# Patient Record
Sex: Female | Born: 1940 | Race: White | Hispanic: No | Marital: Married | Smoking: Never smoker
Health system: Southern US, Community
[De-identification: ages and names within clinical notes are randomized; demographics above are authoritative.]

## PROBLEM LIST (undated history)

## (undated) DIAGNOSIS — T4145XA Adverse effect of unspecified anesthetic, initial encounter: Secondary | ICD-10-CM

## (undated) DIAGNOSIS — I10 Essential (primary) hypertension: Secondary | ICD-10-CM

## (undated) DIAGNOSIS — R569 Unspecified convulsions: Secondary | ICD-10-CM

## (undated) DIAGNOSIS — R51 Headache: Secondary | ICD-10-CM

## (undated) DIAGNOSIS — K219 Gastro-esophageal reflux disease without esophagitis: Secondary | ICD-10-CM

## (undated) DIAGNOSIS — G4730 Sleep apnea, unspecified: Secondary | ICD-10-CM

## (undated) DIAGNOSIS — N189 Chronic kidney disease, unspecified: Secondary | ICD-10-CM

## (undated) DIAGNOSIS — C801 Malignant (primary) neoplasm, unspecified: Secondary | ICD-10-CM

## (undated) DIAGNOSIS — R519 Headache, unspecified: Secondary | ICD-10-CM

## (undated) DIAGNOSIS — M1990 Unspecified osteoarthritis, unspecified site: Secondary | ICD-10-CM

## (undated) DIAGNOSIS — E785 Hyperlipidemia, unspecified: Secondary | ICD-10-CM

## (undated) DIAGNOSIS — D649 Anemia, unspecified: Secondary | ICD-10-CM

## (undated) DIAGNOSIS — R52 Pain, unspecified: Secondary | ICD-10-CM

## (undated) DIAGNOSIS — E119 Type 2 diabetes mellitus without complications: Secondary | ICD-10-CM

## (undated) DIAGNOSIS — T8859XA Other complications of anesthesia, initial encounter: Secondary | ICD-10-CM

## (undated) DIAGNOSIS — M5090 Cervical disc disorder, unspecified, unspecified cervical region: Secondary | ICD-10-CM

## (undated) DIAGNOSIS — L719 Rosacea, unspecified: Secondary | ICD-10-CM

## (undated) HISTORY — PX: BACK SURGERY: SHX140

## (undated) HISTORY — PX: OTHER SURGICAL HISTORY: SHX169

## (undated) HISTORY — PX: BREAST SURGERY: SHX581

## (undated) HISTORY — PX: ABDOMINAL HYSTERECTOMY: SHX81

## (undated) HISTORY — PX: CHOLECYSTECTOMY: SHX55

## (undated) HISTORY — PX: SKIN CANCER EXCISION: SHX779

## (undated) HISTORY — DX: Type 2 diabetes mellitus without complications: E11.9

---

## 1983-08-12 HISTORY — PX: REDUCTION MAMMAPLASTY: SUR839

## 2005-04-24 ENCOUNTER — Ambulatory Visit: Payer: Self-pay | Admitting: Obstetrics and Gynecology

## 2005-07-11 ENCOUNTER — Ambulatory Visit: Payer: Self-pay | Admitting: Psychiatry

## 2005-07-16 ENCOUNTER — Ambulatory Visit: Payer: Self-pay | Admitting: Psychiatry

## 2005-07-24 ENCOUNTER — Ambulatory Visit: Payer: Self-pay | Admitting: Psychiatry

## 2006-02-02 ENCOUNTER — Emergency Department: Payer: Self-pay | Admitting: Emergency Medicine

## 2006-02-06 ENCOUNTER — Other Ambulatory Visit: Payer: Self-pay

## 2006-02-06 ENCOUNTER — Ambulatory Visit: Payer: Self-pay | Admitting: Otolaryngology

## 2006-02-12 ENCOUNTER — Ambulatory Visit: Payer: Self-pay | Admitting: Otolaryngology

## 2006-04-26 ENCOUNTER — Ambulatory Visit: Payer: Self-pay | Admitting: Psychiatry

## 2006-04-27 ENCOUNTER — Ambulatory Visit: Payer: Self-pay | Admitting: Psychiatry

## 2006-05-27 ENCOUNTER — Ambulatory Visit: Payer: Self-pay | Admitting: Obstetrics and Gynecology

## 2006-12-12 ENCOUNTER — Encounter: Admission: RE | Admit: 2006-12-12 | Discharge: 2006-12-12 | Payer: Self-pay | Admitting: Orthopedic Surgery

## 2006-12-24 ENCOUNTER — Ambulatory Visit (HOSPITAL_BASED_OUTPATIENT_CLINIC_OR_DEPARTMENT_OTHER): Admission: RE | Admit: 2006-12-24 | Discharge: 2006-12-24 | Payer: Self-pay | Admitting: Orthopedic Surgery

## 2007-07-09 ENCOUNTER — Ambulatory Visit: Payer: Self-pay | Admitting: Obstetrics and Gynecology

## 2008-03-21 ENCOUNTER — Ambulatory Visit: Payer: Self-pay | Admitting: Obstetrics and Gynecology

## 2009-07-23 ENCOUNTER — Ambulatory Visit: Payer: Self-pay | Admitting: Internal Medicine

## 2009-07-25 ENCOUNTER — Ambulatory Visit: Payer: Self-pay | Admitting: Unknown Physician Specialty

## 2009-09-11 ENCOUNTER — Ambulatory Visit: Payer: Self-pay | Admitting: Unknown Physician Specialty

## 2009-09-17 ENCOUNTER — Ambulatory Visit: Payer: Self-pay | Admitting: Unknown Physician Specialty

## 2009-10-18 ENCOUNTER — Encounter: Payer: Self-pay | Admitting: Unknown Physician Specialty

## 2009-11-09 ENCOUNTER — Encounter: Payer: Self-pay | Admitting: Unknown Physician Specialty

## 2009-12-09 ENCOUNTER — Encounter: Payer: Self-pay | Admitting: Unknown Physician Specialty

## 2010-03-04 ENCOUNTER — Ambulatory Visit: Payer: Self-pay | Admitting: Unknown Physician Specialty

## 2010-06-21 ENCOUNTER — Ambulatory Visit: Payer: Self-pay | Admitting: Cardiology

## 2010-07-29 ENCOUNTER — Ambulatory Visit: Payer: Self-pay | Admitting: Internal Medicine

## 2010-09-23 ENCOUNTER — Ambulatory Visit: Payer: Self-pay | Admitting: Unknown Physician Specialty

## 2011-07-07 ENCOUNTER — Ambulatory Visit: Payer: Self-pay | Admitting: Unknown Physician Specialty

## 2011-07-31 ENCOUNTER — Ambulatory Visit: Payer: Self-pay | Admitting: Internal Medicine

## 2011-12-31 DIAGNOSIS — M47812 Spondylosis without myelopathy or radiculopathy, cervical region: Secondary | ICD-10-CM | POA: Insufficient documentation

## 2011-12-31 DIAGNOSIS — M542 Cervicalgia: Secondary | ICD-10-CM | POA: Insufficient documentation

## 2012-03-25 DIAGNOSIS — M25519 Pain in unspecified shoulder: Secondary | ICD-10-CM | POA: Insufficient documentation

## 2012-04-27 DIAGNOSIS — M47812 Spondylosis without myelopathy or radiculopathy, cervical region: Secondary | ICD-10-CM | POA: Insufficient documentation

## 2012-09-13 ENCOUNTER — Ambulatory Visit: Payer: Self-pay | Admitting: Internal Medicine

## 2012-12-07 ENCOUNTER — Ambulatory Visit: Payer: Self-pay | Admitting: Internal Medicine

## 2012-12-09 ENCOUNTER — Ambulatory Visit: Payer: Self-pay | Admitting: Internal Medicine

## 2013-01-09 ENCOUNTER — Ambulatory Visit: Payer: Self-pay | Admitting: Internal Medicine

## 2013-11-18 DIAGNOSIS — E785 Hyperlipidemia, unspecified: Secondary | ICD-10-CM | POA: Insufficient documentation

## 2013-11-18 DIAGNOSIS — E119 Type 2 diabetes mellitus without complications: Secondary | ICD-10-CM | POA: Insufficient documentation

## 2013-11-18 DIAGNOSIS — G40909 Epilepsy, unspecified, not intractable, without status epilepticus: Secondary | ICD-10-CM | POA: Insufficient documentation

## 2013-11-18 DIAGNOSIS — G4733 Obstructive sleep apnea (adult) (pediatric): Secondary | ICD-10-CM | POA: Insufficient documentation

## 2013-11-18 DIAGNOSIS — I10 Essential (primary) hypertension: Secondary | ICD-10-CM | POA: Insufficient documentation

## 2013-12-26 DIAGNOSIS — M47817 Spondylosis without myelopathy or radiculopathy, lumbosacral region: Secondary | ICD-10-CM | POA: Insufficient documentation

## 2013-12-26 DIAGNOSIS — M169 Osteoarthritis of hip, unspecified: Secondary | ICD-10-CM | POA: Insufficient documentation

## 2013-12-26 DIAGNOSIS — M5136 Other intervertebral disc degeneration, lumbar region: Secondary | ICD-10-CM | POA: Insufficient documentation

## 2013-12-26 DIAGNOSIS — M47816 Spondylosis without myelopathy or radiculopathy, lumbar region: Secondary | ICD-10-CM | POA: Insufficient documentation

## 2014-01-13 ENCOUNTER — Ambulatory Visit: Payer: Self-pay | Admitting: Internal Medicine

## 2014-03-31 ENCOUNTER — Ambulatory Visit (INDEPENDENT_AMBULATORY_CARE_PROVIDER_SITE_OTHER): Payer: Medicare Other

## 2014-03-31 ENCOUNTER — Encounter: Payer: Self-pay | Admitting: Podiatry

## 2014-03-31 ENCOUNTER — Ambulatory Visit (INDEPENDENT_AMBULATORY_CARE_PROVIDER_SITE_OTHER): Payer: Medicare Other | Admitting: Podiatry

## 2014-03-31 ENCOUNTER — Ambulatory Visit: Payer: Self-pay | Admitting: Podiatry

## 2014-03-31 ENCOUNTER — Other Ambulatory Visit: Payer: Self-pay | Admitting: *Deleted

## 2014-03-31 VITALS — BP 128/68 | HR 48 | Resp 16 | Ht 59.0 in | Wt 146.0 lb

## 2014-03-31 DIAGNOSIS — M779 Enthesopathy, unspecified: Secondary | ICD-10-CM

## 2014-03-31 DIAGNOSIS — M19079 Primary osteoarthritis, unspecified ankle and foot: Secondary | ICD-10-CM

## 2014-03-31 DIAGNOSIS — M19071 Primary osteoarthritis, right ankle and foot: Secondary | ICD-10-CM

## 2014-03-31 MED ORDER — TRIAMCINOLONE ACETONIDE 10 MG/ML IJ SUSP
10.0000 mg | Freq: Once | INTRAMUSCULAR | Status: AC
  Administered 2014-03-31: 10 mg

## 2014-04-02 NOTE — Progress Notes (Signed)
Subjective:     Patient ID: [name] [name], female   DOB: July 09, 1941, 73 y.o.   MRN: [id number]  HPI patient points to the medial side of the right foot stating that this is been bothering me for a while and I've also noted sharp pains that at times extends into my big toe   Review of Systems     Objective:   Physical Exam  Neurovascular status intact with muscle strength adequate and discomfort at the medial malleolus around the posterior tibial tendon with inflammation and fluid buildup noted upon testing muscle strength found to be adequate and noted minimal discomfort in the right big toe    Assessment:      probable tendinitis with no current indication of muscle tear    Plan:     Reviewed condition and at this time did a careful sheath injection right posterior tibial tendon and advised on reduced activity. Reappoint her recheck if symptoms persist

## 2014-04-07 ENCOUNTER — Encounter: Payer: Self-pay | Admitting: Podiatry

## 2014-04-07 ENCOUNTER — Ambulatory Visit (INDEPENDENT_AMBULATORY_CARE_PROVIDER_SITE_OTHER): Payer: Medicare Other | Admitting: Podiatry

## 2014-04-07 DIAGNOSIS — M779 Enthesopathy, unspecified: Secondary | ICD-10-CM

## 2014-04-07 MED ORDER — TRIAMCINOLONE ACETONIDE 10 MG/ML IJ SUSP
10.0000 mg | Freq: Once | INTRAMUSCULAR | Status: AC
  Administered 2014-04-07: 10 mg

## 2014-04-07 NOTE — Progress Notes (Signed)
Subjective:     Patient ID: [name] [name], female   DOB: 04-Aug-1941, 73 y.o.   MRN: [id number]  HPI patient states the top of the foot feels a lot better but the inside of the foot bothers her some   Review of Systems     Objective:   Physical Exam Neurovascular status unchanged with inflammation just distal to the navicular insertion on the right foot with fluid buildup noted    Assessment:     Tendinitis of the right posterior tib and distal to this noted but improved    Plan:     Injected distal to the previous injection 3 mg Kenalog 5 mg Xylocaine Marcaine mixture and advised him reduced activity. Reappoint as needed

## 2014-05-23 ENCOUNTER — Ambulatory Visit: Payer: Self-pay | Admitting: Unknown Physician Specialty

## 2014-07-05 ENCOUNTER — Ambulatory Visit: Payer: Self-pay | Admitting: Unknown Physician Specialty

## 2014-09-15 ENCOUNTER — Ambulatory Visit: Payer: Self-pay | Admitting: Unknown Physician Specialty

## 2014-09-15 DIAGNOSIS — R197 Diarrhea, unspecified: Secondary | ICD-10-CM | POA: Insufficient documentation

## 2014-09-15 LAB — CLOSTRIDIUM DIFFICILE(ARMC)

## 2014-09-18 ENCOUNTER — Ambulatory Visit: Payer: Self-pay | Admitting: Unknown Physician Specialty

## 2014-09-19 ENCOUNTER — Ambulatory Visit: Payer: Self-pay | Admitting: Unknown Physician Specialty

## 2014-09-22 DIAGNOSIS — R262 Difficulty in walking, not elsewhere classified: Secondary | ICD-10-CM | POA: Insufficient documentation

## 2014-09-28 ENCOUNTER — Ambulatory Visit: Payer: Self-pay | Admitting: Pain Medicine

## 2014-10-24 ENCOUNTER — Ambulatory Visit: Payer: Self-pay

## 2014-10-24 HISTORY — PX: BLEPHAROPLASTY: SUR158

## 2015-02-07 DIAGNOSIS — M25511 Pain in right shoulder: Secondary | ICD-10-CM

## 2015-02-07 DIAGNOSIS — G8929 Other chronic pain: Secondary | ICD-10-CM | POA: Insufficient documentation

## 2015-02-28 ENCOUNTER — Other Ambulatory Visit: Payer: Self-pay | Admitting: Unknown Physician Specialty

## 2015-02-28 DIAGNOSIS — G8929 Other chronic pain: Secondary | ICD-10-CM

## 2015-02-28 DIAGNOSIS — M25511 Pain in right shoulder: Principal | ICD-10-CM

## 2015-03-06 ENCOUNTER — Ambulatory Visit
Admission: RE | Admit: 2015-03-06 | Discharge: 2015-03-06 | Disposition: A | Discharge status: Home / Self Care | Payer: Medicare Other | Source: Ambulatory Visit | Attending: Unknown Physician Specialty | Admitting: Unknown Physician Specialty

## 2015-03-06 DIAGNOSIS — M7551 Bursitis of right shoulder: Secondary | ICD-10-CM | POA: Insufficient documentation

## 2015-03-06 DIAGNOSIS — G8929 Other chronic pain: Secondary | ICD-10-CM

## 2015-03-06 DIAGNOSIS — M25511 Pain in right shoulder: Secondary | ICD-10-CM | POA: Insufficient documentation

## 2015-05-23 ENCOUNTER — Other Ambulatory Visit: Payer: Self-pay | Admitting: Physician Assistant

## 2015-05-23 DIAGNOSIS — N644 Mastodynia: Secondary | ICD-10-CM

## 2015-05-23 DIAGNOSIS — R0789 Other chest pain: Secondary | ICD-10-CM

## 2015-05-24 ENCOUNTER — Other Ambulatory Visit: Payer: Self-pay | Admitting: Physician Assistant

## 2015-05-24 DIAGNOSIS — R0789 Other chest pain: Secondary | ICD-10-CM

## 2015-05-24 DIAGNOSIS — N644 Mastodynia: Secondary | ICD-10-CM

## 2015-06-05 ENCOUNTER — Encounter
Admission: RE | Admit: 2015-06-05 | Discharge: 2015-06-05 | Disposition: A | Discharge status: Home / Self Care | Payer: Medicare Other | Source: Ambulatory Visit | Attending: Unknown Physician Specialty | Admitting: Unknown Physician Specialty

## 2015-06-05 DIAGNOSIS — N644 Mastodynia: Secondary | ICD-10-CM | POA: Diagnosis not present

## 2015-06-05 DIAGNOSIS — R0789 Other chest pain: Secondary | ICD-10-CM | POA: Diagnosis not present

## 2015-06-05 HISTORY — DX: Unspecified osteoarthritis, unspecified site: M19.90

## 2015-06-05 HISTORY — DX: Malignant (primary) neoplasm, unspecified: C80.1

## 2015-06-05 HISTORY — DX: Headache: R51

## 2015-06-05 HISTORY — DX: Other complications of anesthesia, initial encounter: T88.59XA

## 2015-06-05 HISTORY — DX: Unspecified convulsions: R56.9

## 2015-06-05 HISTORY — DX: Sleep apnea, unspecified: G47.30

## 2015-06-05 HISTORY — DX: Essential (primary) hypertension: I10

## 2015-06-05 HISTORY — DX: Headache, unspecified: R51.9

## 2015-06-05 HISTORY — DX: Anemia, unspecified: D64.9

## 2015-06-05 HISTORY — DX: Cervical disc disorder, unspecified, unspecified cervical region: M50.90

## 2015-06-05 HISTORY — DX: Adverse effect of unspecified anesthetic, initial encounter: T41.45XA

## 2015-06-05 HISTORY — DX: Gastro-esophageal reflux disease without esophagitis: K21.9

## 2015-06-05 HISTORY — DX: Pain, unspecified: R52

## 2015-06-05 HISTORY — DX: Chronic kidney disease, unspecified: N18.9

## 2015-06-05 HISTORY — DX: Rosacea, unspecified: L71.9

## 2015-06-05 NOTE — Patient Instructions (Signed)
  Your procedure is scheduled on: 06/20/15 Report to Day Surgery. [facility] To find out your arrival time please call [phone] between 1PM - 3PM on 06/19/15.  Remember: Instructions that are not followed completely may result in serious medical risk, up to and including death, or upon the discretion of your surgeon and anesthesiologist your surgery may need to be rescheduled.    _X___ 1. Do not eat food or drink liquids after midnight. No gum chewing or hard candies.     __X__ 2. No Alcohol for 24 hours before or after surgery.   ____ 3. Bring all medications with you on the day of surgery if instructed.    _X___ 4. Notify your doctor if there is any change in your medical condition     (cold, fever, infections).     Do not wear jewelry, make-up, hairpins, clips or nail polish.  Do not wear lotions, powders, or perfumes. You may wear deodorant.  Do not shave 48 hours prior to surgery. Men may shave face and neck.  Do not bring valuables to the hospital.    [facility] is not responsible for any belongings or valuables.               Contacts, dentures or bridgework may not be worn into surgery.  Leave your suitcase in the car. After surgery it may be brought to your room.  For patients admitted to the hospital, discharge time is determined by your                treatment team.   Patients discharged the day of surgery will not be allowed to drive home.   Please read over the following fact sheets that you were given:   Surgical Site Infection Prevention   ____ Take these medicines the morning of surgery with A SIP OF WATER:    1. OMEPRAZOLE  2. GABAPENTIN  3.ATENOLOL  4.  5.  6.  ____ Fleet Enema (as directed)   __X__ Use CHG Soap as directed  ____ Use inhalers on the day of surgery  ____ Stop metformin 2 days prior to surgery    ____ Take 1/2 of usual insulin dose the night before surgery and none on the morning of surgery.   ____ Stop Coumadin/Plavix/aspirin  on ____ Stop Anti-inflammatories on    ____ Stop supplements until after surgery.    _X___ Bring C-Pap to the hospital.

## 2015-06-06 ENCOUNTER — Ambulatory Visit
Admission: RE | Admit: 2015-06-06 | Discharge: 2015-06-06 | Disposition: A | Discharge status: Home / Self Care | Payer: Medicare Other | Source: Ambulatory Visit | Attending: Physician Assistant | Admitting: Physician Assistant

## 2015-06-06 DIAGNOSIS — N644 Mastodynia: Secondary | ICD-10-CM

## 2015-06-06 DIAGNOSIS — R0789 Other chest pain: Secondary | ICD-10-CM | POA: Insufficient documentation

## 2015-06-20 ENCOUNTER — Encounter: Payer: Self-pay | Admitting: *Deleted

## 2015-06-20 ENCOUNTER — Ambulatory Visit
Admission: RE | Admit: 2015-06-20 | Discharge: 2015-06-20 | Disposition: A | Discharge status: Home / Self Care | Payer: Medicare Other | Source: Ambulatory Visit | Attending: Unknown Physician Specialty | Admitting: Unknown Physician Specialty

## 2015-06-20 ENCOUNTER — Encounter
Admission: RE | Disposition: A | Discharge status: Home / Self Care | Payer: Self-pay | Source: Ambulatory Visit | Attending: Unknown Physician Specialty

## 2015-06-20 DIAGNOSIS — Z5309 Procedure and treatment not carried out because of other contraindication: Secondary | ICD-10-CM | POA: Insufficient documentation

## 2015-06-20 LAB — GLUCOSE, CAPILLARY: Glucose-Capillary: 111 mg/dL — ABNORMAL HIGH (ref 65–99)

## 2015-06-20 SURGERY — SHOULDER ARTHROSCOPY WITH ROTATOR CUFF REPAIR AND SUBACROMIAL DECOMPRESSION
Anesthesia: Choice | Laterality: Right

## 2015-06-20 MED ORDER — SODIUM CHLORIDE 0.9 % IV SOLN: INTRAVENOUS | Status: DC

## 2015-06-20 SURGICAL SUPPLY — 71 items
ADAPTER IRRIG TUBE 2 SPIKE SOL (ADAPTER) ×1 IMPLANT
ADPR TBG 2 SPK PMP STRL ASCP (ADAPTER)
ARTHROWAND PARAGON T2 (SURGICAL WAND)
BLADE ABRADER 4.5 (BLADE) ×1 IMPLANT
BLADE SHAVER 4.5X7 STR FR (MISCELLANEOUS) ×1 IMPLANT
BLADE SURG 15 STRL LF DISP TIS (BLADE) IMPLANT
BLADE SURG 15 STRL SS (BLADE)
BUR ABRADER 5.5 BLK (MISCELLANEOUS) ×1 IMPLANT
BUR BR 5.5 WIDE MOUTH (BURR) ×1 IMPLANT
BURR ABRADER 5.5 BLK (MISCELLANEOUS)
CANNULA 8.5X75 THRED (CANNULA) IMPLANT
CANNULA SHAVER 8MMX76MM (CANNULA) IMPLANT
CAP LOCK ULTRA CANNULA (MISCELLANEOUS) IMPLANT
CHLORAPREP W/TINT 26ML (MISCELLANEOUS) ×2 IMPLANT
DRAPE STERI 35X30 U-POUCH (DRAPES) ×1 IMPLANT
GAUZE SPONGE 4X4 12PLY STRL (GAUZE/BANDAGES/DRESSINGS) ×1 IMPLANT
GLOVE BIO SURGEON STRL SZ7.5 (GLOVE) ×1 IMPLANT
GLOVE BIO SURGEON STRL SZ8 (GLOVE) ×1 IMPLANT
GLOVE INDICATOR 8.0 STRL GRN (GLOVE) ×1 IMPLANT
GOWN STRL REUS W/ TWL LRG LVL3 (GOWN DISPOSABLE) ×1 IMPLANT
GOWN STRL REUS W/TWL LRG LVL3 (GOWN DISPOSABLE)
GOWN STRL REUS W/TWL LRG LVL4 (GOWN DISPOSABLE) ×1 IMPLANT
IV LACTATED RINGER IRRG 3000ML (IV SOLUTION)
IV LR IRRIG 3000ML ARTHROMATIC (IV SOLUTION) ×1 IMPLANT
KIT SHOULDER TRACTION (DRAPES) ×1 IMPLANT
MANIFOLD 4PT FOR NEPTUNE1 (MISCELLANEOUS) ×1 IMPLANT
NDL 18GX1X1/2 (RX/OR ONLY) (NEEDLE) IMPLANT
NDL MAYO CATGUT SZ 2 (NEEDLE) IMPLANT
NDL SPNL 18GX3.5 QUINCKE PK (NEEDLE) IMPLANT
NEEDLE 18GX1X1/2 (RX/OR ONLY) (NEEDLE) IMPLANT
NEEDLE MAYO CATGUT SZ 1.5 (NEEDLE)
NEEDLE MAYO CATGUT SZ 2 (NEEDLE) IMPLANT
NEEDLE SPNL 18GX3.5 QUINCKE PK (NEEDLE) IMPLANT
PACK ARTHROSCOPY SHOULDER (MISCELLANEOUS) ×1 IMPLANT
PAD GROUND ADULT SPLIT (MISCELLANEOUS) ×1 IMPLANT
PASSER SUT CAPTURE FIRST (SUTURE) IMPLANT
SET TUBE SUCT SHAVER OUTFL 24K (TUBING) ×1 IMPLANT
SOL PREP PVP 2OZ (MISCELLANEOUS)
SOLUTION PREP PVP 2OZ (MISCELLANEOUS) ×1 IMPLANT
STAPLER SKIN PROX 35W (STAPLE) IMPLANT
SUT ETHIBOND NAB CT1 #1 30IN (SUTURE) IMPLANT
SUT ETHILON 3-0 FS-10 30 BLK (SUTURE)
SUT PDS AB 1 CT1 27 (SUTURE) IMPLANT
SUT PERFECTPASSER WHITE CART (SUTURE) IMPLANT
SUT PROLENE 2 0 CT2 30 (SUTURE) IMPLANT
SUT SMART STITCH CARTRIDGE (SUTURE) IMPLANT
SUT TICRON 2-0 30IN 311381 (SUTURE) IMPLANT
SUT VIC AB 0 CT1 36 (SUTURE) IMPLANT
SUT VIC AB 0 CT2 27 (SUTURE) IMPLANT
SUT VIC AB 2-0 CT1 27 (SUTURE)
SUT VIC AB 2-0 CT1 TAPERPNT 27 (SUTURE) IMPLANT
SUT VIC AB 2-0 CT2 27 (SUTURE) IMPLANT
SUT VIC AB 2-0 SH 27 (SUTURE)
SUT VIC AB 2-0 SH 27XBRD (SUTURE) IMPLANT
SUT VIC AB 3-0 SH 27 (SUTURE)
SUT VIC AB 3-0 SH 27X BRD (SUTURE) IMPLANT
SUTURE EHLN 3-0 FS-10 30 BLK (SUTURE) IMPLANT
SUTURE MAGNUM WIRE 2X48 BLK (SUTURE) IMPLANT
SYRINGE 10CC LL (SYRINGE) IMPLANT
TAPE MICROFOAM 4IN (TAPE) ×1 IMPLANT
TUBING ARTHRO INFLOW-ONLY STRL (TUBING) ×1 IMPLANT
WAND 30 DEG SABER W/CORD (SURGICAL WAND) IMPLANT
WAND ARTHRO PARAGON T2 (SURGICAL WAND) IMPLANT
WAND COVAC 50 IFS (MISCELLANEOUS) IMPLANT
WAND COVATOR 20 (MISCELLANEOUS) IMPLANT
WAND HAND CNTRL MULTIVAC 50 (MISCELLANEOUS) IMPLANT
WAND HAND CNTRL MULTIVAC 90 (MISCELLANEOUS) IMPLANT
WAND TENDON TOPAZ 0 ANGL (MISCELLANEOUS) IMPLANT
WAND TOPAZ EPF  WAS Q (MISCELLANEOUS)
WAND TOPAZ EPF WAS Q (MISCELLANEOUS) IMPLANT
WIRE MAGNUM (SUTURE) IMPLANT

## 2015-06-20 NOTE — OR Nursing (Signed)
Patient states she had "a couple teaspoonfuls of yogurt" to take her medication this morning.  Dr. Theodore Demark was notified and in concurrence with Dr. Rosey Bath who states "needs 8 hours between" yogurt consumption before anesthesia.  Patient in room awaiting further instruction.

## 2015-06-22 ENCOUNTER — Ambulatory Visit: Payer: Medicare Other | Admitting: Anesthesiology

## 2015-06-22 ENCOUNTER — Encounter
Admission: RE | Disposition: A | Discharge status: Home / Self Care | Payer: Self-pay | Source: Ambulatory Visit | Attending: Unknown Physician Specialty

## 2015-06-22 ENCOUNTER — Encounter: Payer: Self-pay | Admitting: Anesthesiology

## 2015-06-22 ENCOUNTER — Ambulatory Visit
Admission: RE | Admit: 2015-06-22 | Discharge: 2015-06-22 | Disposition: A | Discharge status: Home / Self Care | Payer: Medicare Other | Source: Ambulatory Visit | Attending: Unknown Physician Specialty | Admitting: Unknown Physician Specialty

## 2015-06-22 DIAGNOSIS — Z09 Encounter for follow-up examination after completed treatment for conditions other than malignant neoplasm: Secondary | ICD-10-CM | POA: Diagnosis not present

## 2015-06-22 DIAGNOSIS — Z9049 Acquired absence of other specified parts of digestive tract: Secondary | ICD-10-CM | POA: Insufficient documentation

## 2015-06-22 DIAGNOSIS — G43909 Migraine, unspecified, not intractable, without status migrainosus: Secondary | ICD-10-CM | POA: Diagnosis not present

## 2015-06-22 DIAGNOSIS — Z880 Allergy status to penicillin: Secondary | ICD-10-CM | POA: Insufficient documentation

## 2015-06-22 DIAGNOSIS — Z820 Family history of epilepsy and other diseases of the nervous system: Secondary | ICD-10-CM | POA: Diagnosis not present

## 2015-06-22 DIAGNOSIS — M7521 Bicipital tendinitis, right shoulder: Secondary | ICD-10-CM | POA: Insufficient documentation

## 2015-06-22 DIAGNOSIS — Z888 Allergy status to other drugs, medicaments and biological substances status: Secondary | ICD-10-CM | POA: Diagnosis not present

## 2015-06-22 DIAGNOSIS — D649 Anemia, unspecified: Secondary | ICD-10-CM | POA: Insufficient documentation

## 2015-06-22 DIAGNOSIS — M1990 Unspecified osteoarthritis, unspecified site: Secondary | ICD-10-CM | POA: Diagnosis not present

## 2015-06-22 DIAGNOSIS — I10 Essential (primary) hypertension: Secondary | ICD-10-CM | POA: Diagnosis not present

## 2015-06-22 DIAGNOSIS — E785 Hyperlipidemia, unspecified: Secondary | ICD-10-CM | POA: Diagnosis not present

## 2015-06-22 DIAGNOSIS — M542 Cervicalgia: Secondary | ICD-10-CM | POA: Diagnosis not present

## 2015-06-22 DIAGNOSIS — Z8249 Family history of ischemic heart disease and other diseases of the circulatory system: Secondary | ICD-10-CM | POA: Diagnosis not present

## 2015-06-22 DIAGNOSIS — M7541 Impingement syndrome of right shoulder: Secondary | ICD-10-CM | POA: Diagnosis not present

## 2015-06-22 DIAGNOSIS — L719 Rosacea, unspecified: Secondary | ICD-10-CM | POA: Insufficient documentation

## 2015-06-22 DIAGNOSIS — Z79899 Other long term (current) drug therapy: Secondary | ICD-10-CM | POA: Insufficient documentation

## 2015-06-22 DIAGNOSIS — Z90710 Acquired absence of both cervix and uterus: Secondary | ICD-10-CM | POA: Insufficient documentation

## 2015-06-22 DIAGNOSIS — K219 Gastro-esophageal reflux disease without esophagitis: Secondary | ICD-10-CM | POA: Insufficient documentation

## 2015-06-22 DIAGNOSIS — E119 Type 2 diabetes mellitus without complications: Secondary | ICD-10-CM | POA: Diagnosis not present

## 2015-06-22 DIAGNOSIS — G4733 Obstructive sleep apnea (adult) (pediatric): Secondary | ICD-10-CM | POA: Insufficient documentation

## 2015-06-22 DIAGNOSIS — Z85820 Personal history of malignant melanoma of skin: Secondary | ICD-10-CM | POA: Diagnosis not present

## 2015-06-22 HISTORY — DX: Hyperlipidemia, unspecified: E78.5

## 2015-06-22 HISTORY — PX: SHOULDER ARTHROSCOPY WITH OPEN ROTATOR CUFF REPAIR: SHX6092

## 2015-06-22 SURGERY — ARTHROSCOPY, SHOULDER WITH REPAIR, ROTATOR CUFF, OPEN
Anesthesia: Regional | Site: Shoulder | Laterality: Right | Wound class: Clean

## 2015-06-22 MED ORDER — GLYCOPYRROLATE 0.2 MG/ML IJ SOLN
INTRAMUSCULAR | Status: DC | PRN
  Administered 2015-06-22: 0.1 mg via INTRAVENOUS

## 2015-06-22 MED ORDER — DEXAMETHASONE SODIUM PHOSPHATE 4 MG/ML IJ SOLN
INTRAMUSCULAR | Status: DC | PRN
  Administered 2015-06-22: 4 mg via PERINEURAL

## 2015-06-22 MED ORDER — METOCLOPRAMIDE HCL 5 MG/ML IJ SOLN
INTRAMUSCULAR | Status: DC | PRN
  Administered 2015-06-22: 10 mg via INTRAVENOUS

## 2015-06-22 MED ORDER — MIDAZOLAM HCL 5 MG/5ML IJ SOLN
INTRAMUSCULAR | Status: DC | PRN
  Administered 2015-06-22: 1 mg via INTRAVENOUS

## 2015-06-22 MED ORDER — ONDANSETRON HCL 4 MG/2ML IJ SOLN
INTRAMUSCULAR | Status: DC | PRN
  Administered 2015-06-22: 4 mg via INTRAVENOUS

## 2015-06-22 MED ORDER — LACTATED RINGERS IV SOLN
INTRAVENOUS | Status: DC | PRN
  Administered 2015-06-22: 21900 mL

## 2015-06-22 MED ORDER — PROMETHAZINE HCL 25 MG/ML IJ SOLN: 6.2500 mg | INTRAMUSCULAR | Status: DC | PRN

## 2015-06-22 MED ORDER — HYDROCODONE-ACETAMINOPHEN 5-325 MG PO TABS: 1.0000 | ORAL_TABLET | Freq: Four times a day (QID) | ORAL | Status: DC | PRN

## 2015-06-22 MED ORDER — DEXAMETHASONE SODIUM PHOSPHATE 4 MG/ML IJ SOLN: INTRAMUSCULAR | Status: DC | PRN

## 2015-06-22 MED ORDER — PROPOFOL 10 MG/ML IV BOLUS
INTRAVENOUS | Status: DC | PRN
  Administered 2015-06-22: 100 mg via INTRAVENOUS

## 2015-06-22 MED ORDER — LACTATED RINGERS IV SOLN
INTRAVENOUS | Status: DC
  Administered 2015-06-22: 07:00:00 via INTRAVENOUS

## 2015-06-22 MED ORDER — LACTATED RINGERS IV SOLN: INTRAVENOUS | Status: DC

## 2015-06-22 MED ORDER — CLINDAMYCIN PHOSPHATE 600 MG/50ML IV SOLN
INTRAVENOUS | Status: DC | PRN
  Administered 2015-06-22: 600 mg via INTRAVENOUS

## 2015-06-22 MED ORDER — FENTANYL CITRATE (PF) 100 MCG/2ML IJ SOLN
INTRAMUSCULAR | Status: DC | PRN
Start: 2015-06-22 — End: 2015-06-22
  Administered 2015-06-22: 50 ug via INTRAVENOUS

## 2015-06-22 MED ORDER — ROPIVACAINE HCL 5 MG/ML IJ SOLN
INTRAMUSCULAR | Status: DC | PRN
  Administered 2015-06-22: 200 mg via PERINEURAL

## 2015-06-22 MED ORDER — LIDOCAINE HCL (CARDIAC) 20 MG/ML IV SOLN
INTRAVENOUS | Status: DC | PRN
Start: 2015-06-22 — End: 2015-06-22
  Administered 2015-06-22: 30 mg via INTRATRACHEAL

## 2015-06-22 MED ORDER — HYDROMORPHONE HCL 1 MG/ML IJ SOLN: 0.2500 mg | INTRAMUSCULAR | Status: DC | PRN

## 2015-06-22 SURGICAL SUPPLY — 84 items
ADAPTER IRRIG TUBE 2 SPIKE SOL (ADAPTER) ×5 IMPLANT
ADPR TBG 2 SPK PMP STRL ASCP (ADAPTER) ×2
ARTHROWAND PARAGON T2 (SURGICAL WAND)
BLADE SHAVER 4.5X7 STR FR (MISCELLANEOUS) ×2 IMPLANT
BLADE SURG 15 STRL LF DISP TIS (BLADE) IMPLANT
BLADE SURG 15 STRL SS (BLADE)
BUR ABRADER 4.0 W/FLUTE AQUA (MISCELLANEOUS) IMPLANT
BUR ABRADER 5.5 BLK (MISCELLANEOUS) IMPLANT
BUR BR 5.5 12 FLUTE (BURR) IMPLANT
BUR BR 5.5 WIDE MOUTH (BURR) ×2 IMPLANT
BUR HOODED 3.0 ABRASION (BURR) IMPLANT
BUR RADIUS 4.0X18.5 (BURR) IMPLANT
BUR RADIUS 5.5 (BURR) IMPLANT
BURR ABRADER 4.0 W/FLUTE AQUA (MISCELLANEOUS) ×3
BURR ABRADER 5.5 BLK (MISCELLANEOUS) ×3
CANNULA 8.5X75 THRED (CANNULA) ×2 IMPLANT
CANNULA SHAVER 8MMX76MM (CANNULA) IMPLANT
CAP LOCK ULTRA CANNULA (MISCELLANEOUS) IMPLANT
COVER LIGHT HANDLE FLEXIBLE (MISCELLANEOUS) ×5 IMPLANT
CUTTER CANN W/HOLE 4.5 (CUTTER) IMPLANT
CUTTER SLOTTED WHISKER 4.0 (BURR) IMPLANT
DRAPE STERI 35X30 U-POUCH (DRAPES) ×3 IMPLANT
DURAPREP 26ML APPLICATOR (WOUND CARE) ×5 IMPLANT
GAUZE SPONGE 4X4 12PLY STRL (GAUZE/BANDAGES/DRESSINGS) ×3 IMPLANT
GLOVE BIO SURGEON STRL SZ7.5 (GLOVE) ×5 IMPLANT
GLOVE BIO SURGEON STRL SZ8 (GLOVE) ×3 IMPLANT
GLOVE INDICATOR 8.0 STRL GRN (GLOVE) ×5 IMPLANT
GOWN STRL REIN 2XL LVL4 (GOWN DISPOSABLE) ×3 IMPLANT
GOWN STRL REUS W/ TWL LRG LVL3 (GOWN DISPOSABLE) ×1 IMPLANT
GOWN STRL REUS W/TWL LRG LVL3 (GOWN DISPOSABLE) ×3
IV LACTATED RINGER IRRG 3000ML (IV SOLUTION) ×24
IV LR IRRIG 3000ML ARTHROMATIC (IV SOLUTION) ×1 IMPLANT
KIT ROOM TURNOVER OR (KITS) ×3 IMPLANT
KIT SHOULDER TRACTION (DRAPES) ×3 IMPLANT
MANIFOLD 4PT FOR NEPTUNE1 (MISCELLANEOUS) ×3 IMPLANT
NDL 18GX1X1/2 (RX/OR ONLY) (NEEDLE) IMPLANT
NDL MAYO CATGUT SZ 2 (NEEDLE) IMPLANT
NDL SPNL 18GX3.5 QUINCKE PK (NEEDLE) IMPLANT
NEEDLE 18GX1X1/2 (RX/OR ONLY) (NEEDLE) IMPLANT
NEEDLE MAYO CATGUT SZ 1.5 (NEEDLE)
NEEDLE MAYO CATGUT SZ 2 (NEEDLE) IMPLANT
NEEDLE SPNL 18GX3.5 QUINCKE PK (NEEDLE) IMPLANT
PACK ARTHROSCOPY SHOULDER (MISCELLANEOUS) ×3 IMPLANT
PAD GROUND ADULT SPLIT (MISCELLANEOUS) ×3 IMPLANT
PASSER SUT CAPTURE FIRST (SUTURE) IMPLANT
SET TUBE SUCT SHAVER OUTFL 24K (TUBING) ×3 IMPLANT
SLING ARM M TX990204 (SOFTGOODS) ×2 IMPLANT
SOL PREP PVP 2OZ (MISCELLANEOUS) ×3
SOLUTION PREP PVP 2OZ (MISCELLANEOUS) ×1 IMPLANT
STAPLER SKIN PROX 35W (STAPLE) IMPLANT
SUT ETHIBOND NAB CT1 #1 30IN (SUTURE) IMPLANT
SUT ETHILON 3-0 FS-10 30 BLK (SUTURE) ×3
SUT MAGNUM WIRE 2 (SUTURE) IMPLANT
SUT PDS AB 1 CT1 27 (SUTURE) IMPLANT
SUT PDSII 0 (SUTURE) IMPLANT
SUT PERFECTPASSER WHITE CART (SUTURE) IMPLANT
SUT PROLENE 2 0 CT2 30 (SUTURE) IMPLANT
SUT SMART STITCH CARTRIDGE (SUTURE) IMPLANT
SUT TICRON 2-0 30IN 311381 (SUTURE) IMPLANT
SUT VIC AB 0 CT1 36 (SUTURE) IMPLANT
SUT VIC AB 0 CT2 27 (SUTURE) IMPLANT
SUT VIC AB 2-0 CT1 27 (SUTURE)
SUT VIC AB 2-0 CT1 TAPERPNT 27 (SUTURE) IMPLANT
SUT VIC AB 2-0 CT2 27 (SUTURE) IMPLANT
SUT VIC AB 2-0 SH 27 (SUTURE)
SUT VIC AB 2-0 SH 27XBRD (SUTURE) IMPLANT
SUT VIC AB 3-0 SH 27 (SUTURE)
SUT VIC AB 3-0 SH 27X BRD (SUTURE) IMPLANT
SUTURE EHLN 3-0 FS-10 30 BLK (SUTURE) IMPLANT
SUTURE MAGNUM WIRE 2X48 BLK (SUTURE) IMPLANT
SYRINGE 10CC LL (SYRINGE) IMPLANT
TAPE MICROFOAM 4IN (TAPE) ×3 IMPLANT
TUBING ARTHRO INFLOW-ONLY STRL (TUBING) ×3 IMPLANT
WAND 30 DEG SABER W/CORD (SURGICAL WAND) IMPLANT
WAND ARTHRO PARAGON T2 (SURGICAL WAND) IMPLANT
WAND COVAC 50 IFS (MISCELLANEOUS) IMPLANT
WAND COVATOR 20 (MISCELLANEOUS) IMPLANT
WAND HAND CNTRL MULTIVAC 50 (MISCELLANEOUS) IMPLANT
WAND HAND CNTRL MULTIVAC 90 (MISCELLANEOUS) ×2 IMPLANT
WAND MEGAVAC 90 (MISCELLANEOUS) IMPLANT
WAND TENDON TOPAZ 0 ANGL (MISCELLANEOUS) IMPLANT
WAND TOPAZ EPF  WAS Q (MISCELLANEOUS)
WAND TOPAZ EPF WAS Q (MISCELLANEOUS) IMPLANT
WIRE MAGNUM (SUTURE) IMPLANT

## 2015-06-22 NOTE — Op Note (Signed)
Expand All Collapse All   03/26/2015  1:32 PM  Patient:[name], [name] Media  Pre-Op Diagnosis: Recurrent LEFT SHOULDER PAIN  Postoperative diagnosis: Recurrent impingement symptomatology R shoulder along with long head of the biceps tendinitis and AC joint arthritis  Procedure: Repeat arthroscopic subacromial decompression plus release of the long head of the biceps tendon followed by arthroscopic [facility] joint resection  Anesthesia: General endotracheal with interscalene block placed preoperatively by the anesthesiologist.  Findings: As above.   Complications: None  Estimated blood loss: negligible  Tourniquet time: None  Drains: None   Brief clinical note: The patient had a remote arthroscopic subacromial decompression plus mini incision rotator cuff repair. After the surgery the patient's symptoms had persisted despite medications, activity modification, etc. The patient's history and examination are consistent with recurrent impingement symptomatology plus long head of biceps tendinitis and AC joint arthritis. These findings were confirmed by MRI scan. The MRI scan did not show a recurrent rotator cuff tear. The patient presents at this time for definitive management of these shoulder symptoms.  Procedure: The patient was brought into the operating room and placed in the supine position. The patient then underwent general endotracheal intubation and anesthesia before being repositioned in the lateral decubitus position. The right shoulder was prepped and draped in usual fashion for shoulder procedure. The shoulder was supported with the Acufex shoulder suspension device.7 pounds of traction was utilized. Preoperative antibiotics were administered. A timeout was performed . A posterior portal was created and the glenohumeral joint thoroughly inspected revealing no degenerative arthritis and an intact but slightly frayed labrum. There was a little fraying of the labral  attachment of the long head of the biceps tendon.. An anterior portal was created. An ArthroCare wand was inserted and used to obtain hemostasis as well as to perform a limited synovectomy.The biceps tendon was evaluated and then released from its labral attachment using an ArthroCare wand.  The scope was repositioned through the posterior portal into the subacromial space. A separate lateral portal was created using an outside-in technique. An ArthroCare 90 wand followed by a 4.0 full-radius resector was introduced and used to perform a subtotal bursectomy. No obvious recurrent rotator cuff tear was appreciated. There was some residual inferior prominences on the anterior aspect of the acromion The ArthroCare wand was then inserted and used to remove the periosteal tissue off the undersurface of the anterior third of the acromion as well as to re-recess the coracoacromial ligament from its attachment along the anterior and lateral margins of the acromion. I introduced a 5.5 acromionizer through the posterior portal and used it to perform a more extensive decompression by removing the residual inferior irregular prominences of the anterior third of the acromion. I then performed a distal clavicular resection using different size burs. I had to make a superior portal to finish the clavicular resection. I did probe the rotator cuff. No recurrent tear was identified. The instruments were then removed from the subacromial space.  The portal sites were closed using 3-O nylon sutures. A sterile bulky dressing was applied to the shoulder . This was followed by a sling. The patient was then awakened, extubated, and returned to the recovery room in satisfactory condition after tolerating the procedure well.  Blood loss was negligible.

## 2015-06-22 NOTE — H&P (Signed)
  H and P reviewed. No changes. Uploaded at later date. 

## 2015-06-22 NOTE — Anesthesia Procedure Notes (Addendum)
Procedure Name: LMA Insertion Performed by: Nat Christen Pre-anesthesia Checklist: Patient identified, Emergency Drugs available, Suction available, Patient being monitored and Timeout performed Patient Re-evaluated:Patient Re-evaluated prior to inductionOxygen Delivery Method: Circle system utilized Preoxygenation: Pre-oxygenation with 100% oxygen Intubation Type: IV induction Ventilation: Mask ventilation without difficulty LMA: LMA inserted LMA Size: 4.0 Number of attempts: 1   Anesthesia Regional Block:  Interscalene brachial plexus block  Pre-Anesthetic Checklist: ,, timeout performed, Correct Patient, Correct Site, Correct Laterality, Correct Procedure, Correct Position, risks and benefits discussed,, pre-op evaluation, at surgeon's request  Laterality: Right  Prep: chloraprep       Needles:  Injection technique: Single-shot  Needle Type: Stimiplex      Needle Gauge: 21 and 21 G    Additional Needles:  Procedures: ultrasound guided (picture in chart)  Motor weakness within 5 minutes. Interscalene brachial plexus block Narrative:  Start time: 06/22/2015 7:20 AM End time: 06/22/2015 7:25 AM Injection made incrementally with aspirations every 5 mL.  Performed by: Personally  Anesthesiologist: BEACH, RACHEL B

## 2015-06-22 NOTE — Anesthesia Preprocedure Evaluation (Signed)
Anesthesia Evaluation  Patient identified by MRN, date of birth, ID band Patient awake    Reviewed: Allergy & Precautions, H&P , NPO status , Patient's Chart, lab work & pertinent test results, reviewed documented beta blocker date and time   Airway Mallampati: II  TM Distance: >3 FB Neck ROM: full    Dental no notable dental hx.    Pulmonary sleep apnea and Continuous Positive Airway Pressure Ventilation ,    Pulmonary exam normal breath sounds clear to auscultation       Cardiovascular Exercise Tolerance: Good hypertension, negative cardio ROS   Rhythm:regular Rate:Normal     Neuro/Psych negative neurological ROS  negative psych ROS   GI/Hepatic Neg liver ROS, GERD  ,  Endo/Other  negative endocrine ROSdiabetes  Renal/GU CRFRenal disease (stage 3)  negative genitourinary   Musculoskeletal   Abdominal   Peds  Hematology negative hematology ROS (+)   Anesthesia Other Findings   Reproductive/Obstetrics negative OB ROS                             Anesthesia Physical Anesthesia Plan  ASA: II  Anesthesia Plan: Regional   Post-op Pain Management:    Induction:   Airway Management Planned:   Additional Equipment:   Intra-op Plan:   Post-operative Plan:   Informed Consent: I have reviewed the patients History and Physical, chart, labs and discussed the procedure including the risks, benefits and alternatives for the proposed anesthesia with the patient or authorized representative who has indicated his/her understanding and acceptance.   Dental Advisory Given  Plan Discussed with: CRNA  Anesthesia Plan Comments:         Anesthesia Quick Evaluation

## 2015-06-22 NOTE — Anesthesia Postprocedure Evaluation (Signed)
  Anesthesia Post-op Note  Patient: [name] [name]  Procedure(s) Performed: Procedure(s) with comments: SHOULDER ARTHROSCOPY WITH SUBACROMIAL DECOMPRESSION, RELEASE OF LONG HEAD OF BICEPS TENDON, RESECTION OF DISTAL CLAVICLE (Right) - DIABETIC - diet controlled CPAP   Anesthesia type:Regional  Patient location: PACU  Post pain: Pain level controlled  Post assessment: Post-op Vital signs reviewed, Patient's Cardiovascular Status Stable, Respiratory Function Stable, Patent Airway and No signs of Nausea or vomiting  Post vital signs: Reviewed and stable  Last Vitals:  Filed Vitals:   06/22/15 1000  BP: 171/71  Pulse: 67  Temp:   Resp: 14    Level of consciousness: awake, alert  and patient cooperative  Complications: No apparent anesthesia complications

## 2015-06-22 NOTE — Progress Notes (Signed)
[place] ANMD with right, supraclavicular block. Side rails up, monitors on throughout procedure. See vital signs in flow sheet. Tolerated Procedure well.

## 2015-06-22 NOTE — Transfer of Care (Signed)
Immediate Anesthesia Transfer of Care Note  Patient: [name] [name]  Procedure(s) Performed: Procedure(s) with comments: SHOULDER ARTHROSCOPY WITH SUBACROMIAL DECOMPRESSION, RELEASE OF LONG HEAD OF BICEPS TENDON, RESECTION OF DISTAL CLAVICLE (Right) - DIABETIC - diet controlled CPAP   Patient Location: PACU  Anesthesia Type: Regional  Level of Consciousness: awake, alert  and patient cooperative  Airway and Oxygen Therapy: Patient Spontanous Breathing and Patient connected to supplemental oxygen  Post-op Assessment: Post-op Vital signs reviewed, Patient's Cardiovascular Status Stable, Respiratory Function Stable, Patent Airway and No signs of Nausea or vomiting  Post-op Vital Signs: Reviewed and stable  Complications: No apparent anesthesia complications

## 2015-06-22 NOTE — Discharge Instructions (Signed)
General Anesthesia, Adult, Care After Refer to this sheet in the next few weeks. These instructions provide you with information on caring for yourself after your procedure. Your health care provider may also give you more specific instructions. Your treatment has been planned according to current medical practices, but problems sometimes occur. Call your health care provider if you have any problems or questions after your procedure. WHAT TO EXPECT AFTER THE PROCEDURE After the procedure, it is typical to experience:  Sleepiness.  Nausea and vomiting. HOME CARE INSTRUCTIONS  For the first 24 hours after general anesthesia:  Have a responsible person with you.  Do not drive a car. If you are alone, do not take public transportation.  Do not drink alcohol.  Do not take medicine that has not been prescribed by your health care provider.  Do not sign important papers or make important decisions.  You may resume a normal diet and activities as directed by your health care provider.  Change bandages (dressings) as directed.  If you have questions or problems that seem related to general anesthesia, call the hospital and ask for the anesthetist or anesthesiologist on call. SEEK MEDICAL CARE IF:  You have nausea and vomiting that continue the day after anesthesia.  You develop a rash. SEEK IMMEDIATE MEDICAL CARE IF:   You have difficulty breathing.  You have chest pain.  You have any allergic problems.   This information is not intended to replace advice given to you by your health care provider. Make sure you discuss any questions you have with your health care provider.   Document Released: 11/03/2000 Document Revised: 08/18/2014 Document Reviewed: 11/26/2011 Elsevier Interactive Patient Education 2016 [place] Anesthesia, Care After Refer to this sheet in the next few weeks. These instructions provide you with information on caring for yourself after your  procedure. Your health care provider may also give you more specific instructions. Your treatment has been planned according to current medical practices, but problems sometimes occur. Call your health care provider if you have any problems or questions after your procedure. WHAT TO EXPECT AFTER THE PROCEDURE After the procedure, it is typical to experience:  Sleepiness.  Nausea and vomiting. HOME CARE INSTRUCTIONS  For the first 24 hours after general anesthesia:  Have a responsible person with you.  Do not drive a car. If you are alone, do not take public transportation.  Do not drink alcohol.  Do not take medicine that has not been prescribed by your health care provider.  Do not sign important papers or make important decisions.  You may resume a normal diet and activities as directed by your health care provider.  Change bandages (dressings) as directed.  If you have questions or problems that seem related to general anesthesia, call the hospital and ask for the anesthetist or anesthesiologist on call. SEEK MEDICAL CARE IF:  You have nausea and vomiting that continue the day after anesthesia.  You develop a rash. SEEK IMMEDIATE MEDICAL CARE IF:   You have difficulty breathing.  You have chest pain.  You have any allergic problems.  Document Released: 11/03/2000 Document Revised: 08/02/2013 Document Reviewed: 02/10/2013 [facility] Patient Information 2015 [place], [place]. This information is not intended to replace advice given to you by your health care provider. Make sure you discuss any questions you have with your health care provider. Diet: As you were doing prior to hospitalization   Dressing:  You may remove your dressing 3 days after surgery. Then apply waterproof  bandaids. You can shower at this time but need to change the waterproof bandaids after showering.  Activity:  Increase activity slowly as tolerated. Can drive when comfortable.    Sling: D/C when  comfortable    RTC: 1 week  Ice pack as needed   To prevent constipation: you may use a stool softener such as - Miralax (over the counter) for constipation as needed.    To prevent venous clotting Take one 81 mg. ASA tablet  2X per day for about 2 weeks post surgery.  Precautions:  If you experience chest pain or shortness of breath - call 911 immediately for transfer to the hospital emergency department!!  If you develop a fever greater that 101 F, purulent drainage from wound, increased redness or drainage from wound, or calf pain -- Call the office at [phone]                                             Follow- Up Appointment:  Please call for an appointment to be seen in 1 wk.

## 2015-06-25 ENCOUNTER — Encounter: Payer: Self-pay | Admitting: Unknown Physician Specialty

## 2015-07-26 DIAGNOSIS — M7541 Impingement syndrome of right shoulder: Secondary | ICD-10-CM | POA: Insufficient documentation

## 2015-07-31 ENCOUNTER — Ambulatory Visit: Payer: Medicare Other | Attending: Unknown Physician Specialty | Admitting: Physical Therapy

## 2015-07-31 DIAGNOSIS — R29898 Other symptoms and signs involving the musculoskeletal system: Secondary | ICD-10-CM | POA: Diagnosis present

## 2015-07-31 DIAGNOSIS — M25511 Pain in right shoulder: Secondary | ICD-10-CM | POA: Diagnosis present

## 2015-07-31 NOTE — Therapy (Addendum)
[facility] [address] [place], [address] Phone: [phone]   Fax:  [phone]  Physical Therapy Evaluation  Patient Details  Name: [name] [name] MRN: [id number] Date of Birth: 1940/11/12 Referring Provider: Caswell Corwin  Encounter Date: 07/31/2015    Past Medical History  Diagnosis Date  . Complication of anesthesia     sometimes hard to wake up  . Hypertension   . Chronic kidney disease     stage 3  . Arthritis   . Pain     chronic  . Anemia   . GERD (gastroesophageal reflux disease)   . Disc disorder of cervical region     c 2,3 herniated disc  . Headache   . Rosacea   . Cancer ([place])     melanoma  . Diabetes ([place])     diet controlled  . 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 Hyperlipemia   . Sleep apnea     cpap    Past Surgical History  Procedure Laterality Date  . Abdominal hysterectomy    . Rcr    . Breast surgery      reduction  . Reduction mammaplasty Bilateral 1985  . Blepharoplasty Bilateral 10/24/14    Dr. Loni Muse. Vickki Muff, MBSC  . Skin cancer excision      leg, back  . Cholecystectomy    . Back surgery      lumbar fusion, laminectomy  . Shoulder arthroscopy with open rotator cuff repair Right 06/22/2015    Procedure: SHOULDER ARTHROSCOPY WITH SUBACROMIAL DECOMPRESSION, RELEASE OF LONG HEAD OF BICEPS TENDON, RESECTION OF DISTAL CLAVICLE;  Surgeon: Leanor Kail, MD;  Location: [facility];  Service: Orthopedics;  Laterality: Right;  DIABETIC - diet controlled CPAP     There were no vitals filed for this visit.  Visit Diagnosis:  Right shoulder pain - Plan: PT plan of care cert/re-cert  Weakness of right upper extremity - Plan: PT plan of care cert/re-cert        Treatment this session to include: STM to right shoulder for pain control, tight musculature, trigger points.  Joint mobs in AP and distally to right shoulder grade II x 5 bouts  of 20 sec.                PT Long Term Goals - 07/31/15 1838    PT LONG TERM GOAL #1   Title Patient will improve with functional independence with Quickdash score of <45%   Baseline 61%   Time 6   Period Weeks   Status New   PT LONG TERM GOAL #2   Title Patient will report decreased pain of no more than 3/10   Baseline 9/10   Time 6   Period Weeks   Status New   PT LONG TERM GOAL #3   Title Patient will return to full range of motion and strength of 4/5 in right arm   Baseline 3/5 strength decreased range of motion.   Time 6   Status New                 G-Codes - 09-16-15 1819    Functional Assessment Tool Used QuickDash    Functional Limitation Carrying, moving and handling objects   Carrying, Moving and Handling Objects Current Status [id number]) At least 60 percent but less than 80 percent impaired, limited or restricted   Carrying, Moving and Handling Objects Goal Status (  [id number]) At least 20 percent but less than 40 percent impaired, limited or restricted       Problem List Patient Active Problem List   Diagnosis Date Noted  . Lumbosacral spondylosis 12/26/2013  . DDD (degenerative disc disease), lumbar 12/26/2013  . 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 Diabetes ([place]) 11/18/2013  . Dyslipidemia 11/18/2013  . Convulsions, epileptic ([place]) 11/18/2013  . BP (high blood pressure) 11/18/2013  . Obstructive apnea 11/18/2013  . 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 Pain in shoulder 03/25/2012  . Cervical spondylosis without myelopathy 12/31/2011  . Cervical pain 12/31/2011    Royce Macadamia, PT, MPT, GCS 09/03/2015, 6:23 PM  [facility] [address], [place], [address] Phone: [phone]   Fax:  [phone]  Name: [name] [name] MRN: [id number] Date of Birth: 06/03/1941   Late addendum: Added G-codes   Kerman Passey, PT, DPT

## 2015-08-09 ENCOUNTER — Ambulatory Visit: Payer: Medicare Other | Admitting: Physical Therapy

## 2015-08-09 DIAGNOSIS — M25511 Pain in right shoulder: Secondary | ICD-10-CM | POA: Diagnosis not present

## 2015-08-09 DIAGNOSIS — R29898 Other symptoms and signs involving the musculoskeletal system: Secondary | ICD-10-CM

## 2015-08-09 NOTE — Therapy (Signed)
[facility] [address] [place], [address] Phone: [phone]   Fax:  [phone]  Physical Therapy Treatment  Patient Details  Name: [name] [name] MRN: [id number] Date of Birth: 1940-12-23 Referring Provider: Caswell Corwin  Encounter Date: 08/09/2015      PT End of Session - 08/09/15 1142    Visit Number 2   Number of Visits 13   Date for PT Re-Evaluation 09/11/15   PT Start Time 0915   PT Stop Time 1000   PT Time Calculation (min) 45 min   Activity Tolerance Patient tolerated treatment well;No increased pain   Behavior During Therapy [facility] for tasks assessed/performed      Past Medical History  Diagnosis Date  . Complication of anesthesia     sometimes hard to wake up  . Hypertension   . Chronic kidney disease     stage 3  . Arthritis   . Pain     chronic  . Anemia   . GERD (gastroesophageal reflux disease)   . Disc disorder of cervical region     c 2,3 herniated disc  . Headache   . Rosacea   . Cancer ([place])     melanoma  . Diabetes ([place])     diet controlled  . Seizures ([place])     partial epilepsy - none 2 yrs  . Hyperlipemia   . Sleep apnea     cpap    Past Surgical History  Procedure Laterality Date  . Abdominal hysterectomy    . Rcr    . Breast surgery      reduction  . Reduction mammaplasty Bilateral 1985  . Blepharoplasty Bilateral 10/24/14    Dr. Loni Muse. Vickki Muff, MBSC  . Skin cancer excision      leg, back  . Cholecystectomy    . Back surgery      lumbar fusion, laminectomy  . Shoulder arthroscopy with open rotator cuff repair Right 06/22/2015    Procedure: SHOULDER ARTHROSCOPY WITH SUBACROMIAL DECOMPRESSION, RELEASE OF LONG HEAD OF BICEPS TENDON, RESECTION OF DISTAL CLAVICLE;  Surgeon: Leanor Kail, MD;  Location: [facility];  Service: Orthopedics;  Laterality: Right;  DIABETIC - diet controlled CPAP     There were no vitals filed for this visit.  Visit  Diagnosis:  Right shoulder pain  Weakness of right upper extremity      Subjective Assessment - 08/09/15 1136    Subjective Patient reports her shoulder pain has improved since last session.    Limitations Lifting   Patient Stated Goals TO have decreased pain and improved ability to use right arm.    Currently in Pain? Yes   Pain Score 5    Pain Location Shoulder   Pain Orientation Right   Pain Descriptors / Indicators Sore;Aching   Pain Type Surgical pain;Chronic pain   Pain Onset More than a month ago   Pain Frequency Intermittent   Multiple Pain Sites No       Treatment this session: AP joint mobs to right shoulder, grade II x 5 bouts of 30 sec.  Stretching into shoulder flexion, abduction, ER and horizontal abduction. X 5 each with end range pain reported especially in flexion.  STM to tender trigger points around shoulder complex.  Exercises to include UE ranger in flexion and arc motions x 1 min each. Cues provided for proper form. Yellow theraband exercises of shoulder retraction, shoulder extension and ER x 10 reps. Cues and set  up for each provided.           PT Education - 08/09/15 1139    Education provided Yes   Education Details new activities    Person(s) Educated Patient   Methods Explanation   Comprehension Verbalized understanding             PT Long Term Goals - 07/31/15 1838    PT LONG TERM GOAL #1   Title Patient will improve with functional independence with Quickdash score of <45%   Baseline 61%   Time 6   Period Weeks   Status New   PT LONG TERM GOAL #2   Title Patient will report decreased pain of no more than 3/10   Baseline 9/10   Time 6   Period Weeks   Status New   PT LONG TERM GOAL #3   Title Patient will return to full range of motion and strength of 4/5 in right arm   Baseline 3/5 strength decreased range of motion.   Time 6   Status New               Plan - 08/09/15 1142    Clinical Impression Statement  Patient reports improvement in pain level of right shoulder since last week. She continues to have pain with stretching and palpation and is limited by popping going on in right shoulder with movement.    Pt will benefit from skilled therapeutic intervention in order to improve on the following deficits Decreased activity tolerance;Decreased strength;Decreased range of motion;Impaired UE functional use;Pain   Rehab Potential Good   PT Frequency 2x / week   PT Duration 6 weeks   PT Treatment/Interventions Therapeutic exercise;Therapeutic activities;Manual techniques;Passive range of motion;Moist Heat   PT Next Visit Plan range of motion, manual therapy, strengthening   Consulted and Agree with Plan of Care Patient        Problem List Patient Active Problem List   Diagnosis Date Noted  . Lumbosacral spondylosis 12/26/2013  . DDD (degenerative disc disease), lumbar 12/26/2013  . 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 Diabetes ([place]) 11/18/2013  . Dyslipidemia 11/18/2013  . Convulsions, epileptic ([place]) 11/18/2013  . BP (high blood pressure) 11/18/2013  . Obstructive apnea 11/18/2013  . 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 Pain in shoulder 03/25/2012  . Cervical spondylosis without myelopathy 12/31/2011  . Cervical pain 12/31/2011    Loreda Silverio, PT, MPT, GCS 08/09/2015, 11:46 AM  [facility] [address] [place], [address] Phone: [phone]   Fax:  [phone]  Name: [name] [name] MRN: [id number] Date of Birth: Feb 13, 1941

## 2015-08-14 ENCOUNTER — Ambulatory Visit: Payer: Medicare Other | Attending: Unknown Physician Specialty | Admitting: Physical Therapy

## 2015-08-14 ENCOUNTER — Encounter: Payer: Medicare Other | Admitting: Physical Therapy

## 2015-08-14 DIAGNOSIS — R29898 Other symptoms and signs involving the musculoskeletal system: Secondary | ICD-10-CM | POA: Insufficient documentation

## 2015-08-14 DIAGNOSIS — M25511 Pain in right shoulder: Secondary | ICD-10-CM | POA: Diagnosis not present

## 2015-08-14 NOTE — Therapy (Signed)
[facility] [address], [place], [address] Phone: [phone]   Fax:  [phone]  Physical Therapy Treatment  Patient Details  Name: [name] [name] MRN: [id number] Date of Birth: 1941/04/27 Referring Provider: Caswell Corwin  Encounter Date: 08/14/2015      PT End of Session - 08/14/15 1345    Visit Number 3   Number of Visits 13   Date for PT Re-Evaluation 09/11/15   PT Start Time 1300   PT Stop Time [id number]   PT Time Calculation (min) 45 min   Activity Tolerance Patient tolerated treatment well;No increased pain   Behavior During Therapy [facility] for tasks assessed/performed      Past Medical History  Diagnosis Date  . Complication of anesthesia     sometimes hard to wake up  . Hypertension   . Chronic kidney disease     stage 3  . Arthritis   . Pain     chronic  . Anemia   . GERD (gastroesophageal reflux disease)   . Disc disorder of cervical region     c 2,3 herniated disc  . Headache   . Rosacea   . Cancer ([place])     melanoma  . Diabetes ([place])     diet controlled  . Seizures ([place])     partial epilepsy - none 2 yrs  . Hyperlipemia   . Sleep apnea     cpap    Past Surgical History  Procedure Laterality Date  . Abdominal hysterectomy    . Rcr    . Breast surgery      reduction  . Reduction mammaplasty Bilateral 1985  . Blepharoplasty Bilateral 10/24/14    Dr. Loni Muse. Vickki Muff, MBSC  . Skin cancer excision      leg, back  . Cholecystectomy    . Back surgery      lumbar fusion, laminectomy  . Shoulder arthroscopy with open rotator cuff repair Right 06/22/2015    Procedure: SHOULDER ARTHROSCOPY WITH SUBACROMIAL DECOMPRESSION, RELEASE OF LONG HEAD OF BICEPS TENDON, RESECTION OF DISTAL CLAVICLE;  Surgeon: Leanor Kail, MD;  Location: [facility];  Service: Orthopedics;  Laterality: Right;  DIABETIC - diet controlled CPAP     There were no vitals filed for this visit.  Visit  Diagnosis:  Right shoulder pain  Weakness of right upper extremity      Subjective Assessment - 08/14/15 1305    Subjective Patient her R shoulder increased in pain by Saturday after last session, improved by Sunday and Monday. She reports pain/soreness right when she first wakes up.    Limitations Lifting   Patient Stated Goals TO have decreased pain and improved ability to use right arm.    Currently in Pain? No/denies  Just irritation in her eye.       Manual Therapy  PROM to end range flexion 3 bouts x 1 minute, end range ER at side x 2 bouts of 1 minute, abduction x1 minute   ER - 45 degrees, IR - 68, Flexion actively -115 degrees  TherEx Yellow T-band x 10 repetitions of low rows (no increase in pain)   Chest press x 5 with mild AAROM from PT for 3 sets (only increase in pain when coming down to the plinth)  Mid Rows with yellow t-band x 10 repetitions (cuing for scapular retraction) -- no pain, 2 sets   Supine shoulder flexion with AAROM at end range x 10 (minimal increase in  pain at end range) --> passive stretching in scaption x 1 minute   Sidelying shoulder flexion through roughly full range x 10 repetitions (initially mild pain, after 2-3 reps decreased) 2 sets   Attempted sidelying ER x 5 (began feeling shoulder clicking/popping)  ** Patient reports no increase in symptoms upon leaving clinic**                        PT Education - 08/14/15 1345    Education provided Yes   Education Details Progression of PT/rehab protocol.   Person(s) Educated Patient   Methods Explanation;Demonstration;Handout   Comprehension Verbalized understanding;Returned demonstration             PT Long Term Goals - 07/31/15 1838    PT LONG TERM GOAL #1   Title Patient [name] improve with functional independence with Quickdash score of <45%   Baseline 61%   Time 6   Period Weeks   Status New   PT LONG TERM GOAL #2   Title Patient [name] report decreased  pain of no more than 3/10   Baseline 9/10   Time 6   Period Weeks   Status New   PT LONG TERM GOAL #3   Title Patient [name] return to full range of motion and strength of 4/5 in right arm   Baseline 3/5 strength decreased range of motion.   Time 6   Status New               Plan - 08/14/15 1513    Clinical Impression Statement Patient demonstrates near full AROM with sidelying shoulder flexion. Able to achieve roughly 115 degrees of active flexion currently. Patient notes she has had popping prior to her surgery and continues to state she has popping with sidelying ER.    Pt [name] benefit from skilled therapeutic intervention in order to improve on the following deficits Decreased activity tolerance;Decreased strength;Decreased range of motion;Impaired UE functional use;Pain   Rehab Potential Good   PT Frequency 2x / week   PT Duration 6 weeks   PT Treatment/Interventions Therapeutic exercise;Therapeutic activities;Manual techniques;Passive range of motion;Moist Heat   PT Next Visit Plan range of motion, manual therapy, strengthening. See protocol.    Consulted and Agree with Plan of Care Patient        Problem List Patient Active Problem List   Diagnosis Date Noted  . Lumbosacral spondylosis 12/26/2013  . DDD (degenerative disc disease), lumbar 12/26/2013  . 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 Diabetes ([place]) 11/18/2013  . Dyslipidemia 11/18/2013  . 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 BP (high blood pressure) 11/18/2013  . Obstructive apnea 11/18/2013  . 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 Pain in shoulder 03/25/2012  . Cervical spondylosis without myelopathy 12/31/2011  . Cervical pain 12/31/2011   Kerman Passey, PT, DPT    08/14/2015, 3:24 PM  [facility] [address], [place], [address] Phone: [phone]   Fax:  [phone]  Name: [name] [name] MRN: [id number] Date of Birth: [birth year]

## 2015-08-16 ENCOUNTER — Ambulatory Visit: Payer: Medicare Other | Admitting: Physical Therapy

## 2015-08-16 DIAGNOSIS — R29898 Other symptoms and signs involving the musculoskeletal system: Secondary | ICD-10-CM

## 2015-08-16 DIAGNOSIS — M25511 Pain in right shoulder: Secondary | ICD-10-CM

## 2015-08-16 NOTE — Therapy (Signed)
[facility] [address] [place], [address] Phone: [phone]   Fax:  [phone]  Physical Therapy Treatment  Patient Details  Name: [name] [name] MRN: [id number] Date of Birth: 1940/10/22 Referring Provider: Caswell Corwin  Encounter Date: 08/16/2015      PT End of Session - 08/16/15 1226    Visit Number 4   Number of Visits 13   Date for PT Re-Evaluation 09/11/15   PT Start Time 1030   PT Stop Time 1115   PT Time Calculation (min) 45 min   Activity Tolerance Patient tolerated treatment well   Behavior During Therapy [facility] for tasks assessed/performed      Past Medical History  Diagnosis Date  . Complication of anesthesia     sometimes hard to wake up  . Hypertension   . Chronic kidney disease     stage 3  . Arthritis   . Pain     chronic  . Anemia   . GERD (gastroesophageal reflux disease)   . Disc disorder of cervical region     c 2,3 herniated disc  . Headache   . Rosacea   . Cancer ([place])     melanoma  . Diabetes ([place])     diet controlled  . Seizures ([place])     partial epilepsy - none 2 yrs  . Hyperlipemia   . Sleep apnea     cpap    Past Surgical History  Procedure Laterality Date  . Abdominal hysterectomy    . Rcr    . Breast surgery      reduction  . Reduction mammaplasty Bilateral 1985  . Blepharoplasty Bilateral 10/24/14    Dr. Loni Muse. Vickki Muff, MBSC  . Skin cancer excision      leg, back  . Cholecystectomy    . Back surgery      lumbar fusion, laminectomy  . Shoulder arthroscopy with open rotator cuff repair Right 06/22/2015    Procedure: SHOULDER ARTHROSCOPY WITH SUBACROMIAL DECOMPRESSION, RELEASE OF LONG HEAD OF BICEPS TENDON, RESECTION OF DISTAL CLAVICLE;  Surgeon: Leanor Kail, MD;  Location: [facility];  Service: Orthopedics;  Laterality: Right;  DIABETIC - diet controlled CPAP     There were no vitals filed for this visit.  Visit Diagnosis:  Right  shoulder pain  Weakness of right upper extremity      Subjective Assessment - 08/16/15 1037    Subjective Patient reports no pain on Wednesday, however she woke up today with significant pain and soreness in her shoulder generally. Reports this has been common throughout her rehab thus far, some numbness/tingling in RUE which has also been present for some time.    Limitations Lifting   Patient Stated Goals TO have decreased pain and improved ability to use right arm.    Pain Score 8    Pain Location Shoulder   Pain Orientation Right   Pain Descriptors / Indicators Sore;Aching   Pain Type Surgical pain;Chronic pain   Pain Onset More than a month ago   Pain Frequency Intermittent      Manual therapy   Soft tissue mobilization to teres minor, infraspinatus, deltoid, levator scapulae, and upper trapezius. Multiple trigger points in the described muscle groups, after STM patient reported no pain in her shoulder.   TherEx  Sidelying flexion x 30" of repetitions (with scapular upward rotation facilitation by PT) for 3 sets (able to get to roughly 135 degrees)  PT Education - 08/16/15 1240    Education provided Yes   Education Details Discussed rehab potential, ROM and popping sensations before sx, treatment duration and progression.   Person(s) Educated Patient   Methods Explanation;Demonstration;Tactile cues   Comprehension Verbalized understanding;Returned demonstration             PT Long Term Goals - 07/31/15 1838    PT LONG TERM GOAL #1   Title Patient will improve with functional independence with Quickdash score of <45%   Baseline 61%   Time 6   Period Weeks   Status New   PT LONG TERM GOAL #2   Title Patient will report decreased pain of no more than 3/10   Baseline 9/10   Time 6   Period Weeks   Status New   PT LONG TERM GOAL #3   Title Patient will return to full range of motion and strength of 4/5 in right arm    Baseline 3/5 strength decreased range of motion.   Time 6   Status New               Plan - 08/16/15 1226    Clinical Impression Statement Patient initially presents with complaints of generalized shoulder pain and achiness, which has been common for her since her operation. She is noted to have multiple trigger points throughout peri-scapular musculature. With soft tissue mobilization her pain is dissipated, she reports no pain at the end of the session. With AAROM and scapular upward rotation and protraction facilitation she demonstrates acceptable ROM in sidelying.    Pt will benefit from skilled therapeutic intervention in order to improve on the following deficits Decreased activity tolerance;Decreased strength;Decreased range of motion;Impaired UE functional use;Pain   Rehab Potential Good   Clinical Impairments Affecting Rehab Potential 2nd operation on R shoulder.    PT Frequency 2x / week   PT Duration 6 weeks   PT Treatment/Interventions Therapeutic exercise;Therapeutic activities;Manual techniques;Passive range of motion;Moist Heat   PT Next Visit Plan range of motion, manual therapy, strengthening. See protocol.    Consulted and Agree with Plan of Care Patient        Problem List Patient Active Problem List   Diagnosis Date Noted  . Lumbosacral spondylosis 12/26/2013  . DDD (degenerative disc disease), lumbar 12/26/2013  . 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 Diabetes ([place]) 11/18/2013  . Dyslipidemia 11/18/2013  . 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 BP (high blood pressure) 11/18/2013  . Obstructive apnea 11/18/2013  . 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 Pain in shoulder 03/25/2012  . Cervical spondylosis without myelopathy 12/31/2011  . Cervical pain 12/31/2011    Kerman Passey, PT, DPT    08/16/2015, 12:47 PM  [facility] [address], [place], [address] Phone: [phone]   Fax:  [phone]  Name: [name] [name] MRN: [id number] Date of Birth: 09/20/1940

## 2015-08-21 ENCOUNTER — Encounter: Payer: Medicare Other | Admitting: Physical Therapy

## 2015-08-23 ENCOUNTER — Ambulatory Visit: Payer: Medicare Other | Admitting: Physical Therapy

## 2015-08-23 DIAGNOSIS — R29898 Other symptoms and signs involving the musculoskeletal system: Secondary | ICD-10-CM

## 2015-08-23 DIAGNOSIS — M25511 Pain in right shoulder: Secondary | ICD-10-CM | POA: Diagnosis not present

## 2015-08-23 NOTE — Therapy (Signed)
[facility] [address] [place], [address] Phone: [phone]   Fax:  [phone]  Physical Therapy Treatment  Patient Details  Name: CORYN RESENDES MRN: [id number] Date of Birth: 1941-05-15 Referring Provider: Caswell Corwin  Encounter Date: 08/23/2015      PT End of Session - 08/23/15 1257    Visit Number 5   Number of Visits 13   Date for PT Re-Evaluation 09/11/15   PT Start Time [id number]   PT Stop Time 1114   PT Time Calculation (min) 38 min   Activity Tolerance Patient tolerated treatment well   Behavior During Therapy [facility] for tasks assessed/performed      Past Medical History  Diagnosis Date  . Complication of anesthesia     sometimes hard to wake up  . Hypertension   . Chronic kidney disease     stage 3  . Arthritis   . Pain     chronic  . Anemia   . GERD (gastroesophageal reflux disease)   . Disc disorder of cervical region     c 2,3 herniated disc  . Headache   . Rosacea   . Cancer ([place])     melanoma  . Diabetes ([place])     diet controlled  . Seizures ([place])     partial epilepsy - none 2 yrs  . Hyperlipemia   . Sleep apnea     cpap    Past Surgical History  Procedure Laterality Date  . Abdominal hysterectomy    . Rcr    . Breast surgery      reduction  . Reduction mammaplasty Bilateral 1985  . Blepharoplasty Bilateral 10/24/14    Dr. Loni Muse. Vickki Muff, MBSC  . Skin cancer excision      leg, back  . Cholecystectomy    . Back surgery      lumbar fusion, laminectomy  . Shoulder arthroscopy with open rotator cuff repair Right 06/22/2015    Procedure: SHOULDER ARTHROSCOPY WITH SUBACROMIAL DECOMPRESSION, RELEASE OF LONG HEAD OF BICEPS TENDON, RESECTION OF DISTAL CLAVICLE;  Surgeon: Leanor Kail, MD;  Location: [facility];  Service: Orthopedics;  Laterality: Right;  DIABETIC - diet controlled CPAP     There were no vitals filed for this visit.  Visit Diagnosis:  Right  shoulder pain  Weakness of right upper extremity      Subjective Assessment - 08/23/15 1037    Subjective Patient reports minimal pain today, she had more pain on Tuesday (she used the TENS unit). She reports she has not had pain similar to last session since that treatment session.    Limitations Lifting   Patient Stated Goals TO have decreased pain and improved ability to use right arm.    Currently in Pain? Yes   Pain Score 5    Pain Location Shoulder   Pain Orientation Right   Pain Descriptors / Indicators Aching;Sore   Pain Type Surgical pain;Chronic pain   Pain Onset More than a month ago   Pain Frequency Intermittent      Supine flexion actively to 136 degrees, 5 repetitions through pain free range   End range stretching passively in supine into flexion, ER at ~ 30 degrees abduction, and horizontal adduction x 30" bouts at each (no pain in any direction)   Scapular retractions x 10 for 2 sets   Gentle isometric contraction of ERs/IRs at side isometrically x 10 repetitions (one twinge of pain with IR at rep  5 of 2nd set, no further pain).  AAROM Chest press 2 sets x 10 repetitions (one small "stab" sensation, afterwards no pain).   Supine passive stretching through pain free range x 30"  Sidelying shoulder flexion to 138 degrees (no pain) x 7 repetitions for 2 sets   ** Patient reports decreased pain and at times no pain while leaving session.                            PT Education - 08/23/15 1406    Education provided Yes   Education Details Progression of protocol and added sidelying shoulder flexion to HEP. Increase in ROM with therapy.    Person(s) Educated Patient   Methods Explanation;Demonstration;Handout;Verbal cues   Comprehension Verbalized understanding;Returned demonstration             PT Long Term Goals - 07/31/15 1838    PT LONG TERM GOAL #1   Title Patient will improve with functional independence with Quickdash score  of <45%   Baseline 61%   Time 6   Period Weeks   Status New   PT LONG TERM GOAL #2   Title Patient will report decreased pain of no more than 3/10   Baseline 9/10   Time 6   Period Weeks   Status New   PT LONG TERM GOAL #3   Title Patient will return to full range of motion and strength of 4/5 in right arm   Baseline 3/5 strength decreased range of motion.   Time 6   Status New               Plan - 08/23/15 1258    Clinical Impression Statement Patient demonstrates continued increase in AROM and PROM of shoulder flexion with minimal complaints of pain. She completes gentle isometric shoulder exercises per protocol with minor complaint of increased pain with one repetition, which reduced quickly.    Pt will benefit from skilled therapeutic intervention in order to improve on the following deficits Decreased activity tolerance;Decreased strength;Decreased range of motion;Impaired UE functional use;Pain   Rehab Potential Good   Clinical Impairments Affecting Rehab Potential 2nd operation on R shoulder.    PT Frequency 2x / week   PT Duration 6 weeks   PT Treatment/Interventions Therapeutic exercise;Therapeutic activities;Manual techniques;Passive range of motion;Moist Heat   PT Next Visit Plan range of motion, manual therapy, strengthening. See protocol.    Consulted and Agree with Plan of Care Patient        Problem List Patient Active Problem List   Diagnosis Date Noted  . Lumbosacral spondylosis 12/26/2013  . DDD (degenerative disc disease), lumbar 12/26/2013  . 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 Diabetes ([place]) 11/18/2013  . Dyslipidemia 11/18/2013  . 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 BP (high blood pressure) 11/18/2013  . Obstructive apnea 11/18/2013  . 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 Pain in shoulder 03/25/2012  . Cervical spondylosis without myelopathy 12/31/2011  . Cervical pain 12/31/2011    Kerman Passey,  PT, DPT    08/23/2015, 2:09 PM  [facility] [address] [place], [address] Phone: [phone]   Fax:  [phone]  Name: JAHNAVI CISCO MRN: [id number] Date of Birth: March 10, 1941

## 2015-08-24 DIAGNOSIS — G5701 Lesion of sciatic nerve, right lower limb: Secondary | ICD-10-CM | POA: Insufficient documentation

## 2015-08-28 ENCOUNTER — Ambulatory Visit: Payer: Medicare Other | Admitting: Physical Therapy

## 2015-08-28 DIAGNOSIS — M25511 Pain in right shoulder: Secondary | ICD-10-CM

## 2015-08-28 DIAGNOSIS — R29898 Other symptoms and signs involving the musculoskeletal system: Secondary | ICD-10-CM

## 2015-08-28 NOTE — Therapy (Signed)
[facility] [address] [place], [address] Phone: [phone]   Fax:  [phone]  Physical Therapy Treatment  Patient Details  Name: [name] [name] MRN: [id number] Date of Birth: 12/29/1940 Referring Provider: Caswell Corwin  Encounter Date: 08/28/2015      PT End of Session - 08/28/15 1731    Visit Number 6   Number of Visits 13   Date for PT Re-Evaluation 09/11/15   PT Start Time [id number]   PT Stop Time 1620   PT Time Calculation (min) 49 min   Activity Tolerance Patient tolerated treatment well   Behavior During Therapy [facility] for tasks assessed/performed      Past Medical History  Diagnosis Date  . Complication of anesthesia     sometimes hard to wake up  . Hypertension   . Chronic kidney disease     stage 3  . Arthritis   . Pain     chronic  . Anemia   . GERD (gastroesophageal reflux disease)   . Disc disorder of cervical region     c 2,3 herniated disc  . Headache   . Rosacea   . Cancer ([place])     melanoma  . Diabetes ([place])     diet controlled  . Seizures ([place])     partial epilepsy - none 2 yrs  . Hyperlipemia   . Sleep apnea     cpap    Past Surgical History  Procedure Laterality Date  . Abdominal hysterectomy    . Rcr    . Breast surgery      reduction  . Reduction mammaplasty Bilateral 1985  . Blepharoplasty Bilateral 10/24/14    Dr. Loni Muse. Vickki Muff, MBSC  . Skin cancer excision      leg, back  . Cholecystectomy    . Back surgery      lumbar fusion, laminectomy  . Shoulder arthroscopy with open rotator cuff repair Right 06/22/2015    Procedure: SHOULDER ARTHROSCOPY WITH SUBACROMIAL DECOMPRESSION, RELEASE OF LONG HEAD OF BICEPS TENDON, RESECTION OF DISTAL CLAVICLE;  Surgeon: Leanor Kail, MD;  Location: [facility];  Service: Orthopedics;  Laterality: Right;  DIABETIC - diet controlled CPAP     There were no vitals filed for this visit.  Visit Diagnosis:  Right  shoulder pain  Weakness of right upper extremity      Subjective Assessment - 08/28/15 1538    Subjective Patient reports she has noticed improved symptoms since her last session. She reports some days with no pain, other days with minimal complaints.    Limitations Lifting   Patient Stated Goals TO have decreased pain and improved ability to use right arm.    Currently in Pain? No/denies         AROM in scaption in standing with RUE - 140 degrees (became painful), LUE - 152 degrees in scaption.   Sidelying shoulder flexion 2 sets x 10 repetitions (no increase in pain)   Isometric IR, ER, scapular retraction/humeral extension all with arm at her side x 10 against light manual treatment. 2 sets on each, mild pain on 1-2 repetitions total, dissipated with subsequent repetitions.   Standing 3-point rows with bodyweight with cuing for  Scapular retraction x 10 for 2 sets (not challenging, popped on 1st attempt with 2 sets -- no pain)  Chest press with PVC with 1.5# ankle weight on x 10, PT guiding for proper technique. No increase in pain, with 2# ankle  weight on PVC pipe x 10 repetitions with no increase in symptoms.   Yellow t-band scapular retractions with PT allowing some slack 2 sets x 10 repetitions. Patient noted to have pop in her shoulder consistently with retraction.   ER - 75 degrees, IR - 80 degrees                           PT Education - 08/28/15 1730    Education provided Yes   Education Details Educated patient on progress with ROM and protocol progression.    Person(s) Educated Patient   Methods Explanation;Demonstration   Comprehension Verbalized understanding;Returned demonstration             PT Long Term Goals - 07/31/15 1838    PT LONG TERM GOAL #1   Title Patient will improve with functional independence with Quickdash score of <45%   Baseline 61%   Time 6   Period Weeks   Status New   PT LONG TERM GOAL #2   Title Patient  will report decreased pain of no more than 3/10   Baseline 9/10   Time 6   Period Weeks   Status New   PT LONG TERM GOAL #3   Title Patient will return to full range of motion and strength of 4/5 in right arm   Baseline 3/5 strength decreased range of motion.   Time 6   Status New               Plan - 08/28/15 1731    Clinical Impression Statement Patient demonstrates near full AROM in scaption of RUE (compared to LUE), with no complaints of pain throughout the ROM. She is able to tolerate light resistance exercises per protocol with no complaints of pain during exercises. She continues to have audible pop with scapular retraction/humeral extension though this appears to have been present prior to the operation.    Pt will benefit from skilled therapeutic intervention in order to improve on the following deficits Decreased activity tolerance;Decreased strength;Decreased range of motion;Impaired UE functional use;Pain   Rehab Potential Good   Clinical Impairments Affecting Rehab Potential 2nd operation on R shoulder.    PT Frequency 2x / week   PT Duration 6 weeks   PT Treatment/Interventions Therapeutic exercise;Therapeutic activities;Manual techniques;Passive range of motion;Moist Heat   PT Next Visit Plan range of motion, manual therapy, strengthening. See protocol.    Consulted and Agree with Plan of Care Patient        Problem List Patient Active Problem List   Diagnosis Date Noted  . Lumbosacral spondylosis 12/26/2013  . DDD (degenerative disc disease), lumbar 12/26/2013  . 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 Diabetes ([place]) 11/18/2013  . Dyslipidemia 11/18/2013  . Convulsions, epileptic ([place]) 11/18/2013  . BP (high blood pressure) 11/18/2013  . Obstructive apnea 11/18/2013  . 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 Pain in shoulder 03/25/2012  . Cervical spondylosis without myelopathy 12/31/2011  . Cervical pain 12/31/2011   Kerman Passey, PT, DPT    08/28/2015, 5:36 PM  [facility] [address], [place], [address] Phone: [phone]   Fax:  [phone]  Name: [name] [name] MRN: [id number] Date of Birth: [birth year]

## 2015-08-30 ENCOUNTER — Ambulatory Visit: Payer: Medicare Other | Admitting: Physical Therapy

## 2015-08-30 DIAGNOSIS — M25511 Pain in right shoulder: Secondary | ICD-10-CM | POA: Diagnosis not present

## 2015-08-30 DIAGNOSIS — R29898 Other symptoms and signs involving the musculoskeletal system: Secondary | ICD-10-CM

## 2015-08-30 NOTE — Therapy (Signed)
[facility] [address] [place], [address] Phone: [phone]   Fax:  [phone]  Physical Therapy Treatment  Patient Details  Name: [name] [name] MRN: [id number] Date of Birth: 07-30-1941 Referring Provider: Caswell Corwin  Encounter Date: 08/30/2015      PT End of Session - 08/30/15 1302    Visit Number 7   Number of Visits 13   Date for PT Re-Evaluation 09/11/15   PT Start Time [id number]   PT Stop Time 1115   PT Time Calculation (min) 30 min   Activity Tolerance Patient tolerated treatment well   Behavior During Therapy [facility] for tasks assessed/performed      Past Medical History  Diagnosis Date  . Complication of anesthesia     sometimes hard to wake up  . Hypertension   . Chronic kidney disease     stage 3  . Arthritis   . Pain     chronic  . Anemia   . GERD (gastroesophageal reflux disease)   . Disc disorder of cervical region     c 2,3 herniated disc  . Headache   . Rosacea   . Cancer ([place])     melanoma  . Diabetes ([place])     diet controlled  . Seizures ([place])     partial epilepsy - none 2 yrs  . Hyperlipemia   . Sleep apnea     cpap    Past Surgical History  Procedure Laterality Date  . Abdominal hysterectomy    . Rcr    . Breast surgery      reduction  . Reduction mammaplasty Bilateral 1985  . Blepharoplasty Bilateral 10/24/14    Dr. Loni Muse. Vickki Muff, MBSC  . Skin cancer excision      leg, back  . Cholecystectomy    . Back surgery      lumbar fusion, laminectomy  . Shoulder arthroscopy with open rotator cuff repair Right 06/22/2015    Procedure: SHOULDER ARTHROSCOPY WITH SUBACROMIAL DECOMPRESSION, RELEASE OF LONG HEAD OF BICEPS TENDON, RESECTION OF DISTAL CLAVICLE;  Surgeon: Leanor Kail, MD;  Location: [facility];  Service: Orthopedics;  Laterality: Right;  DIABETIC - diet controlled CPAP     There were no vitals filed for this visit.  Visit Diagnosis:  Right  shoulder pain  Weakness of right upper extremity      Subjective Assessment - 08/30/15 1041    Subjective Patient reports some soreness today from activity performed in previous session. She is able to complete near full AROM with minimal increase in pain.    Limitations Lifting   Patient Stated Goals TO have decreased pain and improved ability to use right arm.    Currently in Pain? --  Reports today she is a little sore but is more concerned with neck and R hip pain        AROM ~ 160 degrees in RUE today   ER rotation stretching at ~ 20 degrees abduction 2 bouts x 30"   Horizontal adduction stretching 2 bouts x 30"   IR/ER isometric contractions at 0 degrees abduction 2 sets x 10 repetitions   Wand chest press with scapular punch with PVC pipe and 1.5# ankle weight 1 set x 10 with cuing for technique.   3-point row with 2# DB 3 sets x 10 repetitions with 3" holds at the top and halfway down eccentrically with cuing for technique and posture.  PT Education - 08/30/15 1302    Education provided Yes   Education Details Educated patient on need to begin strength and resistance progression.    Person(s) Educated Patient   Methods Explanation;Demonstration;Verbal cues;Tactile cues   Comprehension Verbalized understanding;Returned demonstration             PT Long Term Goals - 07/31/15 1838    PT LONG TERM GOAL #1   Title Patient will improve with functional independence with Quickdash score of <45%   Baseline 61%   Time 6   Period Weeks   Status New   PT LONG TERM GOAL #2   Title Patient will report decreased pain of no more than 3/10   Baseline 9/10   Time 6   Period Weeks   Status New   PT LONG TERM GOAL #3   Title Patient will return to full range of motion and strength of 4/5 in right arm   Baseline 3/5 strength decreased range of motion.   Time 6   Status New               Plan - 08/30/15 1303     Clinical Impression Statement Patient now displays roughly 160 degrees of active R shoulder flexion with pain at end range only now. Patient tolerates progression of strengthening activities in this session, with decreased pain at the end of the session. Patient needs to progress with strengthening and begin loading tissue of RUE.    Pt will benefit from skilled therapeutic intervention in order to improve on the following deficits Decreased activity tolerance;Decreased strength;Decreased range of motion;Impaired UE functional use;Pain   Rehab Potential Good   Clinical Impairments Affecting Rehab Potential 2nd operation on R shoulder.    PT Frequency 2x / week   PT Duration 6 weeks   PT Treatment/Interventions Therapeutic exercise;Therapeutic activities;Manual techniques;Passive range of motion;Moist Heat   PT Next Visit Plan Strengthening within protocol limitations.    Consulted and Agree with Plan of Care Patient        Problem List Patient Active Problem List   Diagnosis Date Noted  . Lumbosacral spondylosis 12/26/2013  . DDD (degenerative disc disease), lumbar 12/26/2013  . 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 Diabetes ([place]) 11/18/2013  . Dyslipidemia 11/18/2013  . 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 BP (high blood pressure) 11/18/2013  . Obstructive apnea 11/18/2013  . 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 Pain in shoulder 03/25/2012  . Cervical spondylosis without myelopathy 12/31/2011  . Cervical pain 12/31/2011   Kerman Passey, PT, DPT    08/30/2015, 4:54 PM  [facility] [address], [place], [address] Phone: [phone]   Fax:  [phone]  Name: [name] [name] MRN: [id number] Date of Birth: Dec 01, 1940

## 2015-09-04 ENCOUNTER — Ambulatory Visit: Payer: Medicare Other | Admitting: Physical Therapy

## 2015-09-04 DIAGNOSIS — R29898 Other symptoms and signs involving the musculoskeletal system: Secondary | ICD-10-CM

## 2015-09-04 DIAGNOSIS — M25511 Pain in right shoulder: Secondary | ICD-10-CM | POA: Diagnosis not present

## 2015-09-05 NOTE — Therapy (Signed)
[facility] [address], [place], [address] Phone: [phone]   Fax:  [phone]  Physical Therapy Treatment  Patient Details  Name: [name] [name] MRN: [id number] Date of Birth: Dec 02, 1940 Referring Provider: Caswell Corwin  Encounter Date: 09/04/2015      PT End of Session - 09/04/15 1810    Visit Number 8   Number of Visits 13   Date for PT Re-Evaluation 10/02/15   PT Start Time 6237   PT Stop Time 1800   PT Time Calculation (min) 50 min   Activity Tolerance Patient tolerated treatment well   Behavior During Therapy [facility] for tasks assessed/performed      Past Medical History  Diagnosis Date  . Complication of anesthesia     sometimes hard to wake up  . Hypertension   . Chronic kidney disease     stage 3  . Arthritis   . Pain     chronic  . Anemia   . GERD (gastroesophageal reflux disease)   . Disc disorder of cervical region     c 2,3 herniated disc  . Headache   . Rosacea   . Cancer ([place])     melanoma  . Diabetes ([place])     diet controlled  . Seizures ([place])     partial epilepsy - none 2 yrs  . Hyperlipemia   . Sleep apnea     cpap    Past Surgical History  Procedure Laterality Date  . Abdominal hysterectomy    . Rcr    . Breast surgery      reduction  . Reduction mammaplasty Bilateral 1985  . Blepharoplasty Bilateral 10/24/14    Dr. Loni Muse. Vickki Muff, MBSC  . Skin cancer excision      leg, back  . Cholecystectomy    . Back surgery      lumbar fusion, laminectomy  . Shoulder arthroscopy with open rotator cuff repair Right 06/22/2015    Procedure: SHOULDER ARTHROSCOPY WITH SUBACROMIAL DECOMPRESSION, RELEASE OF LONG HEAD OF BICEPS TENDON, RESECTION OF DISTAL CLAVICLE;  Surgeon: Leanor Kail, MD;  Location: [facility];  Service: Orthopedics;  Laterality: Right;  DIABETIC - diet controlled CPAP     There were no vitals filed for this visit.  Visit Diagnosis:  Right  shoulder pain  Weakness of right upper extremity      Subjective Assessment - 09/04/15 1713    Subjective Patient reports her shoulder was hurting both Saturday and Sunday over the weekend, it has since decreased Monday and now today she reports no pain. She does not report any mechanism of increase symptoms or any change in activity.    Limitations Lifting   Patient Stated Goals TO have decreased pain and improved ability to use right arm.    Currently in Pain? No/denies      Seated ERs with RUE 2 sets x 10 repetitions  STM to upper trapezius, infraspinatus, supraspinatus, and teres minor with relief of pain with flexion afterwards. At initial presentation 145 degrees of flexion (pain limited).  3-point rows with 2#, with 3# for 10 repetitions -- no increase in pain. Cued for posture, technique without shrugging.   Resisted isometrics at 0 degrees abduction for IR/ER 2 sets x10 repetitions with 3-5" holds and no increase in pain bilaterally.   Bent over reverse flyes (horizontal abduction) with bodyweight x 10, with 1# DB x 10 for 2 sets.   Mid Rows with red t-band x  10 for 3 sets (no pain) -- appropriate peri-scapular activation   Low Rows with red t-band x 10 for 2 sets with cuing for keeping elbow straight, appropriately activating LTs with manual cuing.  160 degrees of shoulder flexion (within 2-5 degrees of LUE) after session.                            PT Education - September 12, 2015 1811    Education provided Yes   Education Details Educated patient on shrugging being a likely cause of her symptoms over the weekend. Instructed patient to complete band resisted exercises only over the week.    Person(s) Educated Patient   Methods Explanation;Demonstration;Tactile cues;Verbal cues;Handout   Comprehension Returned demonstration;Verbalized understanding             PT Long Term Goals - 12-Sep-2015 1808    PT LONG TERM GOAL #1   Title Patient will improve  with functional independence with Quickdash score of <45%   Baseline 61% at baseline, on 2015/09/12 - 50%    Time 6   Period Weeks   Status Partially Met   PT LONG TERM GOAL #2   Title Patient will report decreased pain of no more than 3/10   Baseline 9/10, 5 of 7 days in past week was less than 3/10 2 of 7 > 5/10   Time 6   Period Weeks   Status Partially Met   PT LONG TERM GOAL #3   Title Patient will return to full range of motion and strength of 4/5 in right arm   Baseline 3/5 strength decreased range of motion at baseline. On 11-Sep-2022  able to complete 160 degrees active flexion, unable to complete strength testing based on protocol.    Time 6   Status Partially Met               Plan - 09-12-15 1810    Clinical Impression Statement Patient reports increased pain over the weekend, it appears she attempted to complete row exercise (not advised by PT) while shrugging her shoulder. This pain subsided and today she reports pain only with end range flexion or IR. She is tolerating increased weight with exercises in this session and displays excellent mobility and function currently. Patient would benefit from additional strengthening to ensure successful outcome of operation.    Pt will benefit from skilled therapeutic intervention in order to improve on the following deficits Decreased activity tolerance;Decreased strength;Decreased range of motion;Impaired UE functional use;Pain   Rehab Potential Good   Clinical Impairments Affecting Rehab Potential 2nd operation on R shoulder.    PT Frequency 2x / week   PT Duration 6 weeks   PT Treatment/Interventions Therapeutic exercise;Therapeutic activities;Manual techniques;Passive range of motion;Moist Heat   PT Next Visit Plan Strengthening within protocol limitations.    Consulted and Agree with Plan of Care Patient          G-Codes - 09/12/15 1809    Functional Assessment Tool Used QuickDash    Functional Limitation Carrying, moving  and handling objects   Carrying, Moving and Handling Objects Current Status [phone]) At least 40 percent but less than 60 percent impaired, limited or restricted   Carrying, Moving and Handling Objects Goal Status ([id number]) At least 20 percent but less than 40 percent impaired, limited or restricted      Problem List Patient Active Problem List   Diagnosis Date Noted  . Lumbosacral spondylosis 12/26/2013  . DDD (  degenerative disc disease), lumbar 12/26/2013  . 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 Diabetes ([place]) 11/18/2013  . Dyslipidemia 11/18/2013  . Convulsions, epileptic ([place]) 11/18/2013  . BP (high blood pressure) 11/18/2013  . Obstructive apnea 11/18/2013  . 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 Pain in shoulder 03/25/2012  . Cervical spondylosis without myelopathy 12/31/2011  . Cervical pain 12/31/2011    Kerman Passey, PT, DPT    09/05/2015, 6:46 PM  [facility] [address] [place], [address] Phone: [phone]   Fax:  [phone]  Name: AYDE RECORD MRN: [id number] Date of Birth: 07/04/1941

## 2015-09-06 ENCOUNTER — Ambulatory Visit: Payer: Medicare Other | Admitting: Physical Therapy

## 2015-09-06 DIAGNOSIS — M25511 Pain in right shoulder: Secondary | ICD-10-CM | POA: Diagnosis not present

## 2015-09-06 DIAGNOSIS — R29898 Other symptoms and signs involving the musculoskeletal system: Secondary | ICD-10-CM

## 2015-09-06 NOTE — Therapy (Signed)
[place] [facility] [address], [place], [address] Phone: [phone]   Fax:  [phone]  Physical Therapy Treatment  Patient Details  Name: [name] [name] MRN: [id number] Date of Birth: 24-Apr-1941 Referring Provider: Caswell Corwin  Encounter Date: 09/06/2015      PT End of Session - 09/06/15 1046    Visit Number 9   Number of Visits 13   Date for PT Re-Evaluation 10/02/15   PT Start Time 0952   PT Stop Time 1037   PT Time Calculation (min) 45 min   Activity Tolerance Patient tolerated treatment well   Behavior During Therapy [facility] for tasks assessed/performed      Past Medical History  Diagnosis Date  . Complication of anesthesia     sometimes hard to wake up  . Hypertension   . Chronic kidney disease     stage 3  . Arthritis   . Pain     chronic  . Anemia   . GERD (gastroesophageal reflux disease)   . Disc disorder of cervical region     c 2,3 herniated disc  . Headache   . Rosacea   . Cancer ([place])     melanoma  . Diabetes ([place])     diet controlled  . Seizures ([place])     partial epilepsy - none 2 yrs  . Hyperlipemia   . Sleep apnea     cpap    Past Surgical History  Procedure Laterality Date  . Abdominal hysterectomy    . Rcr    . Breast surgery      reduction  . Reduction mammaplasty Bilateral 1985  . Blepharoplasty Bilateral 10/24/14    Dr. Loni Muse. Vickki Muff, MBSC  . Skin cancer excision      leg, back  . Cholecystectomy    . Back surgery      lumbar fusion, laminectomy  . Shoulder arthroscopy with open rotator cuff repair Right 06/22/2015    Procedure: SHOULDER ARTHROSCOPY WITH SUBACROMIAL DECOMPRESSION, RELEASE OF LONG HEAD OF BICEPS TENDON, RESECTION OF DISTAL CLAVICLE;  Surgeon: Leanor Kail, MD;  Location: [place];  Service: Orthopedics;  Laterality: Right;  DIABETIC - diet controlled CPAP     There were no vitals filed for this visit.  Visit Diagnosis:  Right  shoulder pain  Weakness of right upper extremity      Subjective Assessment - 09/06/15 0956    Subjective Patient reports she felt great yesterday and is in more pain today. She reports moderate satisfaction with her rehab thus far, and recognizes now she will have good days and bad days and reports she is ok with that.    Limitations Lifting   Patient Stated Goals TO have decreased pain and improved ability to use right arm.    Currently in Pain? Yes   Pain Score 8    Pain Location Shoulder   Pain Orientation Right   Pain Descriptors / Indicators Aching;Sore   Pain Type Chronic pain;Surgical pain   Pain Onset More than a month ago   Pain Frequency Intermittent        Soft tissue mobilization in upper trapezius, rhomboids, infraspinatus with complete resolution of pain symptoms afterwards.   Sit to stand x 5, 5, 15 (fatigue noted at rep 15) (for general conditioning/LE strength/stability critical for overhead function)   Chest press with 2# on PVC pipe x 10, 2.5# x 10 (no pain noted) (for pectoralis and serratus strengthening)  [place]  BW x 10, 1# x 10, 2# x 10 (no pain) (for lower trapezius strengthening)   Bent over horizontal abduction with BW, 1#, 2#, 3# with PT guided motion (no pain) for 10 reps each (for cuff and posterior deltoid strengthening)                           PT Education - 09/06/15 1045    Education provided Yes   Education Details Ashby Dawes and course of symptoms post surgery and treatment approach with progressive loading.    Person(s) Educated Patient   Methods Explanation;Demonstration;Handout;Tactile cues   Comprehension Verbalized understanding;Returned demonstration;Tactile cues required             PT Long Term Goals - 09/04/15 1808    PT LONG TERM GOAL #1   Title Patient will improve with functional independence with Quickdash score of <45%   Baseline 61% at baseline, on 09/04/2015 - 50%    Time 6   Period Weeks    Status Partially Met   PT LONG TERM GOAL #2   Title Patient will report decreased pain of no more than 3/10   Baseline 9/10, 5 of 7 days in past week was less than 3/10 2 of 7 > 5/10   Time 6   Period Weeks   Status Partially Met   PT LONG TERM GOAL #3   Title Patient will return to full range of motion and strength of 4/5 in right arm   Baseline 3/5 strength decreased range of motion at baseline. On 1/24  able to complete 160 degrees active flexion, unable to complete strength testing based on protocol.    Time 6   Status Partially Met               Plan - 09/06/15 1048    Clinical Impression Statement Patient continues to report pain intermittently with good days and bad days. She continues to demonstrate good tolerance for progressive loading of her tissues. Patient reports no pain while leaving clinic, though she appears to have some central  sensitzation, as she has pain in several areas that appears chronic.    Pt will benefit from skilled therapeutic intervention in order to improve on the following deficits Decreased activity tolerance;Decreased strength;Decreased range of motion;Impaired UE functional use;Pain   Rehab Potential Good   Clinical Impairments Affecting Rehab Potential 2nd operation on R shoulder.    PT Frequency 2x / week   PT Duration 6 weeks   PT Treatment/Interventions Therapeutic exercise;Therapeutic activities;Manual techniques;Passive range of motion;Moist Heat   PT Next Visit Plan Strengthening within protocol limitations.    Consulted and Agree with Plan of Care Patient        Problem List Patient Active Problem List   Diagnosis Date Noted  . Lumbosacral spondylosis 12/26/2013  . DDD (degenerative disc disease), lumbar 12/26/2013  . 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 Diabetes (HCC) 11/18/2013  . Dyslipidemia 11/18/2013  . Convulsions, epileptic (HCC) 11/18/2013  . BP (high blood pressure) 11/18/2013  . Obstructive apnea 11/18/2013   . Arthropathy of cervical facet joint (HCC) 04/27/2012  . Pain in shoulder 03/25/2012  . Cervical spondylosis without myelopathy 12/31/2011  . Cervical pain 12/31/2011    Kerin Ransom, PT, DPT    09/06/2015, 1:18 PM  [place] [facility] [address], [place], [address] Phone: [phone]   Fax:  [phone]  Name: [name] Hartgrove  [name] MRN: [id number] Date of Birth: 1941/01/31

## 2015-09-11 ENCOUNTER — Ambulatory Visit: Payer: Medicare Other | Admitting: Physical Therapy

## 2015-09-27 DIAGNOSIS — M19019 Primary osteoarthritis, unspecified shoulder: Secondary | ICD-10-CM | POA: Insufficient documentation

## 2015-12-04 ENCOUNTER — Other Ambulatory Visit: Payer: Self-pay | Admitting: Obstetrics and Gynecology

## 2015-12-04 DIAGNOSIS — N644 Mastodynia: Secondary | ICD-10-CM

## 2015-12-14 ENCOUNTER — Other Ambulatory Visit: Payer: Medicare Other

## 2015-12-14 ENCOUNTER — Ambulatory Visit: Payer: Medicare Other | Attending: Obstetrics and Gynecology

## 2015-12-28 ENCOUNTER — Ambulatory Visit: Payer: Medicare Other | Attending: Obstetrics and Gynecology

## 2015-12-28 ENCOUNTER — Other Ambulatory Visit: Payer: Medicare Other

## 2016-01-30 ENCOUNTER — Other Ambulatory Visit
Admission: RE | Admit: 2016-01-30 | Discharge: 2016-01-30 | Disposition: A | Discharge status: Home / Self Care | Payer: Medicare Other | Source: Ambulatory Visit | Attending: Nurse Practitioner | Admitting: Nurse Practitioner

## 2016-01-30 DIAGNOSIS — R197 Diarrhea, unspecified: Secondary | ICD-10-CM | POA: Diagnosis present

## 2016-01-30 LAB — GASTROINTESTINAL PANEL BY PCR, STOOL (REPLACES STOOL CULTURE)

## 2016-01-30 LAB — C DIFFICILE QUICK SCREEN W PCR REFLEX
C Diff antigen: NEGATIVE
C Diff interpretation: NEGATIVE
C Diff toxin: NEGATIVE

## 2016-02-05 LAB — PANCREATIC ELASTASE, FECAL: Pancreatic Elastase-1, Stool: 395 ug Elast./g (ref >200)

## 2016-03-17 ENCOUNTER — Ambulatory Visit
Admission: RE | Admit: 2016-03-17 | Discharge: 2016-03-17 | Disposition: A | Discharge status: Home / Self Care | Payer: Medicare Other | Source: Ambulatory Visit | Attending: Unknown Physician Specialty | Admitting: Unknown Physician Specialty

## 2016-03-17 ENCOUNTER — Encounter
Admission: RE | Disposition: A | Discharge status: Home / Self Care | Payer: Self-pay | Source: Ambulatory Visit | Attending: Unknown Physician Specialty

## 2016-03-17 ENCOUNTER — Encounter: Payer: Self-pay | Admitting: *Deleted

## 2016-03-17 ENCOUNTER — Ambulatory Visit: Payer: Medicare Other | Admitting: Certified Registered Nurse Anesthetist

## 2016-03-17 DIAGNOSIS — G4730 Sleep apnea, unspecified: Secondary | ICD-10-CM | POA: Insufficient documentation

## 2016-03-17 DIAGNOSIS — N183 Chronic kidney disease, stage 3 (moderate): Secondary | ICD-10-CM | POA: Insufficient documentation

## 2016-03-17 DIAGNOSIS — D123 Benign neoplasm of transverse colon: Secondary | ICD-10-CM | POA: Insufficient documentation

## 2016-03-17 DIAGNOSIS — Z91048 Other nonmedicinal substance allergy status: Secondary | ICD-10-CM | POA: Diagnosis not present

## 2016-03-17 DIAGNOSIS — E785 Hyperlipidemia, unspecified: Secondary | ICD-10-CM | POA: Diagnosis not present

## 2016-03-17 DIAGNOSIS — Z85828 Personal history of other malignant neoplasm of skin: Secondary | ICD-10-CM | POA: Insufficient documentation

## 2016-03-17 DIAGNOSIS — M5021 Other cervical disc displacement,  high cervical region: Secondary | ICD-10-CM | POA: Diagnosis not present

## 2016-03-17 DIAGNOSIS — Z79899 Other long term (current) drug therapy: Secondary | ICD-10-CM | POA: Diagnosis not present

## 2016-03-17 DIAGNOSIS — G40909 Epilepsy, unspecified, not intractable, without status epilepticus: Secondary | ICD-10-CM | POA: Diagnosis not present

## 2016-03-17 DIAGNOSIS — Z91041 Radiographic dye allergy status: Secondary | ICD-10-CM | POA: Insufficient documentation

## 2016-03-17 DIAGNOSIS — D122 Benign neoplasm of ascending colon: Secondary | ICD-10-CM | POA: Insufficient documentation

## 2016-03-17 DIAGNOSIS — Z886 Allergy status to analgesic agent status: Secondary | ICD-10-CM | POA: Insufficient documentation

## 2016-03-17 DIAGNOSIS — M1990 Unspecified osteoarthritis, unspecified site: Secondary | ICD-10-CM | POA: Diagnosis not present

## 2016-03-17 DIAGNOSIS — K219 Gastro-esophageal reflux disease without esophagitis: Secondary | ICD-10-CM | POA: Insufficient documentation

## 2016-03-17 DIAGNOSIS — K529 Noninfective gastroenteritis and colitis, unspecified: Secondary | ICD-10-CM | POA: Diagnosis present

## 2016-03-17 DIAGNOSIS — Z880 Allergy status to penicillin: Secondary | ICD-10-CM | POA: Insufficient documentation

## 2016-03-17 DIAGNOSIS — Z90710 Acquired absence of both cervix and uterus: Secondary | ICD-10-CM | POA: Insufficient documentation

## 2016-03-17 DIAGNOSIS — Z9049 Acquired absence of other specified parts of digestive tract: Secondary | ICD-10-CM | POA: Insufficient documentation

## 2016-03-17 DIAGNOSIS — E119 Type 2 diabetes mellitus without complications: Secondary | ICD-10-CM | POA: Insufficient documentation

## 2016-03-17 DIAGNOSIS — I129 Hypertensive chronic kidney disease with stage 1 through stage 4 chronic kidney disease, or unspecified chronic kidney disease: Secondary | ICD-10-CM | POA: Insufficient documentation

## 2016-03-17 HISTORY — PX: COLONOSCOPY WITH PROPOFOL: SHX5780

## 2016-03-17 LAB — GLUCOSE, CAPILLARY: Glucose-Capillary: 114 mg/dL — ABNORMAL HIGH (ref 65–99)

## 2016-03-17 SURGERY — COLONOSCOPY WITH PROPOFOL
Anesthesia: General

## 2016-03-17 MED ORDER — MIDAZOLAM HCL 2 MG/2ML IJ SOLN
INTRAMUSCULAR | Status: DC | PRN
  Administered 2016-03-17: 1 mg via INTRAVENOUS

## 2016-03-17 MED ORDER — SODIUM CHLORIDE 0.9 % IV SOLN: INTRAVENOUS | Status: DC

## 2016-03-17 MED ORDER — PROPOFOL 10 MG/ML IV BOLUS
INTRAVENOUS | Status: DC | PRN
  Administered 2016-03-17: 30 mg via INTRAVENOUS

## 2016-03-17 MED ORDER — SODIUM CHLORIDE 0.9 % IV SOLN
INTRAVENOUS | Status: DC
  Administered 2016-03-17: 1000 mL via INTRAVENOUS

## 2016-03-17 MED ORDER — PROPOFOL 500 MG/50ML IV EMUL
INTRAVENOUS | Status: DC | PRN
  Administered 2016-03-17: 130 ug/kg/min via INTRAVENOUS

## 2016-03-17 MED ORDER — LIDOCAINE HCL (CARDIAC) 10 MG/ML IV SOLN
INTRAVENOUS | Status: DC | PRN
  Administered 2016-03-17: 30 mg via INTRAVENOUS

## 2016-03-17 NOTE — Anesthesia Procedure Notes (Signed)
Date/Time: 03/17/2016 10:36 AM Performed by: Johnna Acosta Pre-anesthesia Checklist: Patient identified, Emergency Drugs available, Suction available, Patient being monitored and Timeout performed Patient Re-evaluated:Patient Re-evaluated prior to inductionOxygen Delivery Method: Nasal cannula

## 2016-03-17 NOTE — Anesthesia Postprocedure Evaluation (Signed)
Anesthesia Post Note  Patient: [name] [name]  Procedure(s) Performed: Procedure(s) (LRB): COLONOSCOPY WITH PROPOFOL (N/A)  Patient location during evaluation: Endoscopy Anesthesia Type: General Level of consciousness: awake and alert Pain management: pain level controlled Vital Signs Assessment: post-procedure vital signs reviewed and stable Respiratory status: spontaneous breathing, nonlabored ventilation, respiratory function stable and patient connected to nasal cannula oxygen Cardiovascular status: blood pressure returned to baseline and stable Postop Assessment: no signs of nausea or vomiting Anesthetic complications: no    Last Vitals:  Vitals:   03/17/16 1125 03/17/16 1135  BP: 130/65 (!) 145/65  Pulse:    Resp:    Temp:      Last Pain:  Vitals:   03/17/16 1107  TempSrc: Tympanic                 Martha Clan

## 2016-03-17 NOTE — Op Note (Signed)
[facility] Gastroenterology Patient Name: [name] [name] Procedure Date: 03/17/2016 10:32 AM MRN: [id number] Account #: [id number] Date of Birth: [birth year] Admit Type: Outpatient Age: 75 Room: [facility] ENDO ROOM 1 Gender: Female Note Status: Finalized Procedure:            Colonoscopy Indications:          Chronic diarrhea, Clinically significant diarrhea of                        unexplained origin Providers:            Manya Silvas, MD Medicines:            Propofol per Anesthesia Complications:        No immediate complications. Procedure:            Pre-Anesthesia Assessment:                       - After reviewing the risks and benefits, the patient                        was deemed in satisfactory condition to undergo the                        procedure.                       After obtaining informed consent, the colonoscope was                        passed under direct vision. Throughout the procedure,                        the patient's blood pressure, pulse, and oxygen                        saturations were monitored continuously. The                        Colonoscope was introduced through the anus and                        advanced to the the cecum, identified by appendiceal                        orifice and ileocecal valve. The colonoscopy was                        performed without difficulty. The patient tolerated the                        procedure well. The quality of the bowel preparation                        was good. Findings:      Three sessile polyps were found in the transverse colon, hepatic flexure       and ascending colon. The polyps were diminutive in size. These polyps       were removed with a jumbo cold forceps. Resection and retrieval were       complete.      The colon (entire examined portion) appeared normal. Biopsies for  histology were taken with a cold forceps from the ascending colon,       transverse  colon, descending colon and sigmoid colon for evaluation of       microscopic colitis. Biopsies for histology were taken with a cold       forceps for evaluation of microscopic colitis. Impression:           - Three diminutive polyps in the transverse colon, at                        the hepatic flexure and in the ascending colon, removed                        with a jumbo cold forceps. Resected and retrieved.                       - The entire examined colon is normal. Biopsied. Recommendation:       - Await pathology results.                       - Resume previous diet indefinitely. Take Imodium as                        needed, await biopsies. Manya Silvas, MD 03/17/2016 11:09:09 AM This report has been signed electronically. Number of Addenda: 0 Note Initiated On: 03/17/2016 10:32 AM Scope Withdrawal Time: 0 hours 20 minutes 1 second  Total Procedure Duration: 0 hours 25 minutes 3 seconds       [place]

## 2016-03-17 NOTE — Anesthesia Preprocedure Evaluation (Signed)
Anesthesia Evaluation  Patient identified by MRN, date of birth, ID band Patient awake    Reviewed: Allergy & Precautions, H&P , NPO status , Patient's Chart, lab work & pertinent test results, reviewed documented beta blocker date and time   History of Anesthesia Complications (+) PROLONGED EMERGENCE and history of anesthetic complications  Airway Mallampati: III  TM Distance: >3 FB Neck ROM: full    Dental no notable dental hx. (+) Teeth Intact   Pulmonary neg shortness of breath, sleep apnea and Continuous Positive Airway Pressure Ventilation , neg COPD, neg recent URI,    Pulmonary exam normal breath sounds clear to auscultation       Cardiovascular Exercise Tolerance: Good hypertension, On Medications (-) angina(-) CAD, (-) Past MI, (-) Cardiac Stents and (-) CABG Normal cardiovascular exam(-) dysrhythmias (-) Valvular Problems/Murmurs Rhythm:regular Rate:Normal     Neuro/Psych Seizures -, Well Controlled,  negative psych ROS   GI/Hepatic Neg liver ROS, GERD  ,  Endo/Other  diabetes, Type 2  Renal/GU CRFRenal disease  negative genitourinary   Musculoskeletal   Abdominal   Peds  Hematology negative hematology ROS (+) Blood dyscrasia, anemia ,   Anesthesia Other Findings Past Medical History: No date: Anemia No date: Arthritis No date: Cancer ([place])     Comment: melanoma No date: Chronic kidney disease     Comment: stage 3 No date: Complication of anesthesia     Comment: sometimes hard to wake up No date: Diabetes ([place])     Comment: diet controlled No date: 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,3 herniated disc No date: GERD (gastroesophageal reflux disease) No date: Headache No date: Hyperlipemia No date: Hypertension No date: Pain     Comment: chronic No date: Rosacea No date: Seizures ([facility])     Comment: partial epilepsy - none 2 yrs No date: Sleep apnea     Comment: cpap   Reproductive/Obstetrics negative OB ROS                             Anesthesia Physical Anesthesia Plan  ASA: II  Anesthesia Plan: General   Post-op Pain Management:    Induction:   Airway Management Planned:   Additional Equipment:   Intra-op Plan:   Post-operative Plan:   Informed Consent: I have reviewed the patients History and Physical, chart, labs and discussed the procedure including the risks, benefits and alternatives for the proposed anesthesia with the patient or authorized representative who has indicated his/her understanding and acceptance.   Dental Advisory Given  Plan Discussed with: Anesthesiologist, CRNA and Surgeon  Anesthesia Plan Comments:         Anesthesia Quick Evaluation

## 2016-03-17 NOTE — H&P (Signed)
Primary Care Physician:  [name] [name]., MD Primary Gastroenterologist:  Dr. Vira [name]  Pre-Procedure History & Physical: HPI:  [name] [name] is a 75 y.o. female is here for an colonoscopy.   Past Medical History:  Diagnosis Date  . Anemia   . Arthritis   . Cancer ([place])    melanoma  . Chronic kidney disease    stage 3  . Complication of anesthesia    sometimes hard to wake up  . Diabetes ([name] [name])    diet controlled  . 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 GERD (gastroesophageal reflux disease)   . Headache   . Hyperlipemia   . Hypertension   . Pain    chronic  . Rosacea   . Seizures ([name] [name])    partial epilepsy - none 2 yrs  . Sleep apnea    cpap    Past Surgical History:  Procedure Laterality Date  . ABDOMINAL HYSTERECTOMY    . BACK SURGERY     lumbar fusion, laminectomy  . BLEPHAROPLASTY Bilateral 10/24/14   Dr. Loni Muse. Vickki [name], MBSC  . BREAST SURGERY     reduction  . CHOLECYSTECTOMY    . rcr    . REDUCTION MAMMAPLASTY Bilateral 1985  . SHOULDER ARTHROSCOPY WITH OPEN ROTATOR CUFF REPAIR Right 06/22/2015   Procedure: SHOULDER ARTHROSCOPY WITH SUBACROMIAL DECOMPRESSION, RELEASE OF LONG HEAD OF BICEPS TENDON, RESECTION OF DISTAL CLAVICLE;  Surgeon: [name] Kail, MD;  Location: [name];  Service: Orthopedics;  Laterality: Right;  DIABETIC - diet controlled CPAP   . SKIN CANCER EXCISION     leg, back    Prior to Admission medications   Medication Sig Start Date End Date Taking? Authorizing Provider  atorvastatin (LIPITOR) 80 MG tablet Take 80 mg by mouth daily.   Yes Historical Provider, MD  carvedilol (COREG) 12.5 MG tablet Take 12.5 mg by mouth 2 (two) times daily with a meal.   Yes Historical Provider, MD  gabapentin (NEURONTIN) 600 MG tablet 600 mg 3 (three) times daily.  02/10/14  Yes Historical Provider, MD  levETIRAcetam (KEPPRA) 750 MG tablet Take 750 mg by mouth 2 (two) times daily.   Yes Historical Provider, MD   losartan-hydrochlorothiazide (HYZAAR) 50-12.5 MG per tablet Take by mouth daily.  03/28/09  Yes Historical Provider, MD  acetaminophen (TYLENOL) 500 MG tablet Take 500 mg by mouth every 8 (eight) hours as needed.    Historical Provider, MD  fenofibrate 160 MG tablet Take by mouth daily.  03/03/12   Historical Provider, MD  omeprazole (PRILOSEC) 20 MG capsule Take by mouth daily.  10/11/08   Historical Provider, MD  traZODone (DESYREL) 50 MG tablet Take 50 mg by mouth at bedtime.    Historical Provider, MD    Allergies as of 02/14/2016 - Review Complete 08/09/2015  Allergen Reaction Noted  . Contrast media [iodinated diagnostic agents] Other (See Comments) 06/20/2015  . Tramadol Other (See Comments) 03/31/2014  . Adhesive [tape] Rash 06/20/2015  . Penicillins Rash 03/31/2014    History reviewed. No pertinent family history.  Social History   Social History  . Marital status: Married    Spouse name: N/A  . Number of children: N/A  . Years of education: N/A   Occupational History  . Not on file.   Social History Main Topics  . Smoking status: Never Smoker  . Smokeless tobacco: Never Used  . Alcohol use No  . Drug use: No  . Sexual activity: Not  on file   Other Topics Concern  . Not on file   Social History Narrative  . No narrative on file    Review of Systems: See HPI, otherwise negative ROS  Physical Exam: BP (!) 156/56   Pulse 62   Temp 97.5 F (36.4 C) (Tympanic)   Resp 16   Ht 4\' 11"  (1.499 m)   Wt 68 kg (150 lb)   SpO2 100%   BMI 30.30 kg/m  General:   Alert,  pleasant and cooperative in NAD Head:  Normocephalic and atraumatic. Neck:  Supple; no masses or thyromegaly. Lungs:  Clear throughout to auscultation.    Heart:  Regular rate and rhythm. Abdomen:  Soft, nontender and nondistended. Normal bowel sounds, without guarding, and without rebound.   Neurologic:  Alert and  oriented x4;  grossly normal neurologically.  Impression/Plan: [name] [name]  is here for an colonoscopy to be performed for chronic diarrhea  Risks, benefits, limitations, and alternatives regarding  colonoscopy have been reviewed with the patient.  Questions have been answered.  All parties agreeable.   [name] Cheers, MD  03/17/2016, 10:31 AM

## 2016-03-17 NOTE — Transfer of Care (Signed)
Immediate Anesthesia Transfer of Care Note  Patient: [name] [name]  Procedure(s) Performed: Procedure(s): COLONOSCOPY WITH PROPOFOL (N/A)  Patient Location: PACU  Anesthesia Type:General  Level of Consciousness: sedated  Airway & Oxygen Therapy: Patient Spontanous Breathing and Patient connected to nasal cannula oxygen  Post-op Assessment: Report given to RN and Post -op Vital signs reviewed and stable  Post vital signs: Reviewed and stable  Last Vitals:  Vitals:   03/17/16 0912 03/17/16 1107  BP: (!) 156/56 (!) 108/53  Pulse: 62 71  Resp: 16 15  Temp: 36.4 C 36.3 C    Last Pain:  Vitals:   03/17/16 1107  TempSrc: Tympanic         Complications: No apparent anesthesia complications

## 2016-03-18 ENCOUNTER — Encounter: Payer: Self-pay | Admitting: Unknown Physician Specialty

## 2016-03-18 LAB — SURGICAL PATHOLOGY

## 2016-04-10 ENCOUNTER — Ambulatory Visit
Admission: RE | Admit: 2016-04-10 | Discharge: 2016-04-10 | Disposition: A | Discharge status: Home / Self Care | Payer: Medicare Other | Source: Ambulatory Visit | Attending: Obstetrics and Gynecology | Admitting: Obstetrics and Gynecology

## 2016-04-10 DIAGNOSIS — N644 Mastodynia: Secondary | ICD-10-CM | POA: Insufficient documentation

## 2016-04-10 DIAGNOSIS — N63 Unspecified lump in breast: Secondary | ICD-10-CM | POA: Insufficient documentation

## 2016-07-04 ENCOUNTER — Encounter: Payer: Self-pay | Admitting: Medical Oncology

## 2016-07-04 ENCOUNTER — Emergency Department
Admission: EM | Admit: 2016-07-04 | Discharge: 2016-07-04 | Disposition: A | Discharge status: Home / Self Care | Payer: Medicare Other | Attending: Emergency Medicine | Admitting: Emergency Medicine

## 2016-07-04 ENCOUNTER — Emergency Department: Payer: Medicare Other

## 2016-07-04 DIAGNOSIS — Z85828 Personal history of other malignant neoplasm of skin: Secondary | ICD-10-CM | POA: Insufficient documentation

## 2016-07-04 DIAGNOSIS — N183 Chronic kidney disease, stage 3 (moderate): Secondary | ICD-10-CM | POA: Diagnosis not present

## 2016-07-04 DIAGNOSIS — R0789 Other chest pain: Secondary | ICD-10-CM | POA: Insufficient documentation

## 2016-07-04 DIAGNOSIS — Z79899 Other long term (current) drug therapy: Secondary | ICD-10-CM | POA: Diagnosis not present

## 2016-07-04 DIAGNOSIS — E1122 Type 2 diabetes mellitus with diabetic chronic kidney disease: Secondary | ICD-10-CM | POA: Insufficient documentation

## 2016-07-04 DIAGNOSIS — R079 Chest pain, unspecified: Secondary | ICD-10-CM | POA: Diagnosis present

## 2016-07-04 DIAGNOSIS — I129 Hypertensive chronic kidney disease with stage 1 through stage 4 chronic kidney disease, or unspecified chronic kidney disease: Secondary | ICD-10-CM | POA: Insufficient documentation

## 2016-07-04 LAB — BASIC METABOLIC PANEL
ANION GAP: 7 (ref 5–15)
BUN: 17 mg/dL (ref 6–20)
CALCIUM: 8.9 mg/dL (ref 8.9–10.3)
CO2: 27 mmol/L (ref 22–32)
Chloride: 107 mmol/L (ref 101–111)
Creatinine, Ser: 0.72 mg/dL (ref 0.44–1.00)
Glucose, Bld: 124 mg/dL — ABNORMAL HIGH (ref 65–99)
Potassium: 3.5 mmol/L (ref 3.5–5.1)
SODIUM: 141 mmol/L (ref 135–145)

## 2016-07-04 LAB — CBC
HCT: 37.7 % (ref 35.0–47.0)
HEMOGLOBIN: 13 g/dL (ref 12.0–16.0)
MCH: 31.3 pg (ref 26.0–34.0)
MCHC: 34.3 g/dL (ref 32.0–36.0)
MCV: 91 fL (ref 80.0–100.0)
Platelets: 194 10*3/uL (ref 150–440)
RBC: 4.15 MIL/uL (ref 3.80–5.20)
RDW: 14.1 % (ref 11.5–14.5)
WBC: 5.5 10*3/uL (ref 3.6–11.0)

## 2016-07-04 LAB — TROPONIN I

## 2016-07-04 MED ORDER — OXYCODONE-ACETAMINOPHEN 5-325 MG PO TABS
1.0000 | ORAL_TABLET | Freq: Once | ORAL | Status: AC
  Administered 2016-07-04: 1 via ORAL
  Filled 2016-07-04: qty 1

## 2016-07-04 MED ORDER — VALACYCLOVIR HCL 500 MG PO TABS
1000.0000 mg | ORAL_TABLET | Freq: Once | ORAL | Status: AC
  Administered 2016-07-04: 1000 mg via ORAL

## 2016-07-04 MED ORDER — VALACYCLOVIR HCL 500 MG PO TABS
ORAL_TABLET | ORAL | Status: AC
  Filled 2016-07-04: qty 2

## 2016-07-04 MED ORDER — VALACYCLOVIR HCL 1 G PO TABS: 1000.0000 mg | ORAL_TABLET | Freq: Three times a day (TID) | ORAL | 0 refills | Status: AC

## 2016-07-04 MED ORDER — OXYCODONE-ACETAMINOPHEN 5-325 MG PO TABS: 1.0000 | ORAL_TABLET | Freq: Four times a day (QID) | ORAL | 0 refills | Status: AC | PRN

## 2016-07-04 NOTE — ED Triage Notes (Signed)
Pt reports pain to left side of chest under breast area. Pt tender to touch on area but no rash noted. Pt denies sob.

## 2016-07-04 NOTE — ED Provider Notes (Signed)
[facility] Emergency Department Provider Note   ____________________________________________   First MD Initiated Contact with Patient 07/04/16 1309     (approximate)  I have reviewed the triage vital signs and the nursing notes.   HISTORY  Chief Complaint Chest Pain    HPI [name] [name] is a 75 y.o. female who comes in today for chest pain. Patient reports pain started last evening. It is on the left side under her breast radiating around to the back. She has no history of any injury and no bruising and no rash. The pain came on spontaneously. Even the lightest touch to the skin in the area of the pain is tender and hurts and reproduces the pain exactly. There is about a 3-4 inch wide band of skin around from under the breast around to the back which is tender like this. Patient has no fever no cough is not particularly short of breath except for when she breathes the clock touches her skin and moves and makes it hurt. Again there is no history of any injury.   Past Medical History:  Diagnosis Date  . Anemia   . Arthritis   . Cancer ([place])    melanoma  . Chronic kidney disease    stage 3  . Complication of anesthesia    sometimes hard to wake up  . Diabetes ([place])    diet controlled  . 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 GERD (gastroesophageal reflux disease)   . Headache   . Hyperlipemia   . Hypertension   . Pain    chronic  . Rosacea   . Seizures ([place])    partial epilepsy - none 2 yrs  . Sleep apnea    cpap    Patient Active Problem List   Diagnosis Date Noted  . Lumbosacral spondylosis 12/26/2013  . DDD (degenerative disc disease), lumbar 12/26/2013  . 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 Diabetes ([place]) 11/18/2013  . Dyslipidemia 11/18/2013  . Convulsions, epileptic ([place]) 11/18/2013  . BP (high blood pressure) 11/18/2013  . Obstructive apnea 11/18/2013  . Arthropathy of cervical facet joint  04/27/2012  . Pain in shoulder 03/25/2012  . Cervical spondylosis without myelopathy 12/31/2011  . Cervical pain 12/31/2011    Past Surgical History:  Procedure Laterality Date  . ABDOMINAL HYSTERECTOMY    . BACK SURGERY     lumbar fusion, laminectomy  . BLEPHAROPLASTY Bilateral 10/24/14   Dr. Loni Muse. Vickki Muff, MBSC  . BREAST SURGERY     reduction  . CHOLECYSTECTOMY    . COLONOSCOPY WITH PROPOFOL N/A 03/17/2016   Procedure: COLONOSCOPY WITH PROPOFOL;  Surgeon: Manya Silvas, MD;  Location: [facility] ENDOSCOPY;  Service: Endoscopy;  Laterality: N/A;  . rcr    . REDUCTION MAMMAPLASTY Bilateral 1985  . SHOULDER ARTHROSCOPY WITH OPEN ROTATOR CUFF REPAIR Right 06/22/2015   Procedure: SHOULDER ARTHROSCOPY WITH SUBACROMIAL DECOMPRESSION, RELEASE OF LONG HEAD OF BICEPS TENDON, RESECTION OF DISTAL CLAVICLE;  Surgeon: Leanor Kail, MD;  Location: [place];  Service: Orthopedics;  Laterality: Right;  DIABETIC - diet controlled CPAP   . SKIN CANCER EXCISION     leg, back    Prior to Admission medications   Medication Sig Start Date End Date Taking? Authorizing Provider  acetaminophen (TYLENOL) 500 MG tablet Take 500 mg by mouth every 8 (eight) hours as needed.    Historical Provider, MD  atorvastatin (LIPITOR) 80 MG tablet Take 80 mg by  mouth daily.    Historical Provider, MD  carvedilol (COREG) 12.5 MG tablet Take 12.5 mg by mouth 2 (two) times daily with a meal.    Historical Provider, MD  fenofibrate 160 MG tablet Take by mouth daily.  03/03/12   Historical Provider, MD  gabapentin (NEURONTIN) 600 MG tablet 600 mg 3 (three) times daily.  02/10/14   Historical Provider, MD  levETIRAcetam (KEPPRA) 750 MG tablet Take 750 mg by mouth 2 (two) times daily.    Historical Provider, MD  losartan-hydrochlorothiazide (HYZAAR) 50-12.5 MG per tablet Take by mouth daily.  03/28/09   Historical Provider, MD  omeprazole (PRILOSEC) 20 MG capsule Take by mouth daily.  10/11/08   Historical Provider, MD    traZODone (DESYREL) 50 MG tablet Take 50 mg by mouth at bedtime.    Historical Provider, MD    Allergies Contrast media [iodinated diagnostic agents]; Tramadol; Adhesive [tape]; and Penicillins  Family History  Problem Relation Age of Onset  . Breast cancer Neg Hx     Social History Social History  Substance Use Topics  . Smoking status: Never Smoker  . Smokeless tobacco: Never Used  . Alcohol use No    Review of Systems Constitutional: No fever/chills Eyes: No visual changes. ENT: No sore throat. Cardiovascular:See history of present illness Respiratory: The history of present illness Gastrointestinal: No abdominal pain.  No nausea, no vomiting.  No diarrhea.  No constipation. Genitourinary: Negative for dysuria. Musculoskeletal: Negative for back pain. Skin: Negative for rash. Neurological: Negative for headaches, focal weakness or numbness.  10-point ROS otherwise negative.  ____________________________________________   PHYSICAL EXAM:  VITAL SIGNS: ED Triage Vitals  Enc Vitals Group     BP 07/04/16 1147 (!) 148/60     Pulse Rate 07/04/16 1147 60     Resp 07/04/16 1147 18     Temp 07/04/16 1147 97.6 F (36.4 C)     Temp Source 07/04/16 1147 Oral     SpO2 07/04/16 1147 100 %     Weight 07/04/16 1148 150 lb (68 kg)     Height 07/04/16 1148 4\' 10"  (1.473 m)     Head Circumference --      Peak Flow --      Pain Score 07/04/16 1148 10     Pain Loc --      Pain Edu? --      Excl. in [place]? --     Constitutional: Alert and oriented. Well appearing and in no acute distress. Eyes: Conjunctivae are normal. PERRL. EOMI. Head: Atraumatic. Nose: No congestion/rhinnorhea. Mouth/Throat: Mucous membranes are moist.  Oropharynx non-erythematous. Neck: No stridor. Cardiovascular: Normal rate, regular rhythm. Grossly normal heart sounds.  Good peripheral circulation. Respiratory: Normal respiratory effort.  No retractions. Lungs CTAB. Gastrointestinal: Soft and  nontender. No distention. No abdominal bruits. No CVA tenderness. Musculoskeletal: No lower extremity tenderness nor edema.  No joint effusions. Neurologic:  Normal speech and language. No gross focal neurologic deficits are appreciated. No gait instability. Skin:  Skin is warm, dry and intact. No rash noted.Tenderness as described in history of present illness Psychiatric: Mood and affect are normal. Speech and behavior are normal.  ____________________________________________   LABS (all labs ordered are listed, but only abnormal results are displayed)  Labs Reviewed  BASIC METABOLIC PANEL - Abnormal; Notable for the following:       Result Value   Glucose, Bld 124 (*)    All other components within normal limits  CBC  TROPONIN I  ____________________________________________  EKG  EKG read and interpreted by me shows sinus bradycardia rate of 53 normal axis diffuse ST-T wave flattening but otherwise no acute changes. ____________________________________________  RADIOLOGY Study Result   CLINICAL DATA:  Left side chest pain since yesterday  EXAM: CHEST  2 VIEW  COMPARISON:  None.  FINDINGS: Heart is borderline in size. No confluent airspace opacities or effusions. No acute bony abnormality.  IMPRESSION: No active cardiopulmonary disease.   Electronically Signed   By: Rolm Baptise M.D.   On: 07/04/2016 12:20      ____________________________________________   PROCEDURES  Procedure(s) performed:  Procedures  Critical Care performed:   ____________________________________________   INITIAL IMPRESSION / ASSESSMENT AND PLAN / ED COURSE  Pertinent labs & imaging results that were available during my care of the patient were reviewed by me and considered in my medical decision making (see chart for details).    Clinical Course      ____________________________________________   FINAL CLINICAL IMPRESSION(S) / ED DIAGNOSES  Final  diagnoses:  Chest wall pain      NEW MEDICATIONS STARTED DURING THIS VISIT:  New Prescriptions   No medications on file     Note:  This document was prepared using Dragon voice recognition software and may include unintentional dictation errors.    Nena Polio, MD 07/04/16 [id number]

## 2016-07-04 NOTE — Discharge Instructions (Signed)
I think he might be having early shingles. Please take the valacyclovir 1 pill 3 times a day for a week 7 days. Please take the Percocet 1 pill 4 times a day as needed for pain. If that is not enough she can take 2 pills 4 times a day for pain. Do not take any extra Tylenol or more Percocet than that. I can result in a Tylenol overdose which can be fatal. He careful the Percocet can make you sleepy woozy and constipated. Do not drive on it. He developed a rash as we talked about call your doctor and let him know. If the pain worsens or to get a fever shortness of breath or any other complaints please return or see her doctor immediately. Please also follow-up with your doctor if you're not any better in 2 or 3 days.

## 2016-08-22 ENCOUNTER — Encounter: Payer: Self-pay | Admitting: Emergency Medicine

## 2016-08-22 ENCOUNTER — Emergency Department: Payer: Medicare HMO

## 2016-08-22 ENCOUNTER — Observation Stay
Admission: EM | Admit: 2016-08-22 | Discharge: 2016-08-23 | Disposition: A | Discharge status: Home / Self Care | Payer: Medicare HMO | Attending: Internal Medicine | Admitting: Internal Medicine

## 2016-08-22 DIAGNOSIS — G8929 Other chronic pain: Secondary | ICD-10-CM | POA: Diagnosis not present

## 2016-08-22 DIAGNOSIS — Z880 Allergy status to penicillin: Secondary | ICD-10-CM | POA: Diagnosis not present

## 2016-08-22 DIAGNOSIS — M1990 Unspecified osteoarthritis, unspecified site: Secondary | ICD-10-CM | POA: Insufficient documentation

## 2016-08-22 DIAGNOSIS — Z23 Encounter for immunization: Secondary | ICD-10-CM | POA: Diagnosis not present

## 2016-08-22 DIAGNOSIS — M5136 Other intervertebral disc degeneration, lumbar region: Secondary | ICD-10-CM | POA: Insufficient documentation

## 2016-08-22 DIAGNOSIS — I129 Hypertensive chronic kidney disease with stage 1 through stage 4 chronic kidney disease, or unspecified chronic kidney disease: Secondary | ICD-10-CM | POA: Insufficient documentation

## 2016-08-22 DIAGNOSIS — R200 Anesthesia of skin: Secondary | ICD-10-CM | POA: Diagnosis not present

## 2016-08-22 DIAGNOSIS — K5730 Diverticulosis of large intestine without perforation or abscess without bleeding: Secondary | ICD-10-CM | POA: Diagnosis not present

## 2016-08-22 DIAGNOSIS — Z888 Allergy status to other drugs, medicaments and biological substances status: Secondary | ICD-10-CM | POA: Insufficient documentation

## 2016-08-22 DIAGNOSIS — G40909 Epilepsy, unspecified, not intractable, without status epilepticus: Secondary | ICD-10-CM | POA: Insufficient documentation

## 2016-08-22 DIAGNOSIS — J320 Chronic maxillary sinusitis: Secondary | ICD-10-CM | POA: Insufficient documentation

## 2016-08-22 DIAGNOSIS — N183 Chronic kidney disease, stage 3 (moderate): Secondary | ICD-10-CM | POA: Diagnosis not present

## 2016-08-22 DIAGNOSIS — I10 Essential (primary) hypertension: Secondary | ICD-10-CM | POA: Diagnosis present

## 2016-08-22 DIAGNOSIS — R0789 Other chest pain: Secondary | ICD-10-CM | POA: Diagnosis not present

## 2016-08-22 DIAGNOSIS — I700 Atherosclerosis of aorta: Secondary | ICD-10-CM | POA: Diagnosis not present

## 2016-08-22 DIAGNOSIS — M47817 Spondylosis without myelopathy or radiculopathy, lumbosacral region: Secondary | ICD-10-CM | POA: Insufficient documentation

## 2016-08-22 DIAGNOSIS — Z9049 Acquired absence of other specified parts of digestive tract: Secondary | ICD-10-CM | POA: Insufficient documentation

## 2016-08-22 DIAGNOSIS — Z7982 Long term (current) use of aspirin: Secondary | ICD-10-CM | POA: Insufficient documentation

## 2016-08-22 DIAGNOSIS — M47812 Spondylosis without myelopathy or radiculopathy, cervical region: Secondary | ICD-10-CM | POA: Insufficient documentation

## 2016-08-22 DIAGNOSIS — L719 Rosacea, unspecified: Secondary | ICD-10-CM | POA: Diagnosis not present

## 2016-08-22 DIAGNOSIS — E785 Hyperlipidemia, unspecified: Secondary | ICD-10-CM | POA: Diagnosis not present

## 2016-08-22 DIAGNOSIS — Z85820 Personal history of malignant melanoma of skin: Secondary | ICD-10-CM | POA: Insufficient documentation

## 2016-08-22 DIAGNOSIS — Z91041 Radiographic dye allergy status: Secondary | ICD-10-CM | POA: Insufficient documentation

## 2016-08-22 DIAGNOSIS — R531 Weakness: Secondary | ICD-10-CM | POA: Diagnosis not present

## 2016-08-22 DIAGNOSIS — Z885 Allergy status to narcotic agent status: Secondary | ICD-10-CM | POA: Diagnosis not present

## 2016-08-22 DIAGNOSIS — Z90710 Acquired absence of both cervix and uterus: Secondary | ICD-10-CM | POA: Insufficient documentation

## 2016-08-22 DIAGNOSIS — E1122 Type 2 diabetes mellitus with diabetic chronic kidney disease: Secondary | ICD-10-CM | POA: Insufficient documentation

## 2016-08-22 DIAGNOSIS — K219 Gastro-esophageal reflux disease without esophagitis: Secondary | ICD-10-CM | POA: Diagnosis not present

## 2016-08-22 DIAGNOSIS — G4730 Sleep apnea, unspecified: Secondary | ICD-10-CM | POA: Diagnosis not present

## 2016-08-22 DIAGNOSIS — R55 Syncope and collapse: Principal | ICD-10-CM | POA: Insufficient documentation

## 2016-08-22 DIAGNOSIS — R29898 Other symptoms and signs involving the musculoskeletal system: Secondary | ICD-10-CM | POA: Diagnosis present

## 2016-08-22 DIAGNOSIS — E119 Type 2 diabetes mellitus without complications: Secondary | ICD-10-CM

## 2016-08-22 DIAGNOSIS — G4733 Obstructive sleep apnea (adult) (pediatric): Secondary | ICD-10-CM | POA: Insufficient documentation

## 2016-08-22 DIAGNOSIS — R079 Chest pain, unspecified: Secondary | ICD-10-CM | POA: Diagnosis present

## 2016-08-22 LAB — DIFFERENTIAL
Basophils Absolute: 0.1 10*3/uL (ref 0–0.1)
Basophils Relative: 1 %
Eosinophils Absolute: 0.1 10*3/uL (ref 0–0.7)
Eosinophils Relative: 1 %
LYMPHS ABS: 1.7 10*3/uL (ref 1.0–3.6)
LYMPHS PCT: 21 %
MONO ABS: 0.7 10*3/uL (ref 0.2–0.9)
MONOS PCT: 9 %
NEUTROS ABS: 5.4 10*3/uL (ref 1.4–6.5)
Neutrophils Relative %: 68 %

## 2016-08-22 LAB — PROTIME-INR
INR: 0.86
Prothrombin Time: 11.7 seconds (ref 11.4–15.2)

## 2016-08-22 LAB — COMPREHENSIVE METABOLIC PANEL
ALK PHOS: 97 U/L (ref 38–126)
ALT: 27 U/L (ref 14–54)
AST: 31 U/L (ref 15–41)
Albumin: 3.8 g/dL (ref 3.5–5.0)
Anion gap: 8 (ref 5–15)
BILIRUBIN TOTAL: 0.6 mg/dL (ref 0.3–1.2)
BUN: 13 mg/dL (ref 6–20)
CALCIUM: 8.7 mg/dL — AB (ref 8.9–10.3)
CO2: 27 mmol/L (ref 22–32)
CREATININE: 0.67 mg/dL (ref 0.44–1.00)
Chloride: 104 mmol/L (ref 101–111)
Glucose, Bld: 103 mg/dL — ABNORMAL HIGH (ref 65–99)
Potassium: 4 mmol/L (ref 3.5–5.1)
Sodium: 139 mmol/L (ref 135–145)
Total Protein: 6.6 g/dL (ref 6.5–8.1)

## 2016-08-22 LAB — CBC
HEMATOCRIT: 37.5 % (ref 35.0–47.0)
HEMOGLOBIN: 12.8 g/dL (ref 12.0–16.0)
MCH: 30.7 pg (ref 26.0–34.0)
MCHC: 34.1 g/dL (ref 32.0–36.0)
MCV: 89.9 fL (ref 80.0–100.0)
Platelets: 254 10*3/uL (ref 150–440)
RBC: 4.17 MIL/uL (ref 3.80–5.20)
RDW: 14.5 % (ref 11.5–14.5)
WBC: 8 10*3/uL (ref 3.6–11.0)

## 2016-08-22 LAB — URINALYSIS, ROUTINE W REFLEX MICROSCOPIC
Bacteria, UA: NONE SEEN
Bilirubin Urine: NEGATIVE
GLUCOSE, UA: NEGATIVE mg/dL
Ketones, ur: NEGATIVE mg/dL
Leukocytes, UA: NEGATIVE
NITRITE: NEGATIVE
Protein, ur: NEGATIVE mg/dL
SPECIFIC GRAVITY, URINE: 1.004 — AB (ref 1.005–1.030)
pH: 6 (ref 5.0–8.0)

## 2016-08-22 LAB — APTT: aPTT: 27 seconds (ref 24–36)

## 2016-08-22 LAB — GLUCOSE, CAPILLARY: Glucose-Capillary: 103 mg/dL — ABNORMAL HIGH (ref 65–99)

## 2016-08-22 LAB — TROPONIN I

## 2016-08-22 MED ORDER — DIPHENHYDRAMINE HCL 50 MG/ML IJ SOLN
50.0000 mg | Freq: Once | INTRAMUSCULAR | Status: AC
  Administered 2016-08-22: 50 mg via INTRAVENOUS
  Filled 2016-08-22: qty 1

## 2016-08-22 MED ORDER — IOPAMIDOL (ISOVUE-370) INJECTION 76%
100.0000 mL | Freq: Once | INTRAVENOUS | Status: AC | PRN
  Administered 2016-08-22: 100 mL via INTRAVENOUS

## 2016-08-22 MED ORDER — HYDROCORTISONE NA SUCCINATE PF 100 MG IJ SOLR
200.0000 mg | Freq: Once | INTRAMUSCULAR | Status: AC
  Administered 2016-08-22: 200 mg via INTRAVENOUS
  Filled 2016-08-22: qty 4

## 2016-08-22 MED ORDER — ASPIRIN 81 MG PO CHEW
324.0000 mg | CHEWABLE_TABLET | Freq: Once | ORAL | Status: AC
  Administered 2016-08-22: 324 mg via ORAL
  Filled 2016-08-22: qty 4

## 2016-08-22 NOTE — Progress Notes (Signed)
   08/22/16 2200  Clinical Encounter Type  Visited With Family;Patient not available  Visit Type ED;Code  Referral From Nurse  Spiritual Encounters  Spiritual Needs Emotional  CH responded to code stroke page and met with family outside of pt room; Family made aware that [facility] available as needed. Gwynn Burly 10:13 PM

## 2016-08-22 NOTE — ED Provider Notes (Signed)
[facility] Emergency Department Provider Note  ____________________________________________   First MD Initiated Contact with Patient 08/22/16 2016     (approximate)  I have reviewed the triage vital signs and the nursing notes.   HISTORY  Chief Complaint No chief complaint on file.   HPI [name] [name] is a 76 y.o. female with a history of hyperlipidemia as well as hypertension is present to the emergency department today after an episode of chest pain, syncope and now with left-sided facial as well as left hand numbness. She says the symptoms started at about 6:50 PM. Says that the chest pain was to the center of her chest and severe. She denies any radiation of the pain and says that she is pain-free at this time. However, she continues to have left-sided facial as well as left-sided hand numbness. She has no history of stroke or heart problems.   Past Medical History:  Diagnosis Date  . Anemia   . Arthritis   . Cancer ([place])    melanoma  . Chronic kidney disease    stage 3  . Complication of anesthesia    sometimes hard to wake up  . Diabetes ([place])    diet controlled  . 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 GERD (gastroesophageal reflux disease)   . Headache   . Hyperlipemia   . Hypertension   . Pain    chronic  . Rosacea   . 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 Sleep apnea    cpap    Patient Active Problem List   Diagnosis Date Noted  . Lumbosacral spondylosis 12/26/2013  . DDD (degenerative disc disease), lumbar 12/26/2013  . 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 Diabetes ([place]) 11/18/2013  . Dyslipidemia 11/18/2013  . Convulsions, epileptic ([place]) 11/18/2013  . BP (high blood pressure) 11/18/2013  . Obstructive apnea 11/18/2013  . Arthropathy of cervical facet joint 04/27/2012  . Pain in shoulder 03/25/2012  . Cervical spondylosis without myelopathy 12/31/2011  . Cervical pain  12/31/2011    Past Surgical History:  Procedure Laterality Date  . ABDOMINAL HYSTERECTOMY    . BACK SURGERY     lumbar fusion, laminectomy  . BLEPHAROPLASTY Bilateral 10/24/14   Dr. Loni Muse. Vickki Muff, MBSC  . BREAST SURGERY     reduction  . CHOLECYSTECTOMY    . COLONOSCOPY WITH PROPOFOL N/A 03/17/2016   Procedure: COLONOSCOPY WITH PROPOFOL;  Surgeon: Manya Silvas, MD;  Location: [facility] ENDOSCOPY;  Service: Endoscopy;  Laterality: N/A;  . rcr    . REDUCTION MAMMAPLASTY Bilateral 1985  . SHOULDER ARTHROSCOPY WITH OPEN ROTATOR CUFF REPAIR Right 06/22/2015   Procedure: SHOULDER ARTHROSCOPY WITH SUBACROMIAL DECOMPRESSION, RELEASE OF LONG HEAD OF BICEPS TENDON, RESECTION OF DISTAL CLAVICLE;  Surgeon: Leanor Kail, MD;  Location: [place];  Service: Orthopedics;  Laterality: Right;  DIABETIC - diet controlled CPAP   . SKIN CANCER EXCISION     leg, back    Prior to Admission medications   Medication Sig Start Date End Date Taking? Authorizing Provider  acetaminophen (TYLENOL) 500 MG tablet Take 500 mg by mouth every 8 (eight) hours as needed.    Historical Provider, MD  atorvastatin (LIPITOR) 80 MG tablet Take 80 mg by mouth daily.    Historical Provider, MD  carvedilol (COREG) 12.5 MG tablet Take 12.5 mg by mouth 2 (two) times daily with a meal.    Historical Provider,  MD  fenofibrate 160 MG tablet Take by mouth daily.  03/03/12   Historical Provider, MD  gabapentin (NEURONTIN) 600 MG tablet 600 mg 3 (three) times daily.  02/10/14   Historical Provider, MD  levETIRAcetam (KEPPRA) 750 MG tablet Take 750 mg by mouth 2 (two) times daily.    Historical Provider, MD  losartan-hydrochlorothiazide (HYZAAR) 50-12.5 MG per tablet Take by mouth daily.  03/28/09   Historical Provider, MD  omeprazole (PRILOSEC) 20 MG capsule Take by mouth daily.  10/11/08   Historical Provider, MD  oxyCODONE-acetaminophen (ROXICET) 5-325 MG tablet Take 1 tablet by mouth every 6 (six) hours as needed. 07/04/16 07/04/17   Nena Polio, MD  traZODone (DESYREL) 50 MG tablet Take 50 mg by mouth at bedtime.    Historical Provider, MD    Allergies Contrast media [iodinated diagnostic agents]; Tramadol; Adhesive [tape]; and Penicillins  Family History  Problem Relation Age of Onset  . Breast cancer Neg Hx     Social History Social History  Substance Use Topics  . Smoking status: Never Smoker  . Smokeless tobacco: Never Used  . Alcohol use No    Review of Systems Constitutional: No fever/chills Eyes: No visual changes. ENT: No sore throat. Cardiovascular: As above Respiratory: Denies shortness of breath. Gastrointestinal: No abdominal pain.  No nausea, no vomiting.  No diarrhea.  No constipation. Genitourinary: Negative for dysuria. Musculoskeletal: Negative for back pain. Skin: Negative for rash. Neurological: Negative for headaches,   10-point ROS otherwise negative.  ____________________________________________   PHYSICAL EXAM:  VITAL SIGNS: ED Triage Vitals  Enc Vitals Group     BP 08/22/16 2100 (!) 169/68     Pulse Rate 08/22/16 2100 (!) 53     Resp 08/22/16 2100 11     Temp --      Temp src --      SpO2 08/22/16 2100 99 %     Weight 08/22/16 2033 144 lb (65.3 kg)     Height 08/22/16 2033 4\' 11"  (1.499 m)     Head Circumference --      Peak Flow --      Pain Score --      Pain Loc --      Pain Edu? --      Excl. in [place]? --     Constitutional: Alert and oriented. Well appearing and in no acute distress. Eyes: Conjunctivae are normal. PERRL. EOMI. Head: Atraumatic. Nose: No congestion/rhinnorhea. Mouth/Throat: Mucous membranes are moist.  Neck: No stridor.   Cardiovascular: Normal rate, regular rhythm. Grossly normal heart sounds.  Good peripheral circulation With equal, intact in bilateral radial as well as dorsalis pedis pulses. Respiratory: Normal respiratory effort.  No retractions. Lungs CTAB. Gastrointestinal: Soft and nontender. No distention.  Musculoskeletal:  No lower extremity tenderness nor edema.  No joint effusions. Neurologic:  Normal speech and language. 4-5 strength the left upper extremity. Sensation deficits to the left anterior cheek as well as the left hand. Skin:  Skin is warm, dry and intact. No rash noted. Psychiatric: Mood and affect are normal. Speech and behavior are normal.  NIH Stroke Scale  Person Administering Scale: Doran Stabler  Administer stroke scale items in the order listed. Record performance in each category after each subscale exam. Do not go back and change scores. Follow directions provided for each exam technique. Scores should reflect what the patient does, not what the clinician thinks the patient can do. The clinician should record answers while administering the exam  and work quickly. Except where indicated, the patient should not be coached (i.e., repeated requests to patient to make a special effort).   1a  Level of consciousness: 0=alert; keenly responsive  1b. LOC questions:  0=Performs both tasks correctly  1c. LOC commands: 0=Performs both tasks correctly  2.  Best Gaze: 0=normal  3.  Visual: 0=No visual loss  4. Facial Palsy: 0=Normal symmetric movement  5a.  Motor left arm: 1  5b.  Motor right arm: 0=No drift, limb holds 90 (or 45) degrees for full 10 seconds  6a. motor left leg: 0=No drift, limb holds 90 (or 45) degrees for full 10 seconds  6b  Motor right leg:  0=No drift, limb holds 90 (or 45) degrees for full 10 seconds  7. Limb Ataxia: 0=Absent  8.  Sensory: 1=Mild to moderate sensory loss; patient feels pinprick is less sharp or is dull on the affected side; there is a loss of superficial pain with pinprick but patient is aware She is being touched  9. Best Language:  0=No aphasia, normal  10. Dysarthria: 0=Normal  11. Extinction and Inattention: 0=No abnormality  12. Distal motor function: 0=Normal   Total:   2   ____________________________________________   LABS (all labs  ordered are listed, but only abnormal results are displayed)  Labs Reviewed  COMPREHENSIVE METABOLIC PANEL - Abnormal; Notable for the following:       Result Value   Glucose, Bld 103 (*)    Calcium 8.7 (*)    All other components within normal limits  GLUCOSE, CAPILLARY - Abnormal; Notable for the following:    Glucose-Capillary 103 (*)    All other components within normal limits  PROTIME-INR  APTT  CBC  DIFFERENTIAL  TROPONIN I  URINALYSIS, ROUTINE W REFLEX MICROSCOPIC   ____________________________________________  EKG  ED ECG REPORT I, Doran Stabler, the attending physician, personally viewed and interpreted this ECG.   Date: 08/22/2016  EKG Time: 2006  Rate: 56  Rhythm: normal sinus rhythm. EKG machine read as junctional rhythm but P waves visualized in V3. As well as 2.  Axis: Normal  Intervals:none  ST&T Change: No ST segment elevation or depression. No abnormal T-wave inversion.  ____________________________________________  [id number]  DG Chest 1 View (Final result)  Result time 08/22/16 21:00:45  Final result by Madie Reno, MD (08/22/16 21:00:45)           Narrative:   CLINICAL DATA: Retrosternal chest pain  EXAM: CHEST 1 VIEW  COMPARISON: 07/04/2016  FINDINGS: Stable mild cardiomegaly without overt failure. No acute infiltrate or effusion. Atherosclerosis. No pneumothorax.  IMPRESSION: Stable cardiomegaly without overt failure.   Electronically Signed By: Donavan Foil M.D. On: 08/22/2016 21:00            CT Head Code Stroke W/O CM (Final result)  Result time 08/22/16 20:17:50  Final result by Kristine Garbe, MD (08/22/16 20:17:50)           Narrative:   CLINICAL DATA: Code stroke. Right upper extremity numbness, chest pain, and syncope.  EXAM: CT HEAD WITHOUT CONTRAST  TECHNIQUE: Contiguous axial images were obtained from the base of the skull through the vertex without intravenous  contrast.  COMPARISON: 02/02/2006 CT head.  FINDINGS: Brain: No evidence of acute infarction, hemorrhage, hydrocephalus, extra-axial collection or mass lesion/mass effect. Few foci of hypoattenuation in subcortical white matter compatible with mild chronic microvascular ischemic changes.  Vascular: No hyperdense vessel. Mild calcific atherosclerosis of cavernous internal carotid arteries.  Skull:  Chronic left lower orbit fracture with mesh repair. Calvarium is unremarkable.  Sinuses/Orbits: No acute finding.  Other: Choose  ASPECTS ([place] Stroke Program Early CT Score)  - Ganglionic level infarction (caudate, lentiform nuclei, internal capsule, insula, M1-M3 cortex): 7  - Supraganglionic infarction (M4-M6 cortex): 3  Total score (0-10 with 10 being normal): 10  IMPRESSION: 1. No acute intracranial abnormality identified. If symptoms persist or if clinically indicated MRI is more sensitive for acute stroke. 2. ASPECTS is 10 These results were called by telephone at the time of interpretation on 08/22/2016 at 8:17 pm to Dr. Conni Slipper , who verbally acknowledged these results.   Electronically Signed By: Kristine Garbe M.D. On: 08/22/2016 20:17           CT Angio Chest Aorta W and/or Wo Contrast (Final result)  Result time 08/22/16 22:33:33  Final result by Bella Kennedy, MD (08/22/16 22:33:33)           Narrative:   CLINICAL DATA: Retrosternal chest pain.  EXAM: CT ANGIOGRAPHY CHEST, ABDOMEN AND PELVIS  TECHNIQUE: Multidetector CT imaging through the chest, abdomen and pelvis was performed using the standard protocol during bolus administration of intravenous contrast. Multiplanar reconstructed images and MIPs were obtained and reviewed to evaluate the vascular anatomy.  CONTRAST: 100 mL Isovue 370 IV  COMPARISON: None.  FINDINGS: CTA CHEST FINDINGS  Cardiovascular: Precontrast imaging shows no intramural hematoma. Aortic  caliber is normal. There is extensive atherosclerotic calcification of the thoracic aorta and the proximal arch vessels. There is approximately 50% narrowing of the proximal left subclavian artery. The heart size is normal. No pericardial effusion. There are coronary artery calcifications.  There is a normal 3 vessel aortic arch branch pattern. There is no dissection or ulceration.  Mediastinum/Nodes: No mediastinal, hilar or axillary lymphadenopathy. The visualized thyroid and thoracic esophageal course are unremarkable.  Lungs/Pleura: Lungs are clear. No pleural effusion or pneumothorax.  Musculoskeletal: No chest wall abnormality. No acute or significant osseous findings.  Review of the MIP images confirms the above findings.  CTA ABDOMEN AND PELVIS FINDINGS  VASCULAR  Aorta: There is atherosclerotic calcification the thoracoabdominal aorta but no stenosis, dissection or aneurysm.  Celiac: There is calcification of the celiac axis origin without hemodynamically significant stenosis.  SMA: There is mild calcification of the SMA origin without hemodynamically significant stenosis. Otherwise normal.  Renals: Both renal arteries are patent without evidence of aneurysm, dissection, vasculitis, fibromuscular dysplasia or significant stenosis. There is bilateral origin calcification without stenosis.  IMA: Patent without evidence of aneurysm, dissection, vasculitis or significant stenosis.  Inflow: Patent without evidence of aneurysm, dissection, vasculitis or significant stenosis.  Review of the MIP images confirms the above findings.  NON-VASCULAR  Hepatobiliary: There is a 1.4 cm hyperenhancing lesion within the right hepatic lobe. Liver is otherwise normal. Status post cholecystectomy.  Pancreas: Pancreatic contours are normal. No abnormal calcifications or mass lesions. No peripancreatic fluid collection. No pancreatic ductal dilatation.  Spleen:  Normal.  Adrenals/Urinary Tract: Normal adrenal glands. No hydronephrosis or solid renal mass.  Stomach/Bowel: There is sigmoid diverticulosis without acute inflammation. The remainder of the colon is normal. The appendix is normal. No small bowel dilatation or inflammation. No abdominal fluid collection.  Lymphatic: No abdominal, pelvic or mesenteric lymphadenopathy.  Reproductive: Status post hysterectomy. No adnexal mass. No free fluid in the pelvis  Other: No abdominal wall hernia or abnormality. No abdominopelvic ascites.  Musculoskeletal: Multilevel lumbar osteophytosis and facet arthrosis. There is a focal lucency at the base  of the twelfth rib without aggressive features.  Review of the MIP images confirms the above findings.  IMPRESSION: 1. No acute aortic syndrome. 2. No acute abnormality of the chest, abdomen or pelvis. 3. Enhancing lesion within the right hepatic lobe, possibly a flash filling hemangioma, but this is incompletely evaluated in the absence of delayed phase images. Nonemergent outpatient ultrasound could be performed for further characterization. 4. Sigmoid diverticulosis. 5. Aortic atherosclerosis.   Electronically Signed By: Ulyses Jarred M.D. On: 08/22/2016 22:33            CT Angio Abd/Pel W and/or Wo Contrast (Final result)  Result time 08/22/16 22:33:33  Final result by Bella Kennedy, MD (08/22/16 22:33:33)           Narrative:   CLINICAL DATA: Retrosternal chest pain.  EXAM: CT ANGIOGRAPHY CHEST, ABDOMEN AND PELVIS  TECHNIQUE: Multidetector CT imaging through the chest, abdomen and pelvis was performed using the standard protocol during bolus administration of intravenous contrast. Multiplanar reconstructed images and MIPs were obtained and reviewed to evaluate the vascular anatomy.  CONTRAST: 100 mL Isovue 370 IV  COMPARISON: None.  FINDINGS: CTA CHEST FINDINGS  Cardiovascular: Precontrast imaging shows  no intramural hematoma. Aortic caliber is normal. There is extensive atherosclerotic calcification of the thoracic aorta and the proximal arch vessels. There is approximately 50% narrowing of the proximal left subclavian artery. The heart size is normal. No pericardial effusion. There are coronary artery calcifications.  There is a normal 3 vessel aortic arch branch pattern. There is no dissection or ulceration.  Mediastinum/Nodes: No mediastinal, hilar or axillary lymphadenopathy. The visualized thyroid and thoracic esophageal course are unremarkable.  Lungs/Pleura: Lungs are clear. No pleural effusion or pneumothorax.  Musculoskeletal: No chest wall abnormality. No acute or significant osseous findings.  Review of the MIP images confirms the above findings.  CTA ABDOMEN AND PELVIS FINDINGS  VASCULAR  Aorta: There is atherosclerotic calcification the thoracoabdominal aorta but no stenosis, dissection or aneurysm.  Celiac: There is calcification of the celiac axis origin without hemodynamically significant stenosis.  SMA: There is mild calcification of the SMA origin without hemodynamically significant stenosis. Otherwise normal.  Renals: Both renal arteries are patent without evidence of aneurysm, dissection, vasculitis, fibromuscular dysplasia or significant stenosis. There is bilateral origin calcification without stenosis.  IMA: Patent without evidence of aneurysm, dissection, vasculitis or significant stenosis.  Inflow: Patent without evidence of aneurysm, dissection, vasculitis or significant stenosis.  Review of the MIP images confirms the above findings.  NON-VASCULAR  Hepatobiliary: There is a 1.4 cm hyperenhancing lesion within the right hepatic lobe. Liver is otherwise normal. Status post cholecystectomy.  Pancreas: Pancreatic contours are normal. No abnormal calcifications or mass lesions. No peripancreatic fluid collection. No pancreatic ductal  dilatation.  Spleen: Normal.  Adrenals/Urinary Tract: Normal adrenal glands. No hydronephrosis or solid renal mass.  Stomach/Bowel: There is sigmoid diverticulosis without acute inflammation. The remainder of the colon is normal. The appendix is normal. No small bowel dilatation or inflammation. No abdominal fluid collection.  Lymphatic: No abdominal, pelvic or mesenteric lymphadenopathy.  Reproductive: Status post hysterectomy. No adnexal mass. No free fluid in the pelvis  Other: No abdominal wall hernia or abnormality. No abdominopelvic ascites.  Musculoskeletal: Multilevel lumbar osteophytosis and facet arthrosis. There is a focal lucency at the base of the twelfth rib without aggressive features.  Review of the MIP images confirms the above findings.  IMPRESSION: 1. No acute aortic syndrome. 2. No acute abnormality of the chest, abdomen or pelvis. 3.  Enhancing lesion within the right hepatic lobe, possibly a flash filling hemangioma, but this is incompletely evaluated in the absence of delayed phase images. Nonemergent outpatient ultrasound could be performed for further characterization. 4. Sigmoid diverticulosis. 5. Aortic atherosclerosis.   Electronically Signed By: Ulyses Jarred M.D. On: 08/22/2016 22:33            ____________________________________________   PROCEDURES  Procedure(s) performed:   Procedures  Critical Care performed:   ____________________________________________   INITIAL IMPRESSION / ASSESSMENT AND PLAN / ED COURSE  Pertinent labs & imaging results that were available during my care of the patient were reviewed by me and considered in my medical decision making (see chart for details).   Clinical Course   ----------------------------------------- 9:45 PM on 08/22/2016 -----------------------------------------  Patient was initially made a stroke alert. I did speak with Dr. Earnestine Leys of the neurology service who  would've been evaluating the patient for TPA. The patient is within the TPA window. However, the patient has symptoms that are concerning for aortic dissection. The neurologist agrees and would argue against TPA at this time unless she has a CT angiography that was unclear. However, this is, given by the patient's contrast allergy. The patient says that she had a type of contracture to her upper extremities last time she contrast several years ago. However, she denies remembering her throat closing up or any breathing difficulty. I discussed with the radiologist, Dr. Radene Knee. He agrees that the patient needs a scan but he and the CT tech will not do this prior to one hour of prep. I explained this plan to the patient and her husband who is at bedside. They're aware of the delay in that this will put the outside the window for TPA.  ----------------------------------------- 11:20 PM on 08/22/2016 ----------------------------------------- Discussed with Dr. Ouida Sills of neurology who does not recommend TPA. The patient to Dr. Ouida Sills that her numbness has been increasing over the past several weeks. He is concerned about an insidious process and also possible relation to her seizure disorder. She'll be admitted to the hospital. [facility] administer aspirin. Patient aware of need to sit the hospital. Signed out to Dr. Jannifer Franklin of the medicine service. Patient is understanding of the plan and willing to comply.  ____________________________________________   FINAL CLINICAL IMPRESSION(S) / ED DIAGNOSES  Final diagnoses:  Numbness  Chest Pain Left upper extremity weakness   NEW MEDICATIONS STARTED DURING THIS VISIT:  New Prescriptions   No medications on file     Note:  This document was prepared using Dragon voice recognition software and may include unintentional dictation errors.    Orbie Pyo, MD 08/22/16 [phone]

## 2016-08-22 NOTE — ED Notes (Signed)
While ECG being performed; RN gathered further information pertinent to the HPI. Patient reporting retrosternal CP, LEFT facial numbness, and LEFT upper extremity numbness that began with acute onset at 1900 tonight. Patient stating that she experienced a syncopal episode; unable to advised as to the length of time associated with the LOC period. This information will be relayed to CT, EDP, and primary nurse.

## 2016-08-22 NOTE — ED Notes (Signed)
ED Provider at bedside. 

## 2016-08-22 NOTE — ED Notes (Signed)
Patient arrives to the ED via POV; this RN assisted patient into w/c; some lower extremity weakness noted upon standing; patient complains of significant vertiginous symptoms. While being transferred into the w/c, HPI information obtained from patient and her spouse. Of note, there is no facial droop or considerable dysarthria appreciated. Patient taken into the main ED; registration bypassed in expedite treatment. ED charge nurse made aware of need for room. EDT performed ECG while patient being put into CHL by this RN; CT ordered at this time. Patient to CT; this RN accompanied.

## 2016-08-23 ENCOUNTER — Encounter: Payer: Self-pay | Admitting: Internal Medicine

## 2016-08-23 ENCOUNTER — Observation Stay: Payer: Medicare HMO

## 2016-08-23 ENCOUNTER — Observation Stay (HOSPITAL_BASED_OUTPATIENT_CLINIC_OR_DEPARTMENT_OTHER)
Admit: 2016-08-23 | Discharge: 2016-08-23 | Disposition: A | Discharge status: Home / Self Care | Payer: Medicare HMO | Attending: Internal Medicine | Admitting: Internal Medicine

## 2016-08-23 DIAGNOSIS — G459 Transient cerebral ischemic attack, unspecified: Secondary | ICD-10-CM | POA: Diagnosis not present

## 2016-08-23 DIAGNOSIS — R29898 Other symptoms and signs involving the musculoskeletal system: Secondary | ICD-10-CM

## 2016-08-23 DIAGNOSIS — R079 Chest pain, unspecified: Secondary | ICD-10-CM | POA: Diagnosis present

## 2016-08-23 LAB — GLUCOSE, CAPILLARY
GLUCOSE-CAPILLARY: 129 mg/dL — AB (ref 65–99)
Glucose-Capillary: 164 mg/dL — ABNORMAL HIGH (ref 65–99)
Glucose-Capillary: 165 mg/dL — ABNORMAL HIGH (ref 65–99)

## 2016-08-23 LAB — LIPID PANEL
CHOL/HDL RATIO: 3.1 ratio
CHOLESTEROL: 139 mg/dL (ref 0–200)
HDL: 45 mg/dL (ref >40)
LDL Cholesterol: 81 mg/dL (ref 0–99)
Triglycerides: 64 mg/dL (ref <150)
VLDL: 13 mg/dL (ref 0–40)

## 2016-08-23 LAB — CBC
HEMATOCRIT: 34.9 % — AB (ref 35.0–47.0)
HEMOGLOBIN: 11.9 g/dL — AB (ref 12.0–16.0)
MCH: 30.4 pg (ref 26.0–34.0)
MCHC: 34.1 g/dL (ref 32.0–36.0)
MCV: 89.3 fL (ref 80.0–100.0)
Platelets: 219 10*3/uL (ref 150–440)
RBC: 3.91 MIL/uL (ref 3.80–5.20)
RDW: 14.3 % (ref 11.5–14.5)
WBC: 10.4 10*3/uL (ref 3.6–11.0)

## 2016-08-23 LAB — ECHOCARDIOGRAM COMPLETE
Height: 59 in
Weight: 2336 oz

## 2016-08-23 LAB — TROPONIN I

## 2016-08-23 LAB — BASIC METABOLIC PANEL
ANION GAP: 10 (ref 5–15)
BUN: 22 mg/dL — AB (ref 6–20)
CALCIUM: 8.3 mg/dL — AB (ref 8.9–10.3)
CO2: 24 mmol/L (ref 22–32)
Chloride: 104 mmol/L (ref 101–111)
Creatinine, Ser: 0.74 mg/dL (ref 0.44–1.00)
GFR calc Af Amer: >60 mL/min (ref >60)
Glucose, Bld: 177 mg/dL — ABNORMAL HIGH (ref 65–99)
POTASSIUM: 3.8 mmol/L (ref 3.5–5.1)
Sodium: 138 mmol/L (ref 135–145)

## 2016-08-23 MED ORDER — SODIUM CHLORIDE 0.9% FLUSH
3.0000 mL | Freq: Two times a day (BID) | INTRAVENOUS | Status: DC
  Administered 2016-08-23 (×2): 3 mL via INTRAVENOUS

## 2016-08-23 MED ORDER — GABAPENTIN 600 MG PO TABS
600.0000 mg | ORAL_TABLET | Freq: Three times a day (TID) | ORAL | Status: DC
  Administered 2016-08-23: 600 mg via ORAL
  Filled 2016-08-23 (×3): qty 1

## 2016-08-23 MED ORDER — INSULIN ASPART 100 UNIT/ML ~~LOC~~ SOLN
0.0000 [IU] | Freq: Three times a day (TID) | SUBCUTANEOUS | Status: DC
  Administered 2016-08-23: 2 [IU] via SUBCUTANEOUS
  Filled 2016-08-23: qty 2

## 2016-08-23 MED ORDER — ASPIRIN EC 81 MG PO TBEC: 81.0000 mg | DELAYED_RELEASE_TABLET | Freq: Every day | ORAL | 0 refills | Status: DC

## 2016-08-23 MED ORDER — PANTOPRAZOLE SODIUM 40 MG PO TBEC
40.0000 mg | DELAYED_RELEASE_TABLET | Freq: Every day | ORAL | Status: DC
  Administered 2016-08-23: 40 mg via ORAL
  Filled 2016-08-23: qty 1

## 2016-08-23 MED ORDER — OXYCODONE-ACETAMINOPHEN 5-325 MG PO TABS: 1.0000 | ORAL_TABLET | Freq: Four times a day (QID) | ORAL | Status: DC | PRN

## 2016-08-23 MED ORDER — PNEUMOCOCCAL VAC POLYVALENT 25 MCG/0.5ML IJ INJ
0.5000 mL | INJECTION | Freq: Once | INTRAMUSCULAR | Status: AC
  Administered 2016-08-23: 0.5 mL via INTRAMUSCULAR
  Filled 2016-08-23: qty 0.5

## 2016-08-23 MED ORDER — ONDANSETRON HCL 4 MG/2ML IJ SOLN: 4.0000 mg | Freq: Four times a day (QID) | INTRAMUSCULAR | Status: DC | PRN

## 2016-08-23 MED ORDER — ENOXAPARIN SODIUM 40 MG/0.4ML ~~LOC~~ SOLN: 40.0000 mg | SUBCUTANEOUS | Status: DC

## 2016-08-23 MED ORDER — ONDANSETRON HCL 4 MG PO TABS: 4.0000 mg | ORAL_TABLET | Freq: Four times a day (QID) | ORAL | Status: DC | PRN

## 2016-08-23 MED ORDER — STROKE: EARLY STAGES OF RECOVERY BOOK
Freq: Once | Status: AC
  Administered 2016-08-23: 03:00:00

## 2016-08-23 MED ORDER — PNEUMOCOCCAL VAC POLYVALENT 25 MCG/0.5ML IJ INJ: 0.5000 mL | INJECTION | INTRAMUSCULAR | Status: DC

## 2016-08-23 MED ORDER — TRAZODONE HCL 100 MG PO TABS: 100.0000 mg | ORAL_TABLET | Freq: Every day | ORAL | Status: DC

## 2016-08-23 MED ORDER — INSULIN ASPART 100 UNIT/ML ~~LOC~~ SOLN: 0.0000 [IU] | Freq: Every day | SUBCUTANEOUS | Status: DC

## 2016-08-23 MED ORDER — ACETAMINOPHEN 325 MG PO TABS: 650.0000 mg | ORAL_TABLET | Freq: Four times a day (QID) | ORAL | Status: DC | PRN

## 2016-08-23 MED ORDER — ACETAMINOPHEN 650 MG RE SUPP: 650.0000 mg | Freq: Four times a day (QID) | RECTAL | Status: DC | PRN

## 2016-08-23 NOTE — Discharge Summary (Signed)
[facility] [place] NAME: [name] [name]    MR#:  [id number]  DATE OF BIRTH:  03/25/1941  DATE OF ADMISSION:  08/22/2016 ADMITTING PHYSICIAN: Lance Coon, MD  DATE OF DISCHARGE: 08/23/2016  PRIMARY CARE PHYSICIAN: Kirk Ruths., MD    ADMISSION DIAGNOSIS:  Syncope and collapse [R55] Numbness [R20.0] Chest pain, unspecified type [R07.9]  DISCHARGE DIAGNOSIS:  Principal Problem:   Chest pain Active Problems:   Diabetes (HCC)   Dyslipidemia   BP (high blood pressure)   Weakness of left upper extremity   TIA  SECONDARY DIAGNOSIS:   Past Medical History:  Diagnosis Date  . Anemia   . Arthritis   . Cancer ([place])    melanoma  . Chronic kidney disease    stage 3  . Complication of anesthesia    sometimes hard to wake up  . Diabetes ([place])    diet controlled  . 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 GERD (gastroesophageal reflux disease)   . Headache   . Hyperlipemia   . Hypertension   . Pain    chronic  . Rosacea   . 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 Sleep apnea    cpap    HOSPITAL COURSE:   * Chest pain - patient had an episode of chest pain shortly after her left upper extremity symptoms started, and that she had a syncope. She is currently chest pain-free.   troponin and tele monitoring remains event free.   Echo done- awaited report.  *  Weakness of left upper extremity , TIA - persistent weakness and numbness of the left upper extremity, CT head showed no abnormality,    MRI brain- no abnormalities.   Her weakness is resolved.   Appreciated help by neurologist, will give ASA    *  Diabetes ([place]) - sliding scale insulin with corresponding glucose checks *  BP (high blood pressure) -    Now normal on home meds.  *  Dyslipidemia - continue home meds  * small hyperenhanced lesion on liver- found on CT   Need US liver- non urgent as out pt.  DISCHARGE  CONDITIONS:   Stable.  CONSULTS OBTAINED:  Treatment Team:  Alexis Goodell, MD  DRUG ALLERGIES:   Allergies  Allergen Reactions  . Contrast Media [Iodinated Diagnostic Agents] Other (See Comments)    Made hands and feet "draw up"  . Tramadol Other (See Comments)    Other Reaction: OTHER REACTION, SOB, AGITATIO  . Adhesive [Tape] Rash    Some bandaids  . Penicillins Rash and Other (See Comments)    Has patient had a PCN reaction causing immediate rash, facial/tongue/throat swelling, SOB or lightheadedness with hypotension: Yes Has patient had a PCN reaction causing severe rash involving mucus membranes or skin necrosis: No Has patient had a PCN reaction that required hospitalization No Has patient had a PCN reaction occurring within the last 10 years: No If all of the above answers are "NO", then may proceed with Cephalosporin use.     DISCHARGE MEDICATIONS:   Current Discharge Medication List    START taking these medications   Details  aspirin EC 81 MG tablet Take 1 tablet (81 mg total) by mouth daily. Qty: 30 tablet, Refills: 0      CONTINUE these medications which have NOT CHANGED   Details  acetaminophen (TYLENOL) 500 MG tablet Take 500 mg  by mouth every 8 (eight) hours as needed for mild pain.     carvedilol (COREG) 12.5 MG tablet Take 12.5 mg by mouth 2 (two) times daily with a meal.    gabapentin (NEURONTIN) 600 MG tablet 600 mg 3 (three) times daily.     losartan-hydrochlorothiazide (HYZAAR) 100-25 MG tablet Take 1 tablet by mouth daily.     omeprazole (PRILOSEC) 40 MG capsule Take 40 mg by mouth daily.     oxyCODONE-acetaminophen (ROXICET) 5-325 MG tablet Take 1 tablet by mouth every 6 (six) hours as needed. Qty: 20 tablet, Refills: 0    traZODone (DESYREL) 50 MG tablet Take 100 mg by mouth at bedtime.          DISCHARGE INSTRUCTIONS:    Follow with PMD in 2 weeks.  If you experience worsening of your admission symptoms, develop shortness of  breath, life threatening emergency, suicidal or homicidal thoughts you must seek medical attention immediately by calling 911 or calling your MD immediately  if symptoms less severe.  You Must read complete instructions/literature along with all the possible adverse reactions/side effects for all the Medicines you take and that have been prescribed to you. Take any new Medicines after you have completely understood and accept all the possible adverse reactions/side effects.   Please note  You were cared for by a hospitalist during your hospital stay. If you have any questions about your discharge medications or the care you received while you were in the hospital after you are discharged, you can call the unit and asked to speak with the hospitalist on call if the hospitalist that took care of you is not available. Once you are discharged, your primary care physician will handle any further medical issues. Please note that NO REFILLS for any discharge medications will be authorized once you are discharged, as it is imperative that you return to your primary care physician (or establish a relationship with a primary care physician if you do not have one) for your aftercare needs so that they can reassess your need for medications and monitor your lab values.    Today   CHIEF COMPLAINT:  No chief complaint on file.   HISTORY OF PRESENT ILLNESS:  [name] [name]  is a 76 y.o. female presents with An episode of left upper extremity numbness and weakness followed by an episode of severe central chest pain and syncope. Chest pain is currently resolved, but left her pressure may numbness and weakness persists. Patient was initially considered for TPA, but with the chest. There was some concern for possible dissection as the cause of all this. She had CTA chest done which did not show any PE or dissection. She is currently outside the window for TPA at this time. Hospitalists were called for admission and  further evaluation.   VITAL SIGNS:  Blood pressure (!) 120/53, pulse 60, temperature 98.8 F (37.1 C), temperature source Oral, resp. rate 16, height 4\' 11"  (1.499 m), weight 66.2 kg (146 lb), SpO2 99 %.  I/O:   Intake/Output Summary (Last 24 hours) at 08/23/16 1259 Last data filed at 08/23/16 1249  Gross per 24 hour  Intake              243 ml  Output              650 ml  Net             -407 ml    PHYSICAL EXAMINATION:  GENERAL:  76 y.o.-year-old patient  lying in the bed with no acute distress.  EYES: Pupils equal, round, reactive to light and accommodation. No scleral icterus. Extraocular muscles intact.  HEENT: Head atraumatic, normocephalic. Oropharynx and nasopharynx clear.  NECK:  Supple, no jugular venous distention. No thyroid enlargement, no tenderness.  LUNGS: Normal breath sounds bilaterally, no wheezing, rales,rhonchi or crepitation. No use of accessory muscles of respiration.  CARDIOVASCULAR: S1, S2 normal. No murmurs, rubs, or gallops.  ABDOMEN: Soft, non-tender, non-distended. Bowel sounds present. No organomegaly or mass.  EXTREMITIES: No pedal edema, cyanosis, or clubbing.  NEUROLOGIC: Cranial nerves II through XII are intact. Muscle strength 5/5 in all extremities. Sensation intact. Gait not checked.  PSYCHIATRIC: The patient is alert and oriented x 3.  SKIN: No obvious rash, lesion, or ulcer.   DATA REVIEW:   CBC  Recent Labs Lab 08/23/16 0837  WBC 10.4  HGB 11.9*  HCT 34.9*  PLT 219    Chemistries   Recent Labs Lab 08/22/16 2022 08/23/16 0837  NA 139 138  K 4.0 3.8  CL 104 104  CO2 27 24  GLUCOSE 103* 177*  BUN 13 22*  CREATININE 0.67 0.74  CALCIUM 8.7* 8.3*  AST 31  --   ALT 27  --   ALKPHOS 97  --   BILITOT 0.6  --     Cardiac Enzymes  Recent Labs Lab 08/23/16 0837  TROPONINI <0.03    Microbiology Results  Results for orders placed or performed during the hospital encounter of 01/30/16  C difficile quick scan w PCR reflex      Status: None   Collection Time: 01/29/16  2:30 PM  Result Value Ref Range Status   C Diff antigen NEGATIVE NEGATIVE Final   C Diff toxin NEGATIVE NEGATIVE Final   C Diff interpretation Negative for C. difficile  Final  Gastrointestinal Panel by PCR , Stool     Status: None   Collection Time: 01/29/16  2:30 PM  Result Value Ref Range Status   Campylobacter species NOT DETECTED NOT DETECTED Final   Plesimonas shigelloides NOT DETECTED NOT DETECTED Final   Salmonella species NOT DETECTED NOT DETECTED Final   Yersinia enterocolitica NOT DETECTED NOT DETECTED Final   Vibrio species NOT DETECTED NOT DETECTED Final   Vibrio cholerae NOT DETECTED NOT DETECTED Final   Enteroaggregative E coli (EAEC) NOT DETECTED NOT DETECTED Final   Enteropathogenic E coli (EPEC) NOT DETECTED NOT DETECTED Final   Enterotoxigenic E coli (ETEC) NOT DETECTED NOT DETECTED Final   Shiga like toxin producing E coli (STEC) NOT DETECTED NOT DETECTED Final   E. coli O157 NOT DETECTED NOT DETECTED Final   Shigella/Enteroinvasive E coli (EIEC) NOT DETECTED NOT DETECTED Final   Cryptosporidium NOT DETECTED NOT DETECTED Final   Cyclospora cayetanensis NOT DETECTED NOT DETECTED Final   Entamoeba histolytica NOT DETECTED NOT DETECTED Final   Giardia lamblia NOT DETECTED NOT DETECTED Final   Adenovirus F40/41 NOT DETECTED NOT DETECTED Final   Astrovirus NOT DETECTED NOT DETECTED Final   Norovirus GI/GII NOT DETECTED NOT DETECTED Final   Rotavirus A NOT DETECTED NOT DETECTED Final   Sapovirus (I, II, IV, and V) NOT DETECTED NOT DETECTED Final    RADIOLOGY:  Dg Chest 1 View  Result Date: 08/22/2016 CLINICAL DATA:  Retrosternal chest pain EXAM: CHEST 1 VIEW COMPARISON:  07/04/2016 FINDINGS: Stable mild cardiomegaly without overt failure. No acute infiltrate or effusion. Atherosclerosis. No pneumothorax. IMPRESSION: Stable cardiomegaly without overt failure. Electronically Signed   By: Maudie Mercury  Francoise Ceo M.D.   On: 08/22/2016  21:00   Mr Brain Wo Contrast  Result Date: 08/23/2016 CLINICAL DATA:  Left upper extremity numbness and weakness. EXAM: MRI HEAD WITHOUT CONTRAST MRA HEAD WITHOUT CONTRAST TECHNIQUE: Multiplanar, multiecho pulse sequences of the brain and surrounding structures were obtained without intravenous contrast. Angiographic images of the head were obtained using MRA technique without contrast. COMPARISON:  None. FINDINGS: MRI HEAD FINDINGS Brain: Diffusion-weighted images demonstrate no acute or subacute infarction. Scattered subcortical T2 hyperintensities are greater than expected for age. There is no significant atrophy. The ventricles are of normal size. Brainstem and cerebellum is normal. The internal auditory canals are within normal limits bilaterally. Vascular: Flow is present in the major intracranial arteries. Skull and upper cervical spine: The skullbase is normal. The craniocervical junction is normal. Marrow signal is within normal limits. Slight anterolisthesis is present at C3-4 and C4-5. Midline sagittal structures are otherwise within normal limits. Sinuses/Orbits: Mild mucosal thickening is present along the floor of the maxillary sinuses bilaterally. Paranasal sinuses and mastoid air cells are otherwise clear. The globes and orbits are within normal limits. MRA HEAD FINDINGS The internal carotid arteries are within normal limits from the high cervical segments through the ICA termini bilaterally. The A1 and M1 segments are normal. The MCA bifurcations are intact. ACA and MCA branch vessels are within normal limits. The left vertebral artery is the dominant vessel. The right vertebral artery is not visualized. It appears hypoplastic on the T2 weighted images. Basilar artery is normal. Both posterior cerebral arteries originate from basilar tip. The PCA branch vessels are within normal limits. IMPRESSION: 1. No acute intracranial abnormality. 2. Moderate diffuse subcortical T2 hyperintensities  bilaterally are greater than expected for age. The finding is nonspecific but can be seen in the setting of chronic microvascular ischemia, a demyelinating process such as multiple sclerosis, vasculitis, complicated migraine headaches, or as the sequelae of a prior infectious or inflammatory process. 3. Minimal maxillary sinus disease. 4. Normal variant MRA circle of Willis without significant proximal stenosis, aneurysm, or branch vessel occlusion. Electronically Signed   By: San Morelle M.D.   On: 08/23/2016 11:00   US Carotid Bilateral (at [facility] Only)  Result Date: 08/23/2016 CLINICAL DATA:  Intermittent weakness involving the left upper extremity. EXAM: BILATERAL CAROTID DUPLEX ULTRASOUND TECHNIQUE: Pearline Cables scale imaging, color Doppler and duplex ultrasound were performed of bilateral carotid and vertebral arteries in the neck. COMPARISON:  None. FINDINGS: Criteria: Quantification of carotid stenosis is based on velocity parameters that correlate the residual internal carotid diameter with NASCET-based stenosis levels, using the diameter of the distal internal carotid lumen as the denominator for stenosis measurement. The following velocity measurements were obtained: RIGHT ICA:  118/18 cm/sec CCA:  [id number] cm/sec SYSTOLIC ICA/CCA RATIO:  0.9 DIASTOLIC ICA/CCA RATIO:  1.0 ECA:  173 cm/sec LEFT ICA:  135/12 cm/sec CCA:  [id number] cm/sec SYSTOLIC ICA/CCA RATIO:  1.2 DIASTOLIC ICA/CCA RATIO:  0.6 ECA:  145 cm/sec RIGHT CAROTID ARTERY: There is a minimal amount of eccentric mixed echogenic plaque within the right carotid bulb or image 16), extending to involve the origin and proximal aspect the right internal carotid artery (image 24), not resulting in elevated peak systolic velocities within the interrogated course the right internal carotid artery to suggest a hemodynamically significant stenosis. RIGHT VERTEBRAL ARTERY:  Antegrade Flow LEFT CAROTID ARTERY: There is a moderate to large amount of  eccentric mixed echogenic plaque within the left carotid bulb (image 50), extending to involve the  origin and proximal aspect the left internal carotid artery or 59), resulting in elevated peak systolic velocities within the proximal aspect the left internal carotid artery (greatest acquired peak systolic velocity with the proximal ICA measures 135 cm/sec - image 60), not resulting in elevated peak systolic velocities within the interrogated course of the left internal carotid artery to suggest a hemodynamically significant stenosis. LEFT VERTEBRAL ARTERY:  Antegrade Flow IMPRESSION: 1. Moderate to large amount of left-sided atherosclerotic plaque results in elevated peak systolic velocities within the left internal carotid artery compatible with the lower end of the 50-69% luminal narrowing range. Further evaluation with CTA could performed as clinically indicated. 2. Minimal amount of right-sided atherosclerotic plaque, not resulting in a hemodynamically significant stenosis. Electronically Signed   By: Sandi Mariscal M.D.   On: 08/23/2016 09:32   Mr Jodene Nam Headm  Result Date: 08/23/2016 CLINICAL DATA:  Left upper extremity numbness and weakness. EXAM: MRI HEAD WITHOUT CONTRAST MRA HEAD WITHOUT CONTRAST TECHNIQUE: Multiplanar, multiecho pulse sequences of the brain and surrounding structures were obtained without intravenous contrast. Angiographic images of the head were obtained using MRA technique without contrast. COMPARISON:  None. FINDINGS: MRI HEAD FINDINGS Brain: Diffusion-weighted images demonstrate no acute or subacute infarction. Scattered subcortical T2 hyperintensities are greater than expected for age. There is no significant atrophy. The ventricles are of normal size. Brainstem and cerebellum is normal. The internal auditory canals are within normal limits bilaterally. Vascular: Flow is present in the major intracranial arteries. Skull and upper cervical spine: The skullbase is normal. The  craniocervical junction is normal. Marrow signal is within normal limits. Slight anterolisthesis is present at C3-4 and C4-5. Midline sagittal structures are otherwise within normal limits. Sinuses/Orbits: Mild mucosal thickening is present along the floor of the maxillary sinuses bilaterally. Paranasal sinuses and mastoid air cells are otherwise clear. The globes and orbits are within normal limits. MRA HEAD FINDINGS The internal carotid arteries are within normal limits from the high cervical segments through the ICA termini bilaterally. The A1 and M1 segments are normal. The MCA bifurcations are intact. ACA and MCA branch vessels are within normal limits. The left vertebral artery is the dominant vessel. The right vertebral artery is not visualized. It appears hypoplastic on the T2 weighted images. Basilar artery is normal. Both posterior cerebral arteries originate from basilar tip. The PCA branch vessels are within normal limits. IMPRESSION: 1. No acute intracranial abnormality. 2. Moderate diffuse subcortical T2 hyperintensities bilaterally are greater than expected for age. The finding is nonspecific but can be seen in the setting of chronic microvascular ischemia, a demyelinating process such as multiple sclerosis, vasculitis, complicated migraine headaches, or as the sequelae of a prior infectious or inflammatory process. 3. Minimal maxillary sinus disease. 4. Normal variant MRA circle of Willis without significant proximal stenosis, aneurysm, or branch vessel occlusion. Electronically Signed   By: San Morelle M.D.   On: 08/23/2016 11:00   Ct Angio Chest Aorta W And/or Wo Contrast  Result Date: 08/22/2016 CLINICAL DATA:  Retrosternal chest pain. EXAM: CT ANGIOGRAPHY CHEST, ABDOMEN AND PELVIS TECHNIQUE: Multidetector CT imaging through the chest, abdomen and pelvis was performed using the standard protocol during bolus administration of intravenous contrast. Multiplanar reconstructed images and  MIPs were obtained and reviewed to evaluate the vascular anatomy. CONTRAST:  100 mL Isovue 370 IV COMPARISON:  None. FINDINGS: CTA CHEST FINDINGS Cardiovascular: Precontrast imaging shows no intramural hematoma. Aortic caliber is normal. There is extensive atherosclerotic calcification of the thoracic aorta and the proximal  arch vessels. There is approximately 50% narrowing of the proximal left subclavian artery. The heart size is normal. No pericardial effusion. There are coronary artery calcifications. There is a normal 3 vessel aortic arch branch pattern. There is no dissection or ulceration. Mediastinum/Nodes: No mediastinal, hilar or axillary lymphadenopathy. The visualized thyroid and thoracic esophageal course are unremarkable. Lungs/Pleura: Lungs are clear. No pleural effusion or pneumothorax. Musculoskeletal: No chest wall abnormality. No acute or significant osseous findings. Review of the MIP images confirms the above findings. CTA ABDOMEN AND PELVIS FINDINGS VASCULAR Aorta: There is atherosclerotic calcification the thoracoabdominal aorta but no stenosis, dissection or aneurysm. Celiac: There is calcification of the celiac axis origin without hemodynamically significant stenosis. SMA: There is mild calcification of the SMA origin without hemodynamically significant stenosis. Otherwise normal. Renals: Both renal arteries are patent without evidence of aneurysm, dissection, vasculitis, fibromuscular dysplasia or significant stenosis. There is bilateral origin calcification without stenosis. IMA: Patent without evidence of aneurysm, dissection, vasculitis or significant stenosis. Inflow: Patent without evidence of aneurysm, dissection, vasculitis or significant stenosis. Review of the MIP images confirms the above findings. NON-VASCULAR Hepatobiliary: There is a 1.4 cm hyperenhancing lesion within the right hepatic lobe. Liver is otherwise normal. Status post cholecystectomy. Pancreas: Pancreatic contours  are normal. No abnormal calcifications or mass lesions. No peripancreatic fluid collection. No pancreatic ductal dilatation. Spleen: Normal. Adrenals/Urinary Tract: Normal adrenal glands. No hydronephrosis or solid renal mass. Stomach/Bowel: There is sigmoid diverticulosis without acute inflammation. The remainder of the colon is normal. The appendix is normal. No small bowel dilatation or inflammation. No abdominal fluid collection. Lymphatic: No abdominal, pelvic or mesenteric lymphadenopathy. Reproductive: Status post hysterectomy. No adnexal mass. No free fluid in the pelvis Other: No abdominal wall hernia or abnormality. No abdominopelvic ascites. Musculoskeletal: Multilevel lumbar osteophytosis and facet arthrosis. There is a focal lucency at the base of the twelfth rib without aggressive features. Review of the MIP images confirms the above findings. IMPRESSION: 1. No acute aortic syndrome. 2. No acute abnormality of the chest, abdomen or pelvis. 3. Enhancing lesion within the right hepatic lobe, possibly a flash filling hemangioma, but this is incompletely evaluated in the absence of delayed phase images. Nonemergent outpatient ultrasound could be performed for further characterization. 4. Sigmoid diverticulosis. 5. Aortic atherosclerosis. Electronically Signed   By: Ulyses Jarred M.D.   On: 08/22/2016 22:33   Ct Head Code Stroke W/o Cm  Result Date: 08/22/2016 CLINICAL DATA:  Code stroke. Right upper extremity numbness, chest pain, and syncope. EXAM: CT HEAD WITHOUT CONTRAST TECHNIQUE: Contiguous axial images were obtained from the base of the skull through the vertex without intravenous contrast. COMPARISON:  02/02/2006 CT head. FINDINGS: Brain: No evidence of acute infarction, hemorrhage, hydrocephalus, extra-axial collection or mass lesion/mass effect. Few foci of hypoattenuation in subcortical white matter compatible with mild chronic microvascular ischemic changes. Vascular: No hyperdense vessel.  Mild calcific atherosclerosis of cavernous internal carotid arteries. Skull: Chronic left lower orbit fracture with mesh repair. Calvarium is unremarkable. Sinuses/Orbits: No acute finding. Other: Choose ASPECTS ([place] Stroke Program Early CT Score) - Ganglionic level infarction (caudate, lentiform nuclei, internal capsule, insula, M1-M3 cortex): 7 - Supraganglionic infarction (M4-M6 cortex): 3 Total score (0-10 with 10 being normal): 10 IMPRESSION: 1. No acute intracranial abnormality identified. If symptoms persist or if clinically indicated MRI is more sensitive for acute stroke. 2. ASPECTS is 10 These results were called by telephone at the time of interpretation on 08/22/2016 at 8:17 pm to Dr. Conni Slipper , who verbally  acknowledged these results. Electronically Signed   By: Kristine Garbe M.D.   On: 08/22/2016 20:17   Ct Angio Abd/pel W And/or Wo Contrast  Result Date: 08/22/2016 CLINICAL DATA:  Retrosternal chest pain. EXAM: CT ANGIOGRAPHY CHEST, ABDOMEN AND PELVIS TECHNIQUE: Multidetector CT imaging through the chest, abdomen and pelvis was performed using the standard protocol during bolus administration of intravenous contrast. Multiplanar reconstructed images and MIPs were obtained and reviewed to evaluate the vascular anatomy. CONTRAST:  100 mL Isovue 370 IV COMPARISON:  None. FINDINGS: CTA CHEST FINDINGS Cardiovascular: Precontrast imaging shows no intramural hematoma. Aortic caliber is normal. There is extensive atherosclerotic calcification of the thoracic aorta and the proximal arch vessels. There is approximately 50% narrowing of the proximal left subclavian artery. The heart size is normal. No pericardial effusion. There are coronary artery calcifications. There is a normal 3 vessel aortic arch branch pattern. There is no dissection or ulceration. Mediastinum/Nodes: No mediastinal, hilar or axillary lymphadenopathy. The visualized thyroid and thoracic esophageal course are  unremarkable. Lungs/Pleura: Lungs are clear. No pleural effusion or pneumothorax. Musculoskeletal: No chest wall abnormality. No acute or significant osseous findings. Review of the MIP images confirms the above findings. CTA ABDOMEN AND PELVIS FINDINGS VASCULAR Aorta: There is atherosclerotic calcification the thoracoabdominal aorta but no stenosis, dissection or aneurysm. Celiac: There is calcification of the celiac axis origin without hemodynamically significant stenosis. SMA: There is mild calcification of the SMA origin without hemodynamically significant stenosis. Otherwise normal. Renals: Both renal arteries are patent without evidence of aneurysm, dissection, vasculitis, fibromuscular dysplasia or significant stenosis. There is bilateral origin calcification without stenosis. IMA: Patent without evidence of aneurysm, dissection, vasculitis or significant stenosis. Inflow: Patent without evidence of aneurysm, dissection, vasculitis or significant stenosis. Review of the MIP images confirms the above findings. NON-VASCULAR Hepatobiliary: There is a 1.4 cm hyperenhancing lesion within the right hepatic lobe. Liver is otherwise normal. Status post cholecystectomy. Pancreas: Pancreatic contours are normal. No abnormal calcifications or mass lesions. No peripancreatic fluid collection. No pancreatic ductal dilatation. Spleen: Normal. Adrenals/Urinary Tract: Normal adrenal glands. No hydronephrosis or solid renal mass. Stomach/Bowel: There is sigmoid diverticulosis without acute inflammation. The remainder of the colon is normal. The appendix is normal. No small bowel dilatation or inflammation. No abdominal fluid collection. Lymphatic: No abdominal, pelvic or mesenteric lymphadenopathy. Reproductive: Status post hysterectomy. No adnexal mass. No free fluid in the pelvis Other: No abdominal wall hernia or abnormality. No abdominopelvic ascites. Musculoskeletal: Multilevel lumbar osteophytosis and facet arthrosis.  There is a focal lucency at the base of the twelfth rib without aggressive features. Review of the MIP images confirms the above findings. IMPRESSION: 1. No acute aortic syndrome. 2. No acute abnormality of the chest, abdomen or pelvis. 3. Enhancing lesion within the right hepatic lobe, possibly a flash filling hemangioma, but this is incompletely evaluated in the absence of delayed phase images. Nonemergent outpatient ultrasound could be performed for further characterization. 4. Sigmoid diverticulosis. 5. Aortic atherosclerosis. Electronically Signed   By: Ulyses Jarred M.D.   On: 08/22/2016 22:33    EKG:   Orders placed or performed during the hospital encounter of 08/22/16  . EKG 12-Lead  . EKG 12-Lead      Management plans discussed with the patient, family and they are in agreement.  CODE STATUS:     Code Status Orders        Start     Ordered   08/23/16 0227  Full code  Continuous     08/23/16 0227  Code Status History    Date Active Date Inactive Code Status Order ID Comments User Context   This patient has a current code status but no historical code status.    Advance Directive Documentation   Fate Most Recent Value  Type of Advance Directive  Living will  Pre-existing out of facility DNR order (yellow form or pink MOST form)  No data  "MOST" Form in Place?  No data      TOTAL TIME TAKING CARE OF THIS PATIENT: 35 minutes.    Vaughan Basta M.D on 08/23/2016 at 12:59 PM  Between 7am to 6pm - Pager - [phone]  After 6pm go to [email or web address] - password [email or web address] [facility]  Office  [phone]  CC: Primary care physician; Kirk Ruths., MD   Note: This dictation was prepared with Dragon dictation along with smaller phrase technology. Any transcriptional errors that result from this process are unintentional.

## 2016-08-23 NOTE — Consult Note (Signed)
Reason for Consult:Left arm numbness and weakness.   Referring Physician: Anselm Jungling  CC: Left arm numbness and weakness  HPI: [name] [name] is an 76 y.o. female who reports that while washing dishes had the acute onset of severe chest pain.  Patient then passed out.  When she came to noted numbness in her left hand.  Today it is improved to numbness in the last three fingers on the left hand.   Patient had shingles in November and has been recovering.  Shingles was along the scapula and under the arm.   Patient has a history of seizures but presentation unlike this presentation.    Past Medical History:  Diagnosis Date  . Anemia   . Arthritis   . Cancer ([place])    melanoma  . Chronic kidney disease    stage 3  . Complication of anesthesia    sometimes hard to wake up  . Diabetes ([place])    diet controlled  . 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 GERD (gastroesophageal reflux disease)   . Headache   . Hyperlipemia   . Hypertension   . Pain    chronic  . Rosacea   . 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 Sleep apnea    cpap    Past Surgical History:  Procedure Laterality Date  . ABDOMINAL HYSTERECTOMY    . BACK SURGERY     lumbar fusion, laminectomy  . BLEPHAROPLASTY Bilateral 10/24/14   Dr. Loni Muse. Vickki Muff, MBSC  . BREAST SURGERY     reduction  . CHOLECYSTECTOMY    . COLONOSCOPY WITH PROPOFOL N/A 03/17/2016   Procedure: COLONOSCOPY WITH PROPOFOL;  Surgeon: Manya Silvas, MD;  Location: [facility] ENDOSCOPY;  Service: Endoscopy;  Laterality: N/A;  . rcr    . REDUCTION MAMMAPLASTY Bilateral 1985  . SHOULDER ARTHROSCOPY WITH OPEN ROTATOR CUFF REPAIR Right 06/22/2015   Procedure: SHOULDER ARTHROSCOPY WITH SUBACROMIAL DECOMPRESSION, RELEASE OF LONG HEAD OF BICEPS TENDON, RESECTION OF DISTAL CLAVICLE;  Surgeon: Leanor Kail, MD;  Location: [facility];  Service: Orthopedics;  Laterality: Right;  DIABETIC - diet controlled CPAP   . SKIN  CANCER EXCISION     leg, back    Family History  Problem Relation Age of Onset  . Aneurysm Father   . Hypertension Father   . Breast cancer Neg Hx     Social History:  reports that she has never smoked. She has never used smokeless tobacco. She reports that she does not drink alcohol or use drugs.  Allergies  Allergen Reactions  . Contrast Media [Iodinated Diagnostic Agents] Other (See Comments)    Made hands and feet "draw up"  . Tramadol Other (See Comments)    Other Reaction: OTHER REACTION, SOB, AGITATIO  . Adhesive [Tape] Rash    Some bandaids  . Penicillins Rash and Other (See Comments)    Has patient had a PCN reaction causing immediate rash, facial/tongue/throat swelling, SOB or lightheadedness with hypotension: Yes Has patient had a PCN reaction causing severe rash involving mucus membranes or skin necrosis: No Has patient had a PCN reaction that required hospitalization No Has patient had a PCN reaction occurring within the last 10 years: No If all of the above answers are "NO", then may proceed with Cephalosporin use.     Medications:  I have reviewed the patient's current medications. Prior to Admission:  Prescriptions Prior to Admission  Medication Sig Dispense  Refill Last Dose  . acetaminophen (TYLENOL) 500 MG tablet Take 500 mg by mouth every 8 (eight) hours as needed for mild pain.    prn at prn  . carvedilol (COREG) 12.5 MG tablet Take 12.5 mg by mouth 2 (two) times daily with a meal.   08/22/2016 at 0930  . gabapentin (NEURONTIN) 600 MG tablet 600 mg 3 (three) times daily.    08/22/2016 at Unknown time  . losartan-hydrochlorothiazide (HYZAAR) 100-25 MG tablet Take 1 tablet by mouth daily.    08/22/2016 at Unknown time  . omeprazole (PRILOSEC) 40 MG capsule Take 40 mg by mouth daily.    08/22/2016 at Unknown time  . oxyCODONE-acetaminophen (ROXICET) 5-325 MG tablet Take 1 tablet by mouth every 6 (six) hours as needed. 20 tablet 0 prn at prn  . traZODone (DESYREL)  50 MG tablet Take 100 mg by mouth at bedtime.    08/23/2016 at Unknown time   Scheduled: . enoxaparin (LOVENOX) injection  40 mg Subcutaneous Q24H  . gabapentin  600 mg Oral TID  . insulin aspart  0-5 Units Subcutaneous QHS  . insulin aspart  0-9 Units Subcutaneous TID WC  . pantoprazole  40 mg Oral Daily  . [START ON 08/24/2016] pneumococcal 23 valent vaccine  0.5 mL Intramuscular Tomorrow-1000  . sodium chloride flush  3 mL Intravenous Q12H  . traZODone  100 mg Oral QHS    ROS: History obtained from the patient  General ROS: negative for - chills, fatigue, fever, night sweats, weight gain or weight loss Psychological ROS: negative for - behavioral disorder, hallucinations, memory difficulties, mood swings or suicidal ideation Ophthalmic ROS: negative for - blurry vision, double vision, eye pain or loss of vision ENT ROS: negative for - epistaxis, nasal discharge, oral lesions, sore throat, tinnitus or vertigo Allergy and Immunology ROS: negative for - hives or itchy/watery eyes Hematological and Lymphatic ROS: negative for - bleeding problems, bruising or swollen lymph nodes Endocrine ROS: negative for - galactorrhea, hair pattern changes, polydipsia/polyuria or temperature intolerance Respiratory ROS: negative for - cough, hemoptysis, shortness of breath or wheezing Cardiovascular ROS: negative for - chest pain, dyspnea on exertion, edema or irregular heartbeat Gastrointestinal ROS: negative for - abdominal pain, diarrhea, hematemesis, nausea/vomiting or stool incontinence Genito-Urinary ROS: negative for - dysuria, hematuria, incontinence or urinary frequency/urgency Musculoskeletal ROS: negative for - joint swelling or muscular weakness Neurological ROS: as noted in HPI Dermatological ROS: negative for rash and skin lesion changes  Physical Examination: Blood pressure (!) 120/53, pulse 60, temperature 98.8 F (37.1 C), temperature source Oral, resp. rate 16, height 4\' 11"  (1.499  m), weight 66.2 kg (146 lb), SpO2 99 %.  HEENT-  Normocephalic, no lesions, without obvious abnormality.  Normal external eye and conjunctiva.  Normal TM's bilaterally.  Normal auditory canals and external ears. Normal external nose, mucus membranes and septum.  Normal pharynx. Cardiovascular- S1, S2 normal, pulses palpable throughout   Lungs- chest clear, no wheezing, rales, normal symmetric air entry Abdomen- soft, non-tender; bowel sounds normal; no masses,  no organomegaly Extremities- no edema Lymph-no adenopathy palpable Musculoskeletal-no joint tenderness, deformity or swelling Skin-warm and dry, no hyperpigmentation, vitiligo, or suspicious lesions  Neurological Examination Mental Status: Alert, oriented, thought content appropriate.  Speech fluent without evidence of aphasia.  Able to follow 3 step commands without difficulty. Cranial Nerves: II: Discs flat bilaterally; Visual fields grossly normal, pupils equal, round, reactive to light and accommodation III,IV, VI: ptosis not present, extra-ocular motions intact bilaterally V,VII: smile symmetric, facial  light touch sensation normal bilaterally VIII: hearing normal bilaterally IX,X: gag reflex present XI: bilateral shoulder shrug XII: midline tongue extension Motor: Right : Upper extremity   5/5    Left:     Upper extremity   5-/5, with 4/5 hand grip  Lower extremity   5/5     Lower extremity   5/5 Tone and bulk:normal tone throughout; no atrophy noted Sensory: Pinprick and light touch decreased in the last three fingers on the left hand Deep Tendon Reflexes: 2+ and symmetric with absent AJ's bilaterally Plantars: Right: mute   Left: mute Cerebellar: Normal finger-to-nose and normal heel-to-shin testing bilaterally Gait: not tested due to safety concerns      Laboratory Studies:   Basic Metabolic Panel:  Recent Labs Lab 08/22/16 2022 08/23/16 0837  NA 139 138  K 4.0 3.8  CL 104 104  CO2 27 24  GLUCOSE 103*  177*  BUN 13 22*  CREATININE 0.67 0.74  CALCIUM 8.7* 8.3*    Liver Function Tests:  Recent Labs Lab 08/22/16 2022  AST 31  ALT 27  ALKPHOS 97  BILITOT 0.6  PROT 6.6  ALBUMIN 3.8   No results for input(s): LIPASE, AMYLASE in the last 168 hours. No results for input(s): AMMONIA in the last 168 hours.  CBC:  Recent Labs Lab 08/22/16 2022 08/23/16 0837  WBC 8.0 10.4  NEUTROABS 5.4  --   HGB 12.8 11.9*  HCT 37.5 34.9*  MCV 89.9 89.3  PLT 254 219    Cardiac Enzymes:  Recent Labs Lab 08/22/16 2022 08/23/16 0305 08/23/16 0837  TROPONINI <0.03 <0.03 <0.03    BNP: Invalid input(s): POCBNP  CBG:  Recent Labs Lab 08/22/16 2030 08/23/16 0226 08/23/16 0730 08/23/16 1126  GLUCAP 103* 164* 129* 165*    Microbiology: Results for orders placed or performed during the hospital encounter of 01/30/16  C difficile quick scan w PCR reflex     Status: None   Collection Time: 01/29/16  2:30 PM  Result Value Ref Range Status   C Diff antigen NEGATIVE NEGATIVE Final   C Diff toxin NEGATIVE NEGATIVE Final   C Diff interpretation Negative for C. difficile  Final  Gastrointestinal Panel by PCR , Stool     Status: None   Collection Time: 01/29/16  2:30 PM  Result Value Ref Range Status   Campylobacter species NOT DETECTED NOT DETECTED Final   Plesimonas shigelloides NOT DETECTED NOT DETECTED Final   Salmonella species NOT DETECTED NOT DETECTED Final   Yersinia enterocolitica NOT DETECTED NOT DETECTED Final   Vibrio species NOT DETECTED NOT DETECTED Final   Vibrio cholerae NOT DETECTED NOT DETECTED Final   Enteroaggregative E coli (EAEC) NOT DETECTED NOT DETECTED Final   Enteropathogenic E coli (EPEC) NOT DETECTED NOT DETECTED Final   Enterotoxigenic E coli (ETEC) NOT DETECTED NOT DETECTED Final   Shiga like toxin producing E coli (STEC) NOT DETECTED NOT DETECTED Final   E. coli O157 NOT DETECTED NOT DETECTED Final   Shigella/Enteroinvasive E coli (EIEC) NOT DETECTED  NOT DETECTED Final   Cryptosporidium NOT DETECTED NOT DETECTED Final   Cyclospora cayetanensis NOT DETECTED NOT DETECTED Final   Entamoeba histolytica NOT DETECTED NOT DETECTED Final   Giardia lamblia NOT DETECTED NOT DETECTED Final   Adenovirus F40/41 NOT DETECTED NOT DETECTED Final   Astrovirus NOT DETECTED NOT DETECTED Final   Norovirus GI/GII NOT DETECTED NOT DETECTED Final   Rotavirus A NOT DETECTED NOT DETECTED Final   Sapovirus (I,  II, IV, and V) NOT DETECTED NOT DETECTED Final    Coagulation Studies:  Recent Labs  08/22/16 2022  LABPROT 11.7  INR 0.86    Urinalysis:  Recent Labs Lab 08/22/16 2022  COLORURINE STRAW*  LABSPEC 1.004*  PHURINE 6.0  GLUCOSEU NEGATIVE  HGBUR SMALL*  BILIRUBINUR NEGATIVE  KETONESUR NEGATIVE  PROTEINUR NEGATIVE  NITRITE NEGATIVE  LEUKOCYTESUR NEGATIVE    Lipid Panel:     Component Value Date/Time   CHOL 139 08/23/2016 0305   TRIG 64 08/23/2016 0305   HDL 45 08/23/2016 0305   CHOLHDL 3.1 08/23/2016 0305   VLDL 13 08/23/2016 0305   LDLCALC 81 08/23/2016 0305    HgbA1C: No results found for: HGBA1C  Urine Drug Screen:  No results found for: LABOPIA, COCAINSCRNUR, LABBENZ, AMPHETMU, THCU, LABBARB  Alcohol Level: No results for input(s): ETH in the last 168 hours.  Other results: EKG: junctional rhythm, 56 bpm.  Imaging: Dg Chest 1 View  Result Date: 08/22/2016 CLINICAL DATA:  Retrosternal chest pain EXAM: CHEST 1 VIEW COMPARISON:  07/04/2016 FINDINGS: Stable mild cardiomegaly without overt failure. No acute infiltrate or effusion. Atherosclerosis. No pneumothorax. IMPRESSION: Stable cardiomegaly without overt failure. Electronically Signed   By: Donavan Foil M.D.   On: 08/22/2016 21:00   Mr Brain Wo Contrast  Result Date: 08/23/2016 CLINICAL DATA:  Left upper extremity numbness and weakness. EXAM: MRI HEAD WITHOUT CONTRAST MRA HEAD WITHOUT CONTRAST TECHNIQUE: Multiplanar, multiecho pulse sequences of the brain and  surrounding structures were obtained without intravenous contrast. Angiographic images of the head were obtained using MRA technique without contrast. COMPARISON:  None. FINDINGS: MRI HEAD FINDINGS Brain: Diffusion-weighted images demonstrate no acute or subacute infarction. Scattered subcortical T2 hyperintensities are greater than expected for age. There is no significant atrophy. The ventricles are of normal size. Brainstem and cerebellum is normal. The internal auditory canals are within normal limits bilaterally. Vascular: Flow is present in the major intracranial arteries. Skull and upper cervical spine: The skullbase is normal. The craniocervical junction is normal. Marrow signal is within normal limits. Slight anterolisthesis is present at C3-4 and C4-5. Midline sagittal structures are otherwise within normal limits. Sinuses/Orbits: Mild mucosal thickening is present along the floor of the maxillary sinuses bilaterally. Paranasal sinuses and mastoid air cells are otherwise clear. The globes and orbits are within normal limits. MRA HEAD FINDINGS The internal carotid arteries are within normal limits from the high cervical segments through the ICA termini bilaterally. The A1 and M1 segments are normal. The MCA bifurcations are intact. ACA and MCA branch vessels are within normal limits. The left vertebral artery is the dominant vessel. The right vertebral artery is not visualized. It appears hypoplastic on the T2 weighted images. Basilar artery is normal. Both posterior cerebral arteries originate from basilar tip. The PCA branch vessels are within normal limits. IMPRESSION: 1. No acute intracranial abnormality. 2. Moderate diffuse subcortical T2 hyperintensities bilaterally are greater than expected for age. The finding is nonspecific but can be seen in the setting of chronic microvascular ischemia, a demyelinating process such as multiple sclerosis, vasculitis, complicated migraine headaches, or as the  sequelae of a prior infectious or inflammatory process. 3. Minimal maxillary sinus disease. 4. Normal variant MRA circle of Willis without significant proximal stenosis, aneurysm, or branch vessel occlusion. Electronically Signed   By: San Morelle M.D.   On: 08/23/2016 11:00   US Carotid Bilateral (at [facility] And [facility] Only)  Result Date: 08/23/2016 CLINICAL DATA:  Intermittent weakness involving the left  upper extremity. EXAM: BILATERAL CAROTID DUPLEX ULTRASOUND TECHNIQUE: Pearline Cables scale imaging, color Doppler and duplex ultrasound were performed of bilateral carotid and vertebral arteries in the neck. COMPARISON:  None. FINDINGS: Criteria: Quantification of carotid stenosis is based on velocity parameters that correlate the residual internal carotid diameter with NASCET-based stenosis levels, using the diameter of the distal internal carotid lumen as the denominator for stenosis measurement. The following velocity measurements were obtained: RIGHT ICA:  118/18 cm/sec CCA:  [id number] cm/sec SYSTOLIC ICA/CCA RATIO:  0.9 DIASTOLIC ICA/CCA RATIO:  1.0 ECA:  173 cm/sec LEFT ICA:  135/12 cm/sec CCA:  [id number] cm/sec SYSTOLIC ICA/CCA RATIO:  1.2 DIASTOLIC ICA/CCA RATIO:  0.6 ECA:  145 cm/sec RIGHT CAROTID ARTERY: There is a minimal amount of eccentric mixed echogenic plaque within the right carotid bulb or image 16), extending to involve the origin and proximal aspect the right internal carotid artery (image 24), not resulting in elevated peak systolic velocities within the interrogated course the right internal carotid artery to suggest a hemodynamically significant stenosis. RIGHT VERTEBRAL ARTERY:  Antegrade Flow LEFT CAROTID ARTERY: There is a moderate to large amount of eccentric mixed echogenic plaque within the left carotid bulb (image 50), extending to involve the origin and proximal aspect the left internal carotid artery or 59), resulting in elevated peak systolic velocities within the proximal aspect the left  internal carotid artery (greatest acquired peak systolic velocity with the proximal ICA measures 135 cm/sec - image 60), not resulting in elevated peak systolic velocities within the interrogated course of the left internal carotid artery to suggest a hemodynamically significant stenosis. LEFT VERTEBRAL ARTERY:  Antegrade Flow IMPRESSION: 1. Moderate to large amount of left-sided atherosclerotic plaque results in elevated peak systolic velocities within the left internal carotid artery compatible with the lower end of the 50-69% luminal narrowing range. Further evaluation with CTA could performed as clinically indicated. 2. Minimal amount of right-sided atherosclerotic plaque, not resulting in a hemodynamically significant stenosis. Electronically Signed   By: Sandi Mariscal M.D.   On: 08/23/2016 09:32   Mr Jodene Nam Headm  Result Date: 08/23/2016 CLINICAL DATA:  Left upper extremity numbness and weakness. EXAM: MRI HEAD WITHOUT CONTRAST MRA HEAD WITHOUT CONTRAST TECHNIQUE: Multiplanar, multiecho pulse sequences of the brain and surrounding structures were obtained without intravenous contrast. Angiographic images of the head were obtained using MRA technique without contrast. COMPARISON:  None. FINDINGS: MRI HEAD FINDINGS Brain: Diffusion-weighted images demonstrate no acute or subacute infarction. Scattered subcortical T2 hyperintensities are greater than expected for age. There is no significant atrophy. The ventricles are of normal size. Brainstem and cerebellum is normal. The internal auditory canals are within normal limits bilaterally. Vascular: Flow is present in the major intracranial arteries. Skull and upper cervical spine: The skullbase is normal. The craniocervical junction is normal. Marrow signal is within normal limits. Slight anterolisthesis is present at C3-4 and C4-5. Midline sagittal structures are otherwise within normal limits. Sinuses/Orbits: Mild mucosal thickening is present along the floor of  the maxillary sinuses bilaterally. Paranasal sinuses and mastoid air cells are otherwise clear. The globes and orbits are within normal limits. MRA HEAD FINDINGS The internal carotid arteries are within normal limits from the high cervical segments through the ICA termini bilaterally. The A1 and M1 segments are normal. The MCA bifurcations are intact. ACA and MCA branch vessels are within normal limits. The left vertebral artery is the dominant vessel. The right vertebral artery is not visualized. It appears hypoplastic on the T2 weighted images. Basilar artery  is normal. Both posterior cerebral arteries originate from basilar tip. The PCA branch vessels are within normal limits. IMPRESSION: 1. No acute intracranial abnormality. 2. Moderate diffuse subcortical T2 hyperintensities bilaterally are greater than expected for age. The finding is nonspecific but can be seen in the setting of chronic microvascular ischemia, a demyelinating process such as multiple sclerosis, vasculitis, complicated migraine headaches, or as the sequelae of a prior infectious or inflammatory process. 3. Minimal maxillary sinus disease. 4. Normal variant MRA circle of Willis without significant proximal stenosis, aneurysm, or branch vessel occlusion. Electronically Signed   By: San Morelle M.D.   On: 08/23/2016 11:00   Ct Angio Chest Aorta W And/or Wo Contrast  Result Date: 08/22/2016 CLINICAL DATA:  Retrosternal chest pain. EXAM: CT ANGIOGRAPHY CHEST, ABDOMEN AND PELVIS TECHNIQUE: Multidetector CT imaging through the chest, abdomen and pelvis was performed using the standard protocol during bolus administration of intravenous contrast. Multiplanar reconstructed images and MIPs were obtained and reviewed to evaluate the vascular anatomy. CONTRAST:  100 mL Isovue 370 IV COMPARISON:  None. FINDINGS: CTA CHEST FINDINGS Cardiovascular: Precontrast imaging shows no intramural hematoma. Aortic caliber is normal. There is extensive  atherosclerotic calcification of the thoracic aorta and the proximal arch vessels. There is approximately 50% narrowing of the proximal left subclavian artery. The heart size is normal. No pericardial effusion. There are coronary artery calcifications. There is a normal 3 vessel aortic arch branch pattern. There is no dissection or ulceration. Mediastinum/Nodes: No mediastinal, hilar or axillary lymphadenopathy. The visualized thyroid and thoracic esophageal course are unremarkable. Lungs/Pleura: Lungs are clear. No pleural effusion or pneumothorax. Musculoskeletal: No chest wall abnormality. No acute or significant osseous findings. Review of the MIP images confirms the above findings. CTA ABDOMEN AND PELVIS FINDINGS VASCULAR Aorta: There is atherosclerotic calcification the thoracoabdominal aorta but no stenosis, dissection or aneurysm. Celiac: There is calcification of the celiac axis origin without hemodynamically significant stenosis. SMA: There is mild calcification of the SMA origin without hemodynamically significant stenosis. Otherwise normal. Renals: Both renal arteries are patent without evidence of aneurysm, dissection, vasculitis, fibromuscular dysplasia or significant stenosis. There is bilateral origin calcification without stenosis. IMA: Patent without evidence of aneurysm, dissection, vasculitis or significant stenosis. Inflow: Patent without evidence of aneurysm, dissection, vasculitis or significant stenosis. Review of the MIP images confirms the above findings. NON-VASCULAR Hepatobiliary: There is a 1.4 cm hyperenhancing lesion within the right hepatic lobe. Liver is otherwise normal. Status post cholecystectomy. Pancreas: Pancreatic contours are normal. No abnormal calcifications or mass lesions. No peripancreatic fluid collection. No pancreatic ductal dilatation. Spleen: Normal. Adrenals/Urinary Tract: Normal adrenal glands. No hydronephrosis or solid renal mass. Stomach/Bowel: There is sigmoid  diverticulosis without acute inflammation. The remainder of the colon is normal. The appendix is normal. No small bowel dilatation or inflammation. No abdominal fluid collection. Lymphatic: No abdominal, pelvic or mesenteric lymphadenopathy. Reproductive: Status post hysterectomy. No adnexal mass. No free fluid in the pelvis Other: No abdominal wall hernia or abnormality. No abdominopelvic ascites. Musculoskeletal: Multilevel lumbar osteophytosis and facet arthrosis. There is a focal lucency at the base of the twelfth rib without aggressive features. Review of the MIP images confirms the above findings. IMPRESSION: 1. No acute aortic syndrome. 2. No acute abnormality of the chest, abdomen or pelvis. 3. Enhancing lesion within the right hepatic lobe, possibly a flash filling hemangioma, but this is incompletely evaluated in the absence of delayed phase images. Nonemergent outpatient ultrasound could be performed for further characterization. 4. Sigmoid diverticulosis. 5. Aortic atherosclerosis.  Electronically Signed   By: Ulyses Jarred M.D.   On: 08/22/2016 22:33   Ct Head Code Stroke W/o Cm  Result Date: 08/22/2016 CLINICAL DATA:  Code stroke. Right upper extremity numbness, chest pain, and syncope. EXAM: CT HEAD WITHOUT CONTRAST TECHNIQUE: Contiguous axial images were obtained from the base of the skull through the vertex without intravenous contrast. COMPARISON:  02/02/2006 CT head. FINDINGS: Brain: No evidence of acute infarction, hemorrhage, hydrocephalus, extra-axial collection or mass lesion/mass effect. Few foci of hypoattenuation in subcortical white matter compatible with mild chronic microvascular ischemic changes. Vascular: No hyperdense vessel. Mild calcific atherosclerosis of cavernous internal carotid arteries. Skull: Chronic left lower orbit fracture with mesh repair. Calvarium is unremarkable. Sinuses/Orbits: No acute finding. Other: Choose ASPECTS ([place] Stroke Program Early CT Score) -  Ganglionic level infarction (caudate, lentiform nuclei, internal capsule, insula, M1-M3 cortex): 7 - Supraganglionic infarction (M4-M6 cortex): 3 Total score (0-10 with 10 being normal): 10 IMPRESSION: 1. No acute intracranial abnormality identified. If symptoms persist or if clinically indicated MRI is more sensitive for acute stroke. 2. ASPECTS is 10 These results were called by telephone at the time of interpretation on 08/22/2016 at 8:17 pm to Dr. Conni Slipper , who verbally acknowledged these results. Electronically Signed   By: Kristine Garbe M.D.   On: 08/22/2016 20:17   Ct Angio Abd/pel W And/or Wo Contrast  Result Date: 08/22/2016 CLINICAL DATA:  Retrosternal chest pain. EXAM: CT ANGIOGRAPHY CHEST, ABDOMEN AND PELVIS TECHNIQUE: Multidetector CT imaging through the chest, abdomen and pelvis was performed using the standard protocol during bolus administration of intravenous contrast. Multiplanar reconstructed images and MIPs were obtained and reviewed to evaluate the vascular anatomy. CONTRAST:  100 mL Isovue 370 IV COMPARISON:  None. FINDINGS: CTA CHEST FINDINGS Cardiovascular: Precontrast imaging shows no intramural hematoma. Aortic caliber is normal. There is extensive atherosclerotic calcification of the thoracic aorta and the proximal arch vessels. There is approximately 50% narrowing of the proximal left subclavian artery. The heart size is normal. No pericardial effusion. There are coronary artery calcifications. There is a normal 3 vessel aortic arch branch pattern. There is no dissection or ulceration. Mediastinum/Nodes: No mediastinal, hilar or axillary lymphadenopathy. The visualized thyroid and thoracic esophageal course are unremarkable. Lungs/Pleura: Lungs are clear. No pleural effusion or pneumothorax. Musculoskeletal: No chest wall abnormality. No acute or significant osseous findings. Review of the MIP images confirms the above findings. CTA ABDOMEN AND PELVIS FINDINGS VASCULAR  Aorta: There is atherosclerotic calcification the thoracoabdominal aorta but no stenosis, dissection or aneurysm. Celiac: There is calcification of the celiac axis origin without hemodynamically significant stenosis. SMA: There is mild calcification of the SMA origin without hemodynamically significant stenosis. Otherwise normal. Renals: Both renal arteries are patent without evidence of aneurysm, dissection, vasculitis, fibromuscular dysplasia or significant stenosis. There is bilateral origin calcification without stenosis. IMA: Patent without evidence of aneurysm, dissection, vasculitis or significant stenosis. Inflow: Patent without evidence of aneurysm, dissection, vasculitis or significant stenosis. Review of the MIP images confirms the above findings. NON-VASCULAR Hepatobiliary: There is a 1.4 cm hyperenhancing lesion within the right hepatic lobe. Liver is otherwise normal. Status post cholecystectomy. Pancreas: Pancreatic contours are normal. No abnormal calcifications or mass lesions. No peripancreatic fluid collection. No pancreatic ductal dilatation. Spleen: Normal. Adrenals/Urinary Tract: Normal adrenal glands. No hydronephrosis or solid renal mass. Stomach/Bowel: There is sigmoid diverticulosis without acute inflammation. The remainder of the colon is normal. The appendix is normal. No small bowel dilatation or inflammation. No abdominal fluid collection. Lymphatic:  No abdominal, pelvic or mesenteric lymphadenopathy. Reproductive: Status post hysterectomy. No adnexal mass. No free fluid in the pelvis Other: No abdominal wall hernia or abnormality. No abdominopelvic ascites. Musculoskeletal: Multilevel lumbar osteophytosis and facet arthrosis. There is a focal lucency at the base of the twelfth rib without aggressive features. Review of the MIP images confirms the above findings. IMPRESSION: 1. No acute aortic syndrome. 2. No acute abnormality of the chest, abdomen or pelvis. 3. Enhancing lesion within  the right hepatic lobe, possibly a flash filling hemangioma, but this is incompletely evaluated in the absence of delayed phase images. Nonemergent outpatient ultrasound could be performed for further characterization. 4. Sigmoid diverticulosis. 5. Aortic atherosclerosis. Electronically Signed   By: Ulyses Jarred M.D.   On: 08/22/2016 22:33     Assessment/Plan: 76 year old female presenting with chest pain and left upper extremity numbness/weakness.  Symptoms all within distribution of recent shingles.  I suspect that her current LUE findings are sequelae from shingles.  Patient reports she has been dropping things from her left hand.  MRI of the brain reviewed and shows no acute changes.    Recommendations: 1.  Patient to follow up with her neurologist on an outpatient basis with possible NCV/EMG to be performed at that time.   2.  ASA 81mg  daily  Alexis Goodell, MD Neurology [phone] 08/23/2016, 1:19 PM

## 2016-08-23 NOTE — Progress Notes (Signed)
PT Cancellation Note  Patient Details Name: [name] [name] MRN: [id number] DOB: April 26, 1941   Cancelled Treatment:    Reason Eval/Treat Not Completed: Other (comment) (Consult received and chart reviewed.  Per primary RN, patient has been ambulatory within room environment, performing at baseline level.  No PT needs identified at this time.  Will complete initial order; please reconsult should needs change.)   Jaylee Freeze H. Owens Shark, PT, DPT, NCS 08/23/16, 2:35 PM [phone]

## 2016-08-23 NOTE — Care Management Obs Status (Signed)
MEDICARE OBSERVATION STATUS NOTIFICATION   Patient Details  Name: [name] [name] MRN: [id number] Date of Birth: 08/24/1940   Medicare Observation Status Notification Given:  No (discharge order in less than 24 hours)    Ival Bible, RN 08/23/2016, 1:18 PM

## 2016-08-23 NOTE — Progress Notes (Signed)
SLP Cancellation Note  Patient Details Name: [name] [name] MRN: [id number] DOB: August 28, 1940   Cancelled treatment:       Reason Eval/Treat Not Completed: Patient at procedure or test/unavailable   [name] 08/23/2016, 10:25 AM

## 2016-08-23 NOTE — H&P (Signed)
[name] [name] at [place] NAME: [name] [name]    MR#:  [id number]  DATE OF BIRTH:  01/10/1941  DATE OF ADMISSION:  08/22/2016  PRIMARY CARE PHYSICIAN: Kirk Ruths., MD   REQUESTING/REFERRING PHYSICIAN: Clearnce Hasten, MD  CHIEF COMPLAINT:  Chest pain and left upper extremity weakness  HISTORY OF PRESENT ILLNESS:  [name] [name]  is a 76 y.o. female who presents with An episode of left upper extremity numbness and weakness followed by an episode of severe central chest pain and syncope. Chest pain is currently resolved, but left her pressure may numbness and weakness persists. Patient was initially considered for TPA, but with the chest. There was some concern for possible dissection as the cause of all this. She had CTA chest done which did not show any PE or dissection. She is currently outside the window for TPA at this time. Hospitalists were called for admission and further evaluation.  PAST MEDICAL HISTORY:   Past Medical History:  Diagnosis Date  . Anemia   . Arthritis   . Cancer ([place])    melanoma  . Chronic kidney disease    stage 3  . Complication of anesthesia    sometimes hard to wake up  . Diabetes ([place])    diet controlled  . 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 GERD (gastroesophageal reflux disease)   . Headache   . Hyperlipemia   . Hypertension   . Pain    chronic  . Rosacea   . 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 Sleep apnea    cpap    PAST SURGICAL HISTORY:   Past Surgical History:  Procedure Laterality Date  . ABDOMINAL HYSTERECTOMY    . BACK SURGERY     lumbar fusion, laminectomy  . BLEPHAROPLASTY Bilateral 10/24/14   Dr. Loni Muse. Vickki Muff, MBSC  . BREAST SURGERY     reduction  . CHOLECYSTECTOMY    . COLONOSCOPY WITH PROPOFOL N/A 03/17/2016   Procedure: COLONOSCOPY WITH PROPOFOL;  Surgeon: Manya Silvas, MD;  Location: [facility] ENDOSCOPY;  Service: Endoscopy;   Laterality: N/A;  . rcr    . REDUCTION MAMMAPLASTY Bilateral 1985  . SHOULDER ARTHROSCOPY WITH OPEN ROTATOR CUFF REPAIR Right 06/22/2015   Procedure: SHOULDER ARTHROSCOPY WITH SUBACROMIAL DECOMPRESSION, RELEASE OF LONG HEAD OF BICEPS TENDON, RESECTION OF DISTAL CLAVICLE;  Surgeon: Leanor Kail, MD;  Location: [facility];  Service: Orthopedics;  Laterality: Right;  DIABETIC - diet controlled CPAP   . SKIN CANCER EXCISION     leg, back    SOCIAL HISTORY:   Social History  Substance Use Topics  . Smoking status: Never Smoker  . Smokeless tobacco: Never Used  . Alcohol use No    FAMILY HISTORY:   Family History  Problem Relation Age of Onset  . Aneurysm Father   . Hypertension Father   . Breast cancer Neg Hx     DRUG ALLERGIES:   Allergies  Allergen Reactions  . Contrast Media [Iodinated Diagnostic Agents] Other (See Comments)    Made hands and feet "draw up"  . Tramadol Other (See Comments)    Other Reaction: OTHER REACTION, SOB, AGITATIO  . Adhesive [Tape] Rash    Some bandaids  . Penicillins Rash and Other (See Comments)    Has patient had a PCN reaction causing immediate rash, facial/tongue/throat swelling, SOB or lightheadedness with hypotension: Yes Has patient had a PCN  reaction causing severe rash involving mucus membranes or skin necrosis: No Has patient had a PCN reaction that required hospitalization No Has patient had a PCN reaction occurring within the last 10 years: No If all of the above answers are "NO", then may proceed with Cephalosporin use.     MEDICATIONS AT HOME:   Prior to Admission medications   Medication Sig Start Date End Date Taking? Authorizing Provider  acetaminophen (TYLENOL) 500 MG tablet Take 500 mg by mouth every 8 (eight) hours as needed for mild pain.    Yes Historical Provider, MD  carvedilol (COREG) 12.5 MG tablet Take 12.5 mg by mouth 2 (two) times daily with a meal.   Yes Historical Provider, MD  gabapentin  (NEURONTIN) 600 MG tablet 600 mg 3 (three) times daily.  02/10/14  Yes Historical Provider, MD  losartan-hydrochlorothiazide (HYZAAR) 100-25 MG tablet Take 1 tablet by mouth daily.  03/28/09  Yes Historical Provider, MD  omeprazole (PRILOSEC) 40 MG capsule Take 40 mg by mouth daily.  10/11/08  Yes Historical Provider, MD  oxyCODONE-acetaminophen (ROXICET) 5-325 MG tablet Take 1 tablet by mouth every 6 (six) hours as needed. 07/04/16 07/04/17 Yes Nena Polio, MD  traZODone (DESYREL) 50 MG tablet Take 100 mg by mouth at bedtime.    Yes Historical Provider, MD    REVIEW OF SYSTEMS:  Review of Systems  Constitutional: Negative for chills, fever, malaise/fatigue and weight loss.  HENT: Negative for ear pain, hearing loss and tinnitus.   Eyes: Negative for blurred vision, double vision, pain and redness.  Respiratory: Negative for cough, hemoptysis and shortness of breath.   Cardiovascular: Positive for chest pain. Negative for palpitations, orthopnea and leg swelling.  Gastrointestinal: Negative for abdominal pain, constipation, diarrhea, nausea and vomiting.  Genitourinary: Negative for dysuria, frequency and hematuria.  Musculoskeletal: Negative for back pain, joint pain and neck pain.  Skin:       No acne, rash, or lesions  Neurological: Positive for sensory change and focal weakness. Negative for dizziness, tremors and weakness.  Endo/Heme/Allergies: Negative for polydipsia. Does not bruise/bleed easily.  Psychiatric/Behavioral: Negative for depression. The patient is not nervous/anxious and does not have insomnia.      VITAL SIGNS:   Vitals:   08/22/16 2130 08/22/16 2230 08/22/16 2300 08/22/16 2330  BP: (!) 156/74 (!) 168/53 (!) 162/63 (!) 157/65  Pulse: (!) 57 (!) 51 (!) 53 (!) 48  Resp: 11 10 13 11   SpO2: 99% 98% 97% 97%  Weight:      Height:       Wt Readings from Last 3 Encounters:  08/22/16 65.3 kg (144 lb)  07/04/16 68 kg (150 lb)  03/17/16 68 kg (150 lb)    PHYSICAL  EXAMINATION:  Physical Exam  Vitals reviewed. Constitutional: She is oriented to person, place, and time. She appears well-developed and well-nourished. No distress.  HENT:  Head: Normocephalic and atraumatic.  Mouth/Throat: Oropharynx is clear and moist.  Eyes: Conjunctivae and EOM are normal. Pupils are equal, round, and reactive to light. No scleral icterus.  Neck: Normal range of motion. Neck supple. No JVD present. No thyromegaly present.  Cardiovascular: Normal rate, regular rhythm and intact distal pulses.  Exam reveals no gallop and no friction rub.   No murmur heard. Respiratory: Effort normal and breath sounds normal. No respiratory distress. She has no wheezes. She has no rales.  GI: Soft. Bowel sounds are normal. She exhibits no distension. There is no tenderness.  Musculoskeletal: Normal range of motion.  She exhibits no edema.  No arthritis, no gout  Lymphadenopathy:    She has no cervical adenopathy.  Neurological: She is alert and oriented to person, place, and time. No cranial nerve deficit.  Neurologic: Cranial nerves II-XII intact, Sensation intact to light touch/pinprick except for left upper extremity which has decreased sensation to light touch, 5/5 strength in all extremities except for left upper extremity which shows 4/5 strength, no dysarthria, no aphasia, no dysphagia, memory intact, mild pronator drift on left  Skin: Skin is warm and dry. No rash noted. No erythema.  Psychiatric: She has a normal mood and affect. Her behavior is normal. Judgment and thought content normal.    LABORATORY PANEL:   CBC  Recent Labs Lab 08/22/16 2022  WBC 8.0  HGB 12.8  HCT 37.5  PLT 254   ------------------------------------------------------------------------------------------------------------------  Chemistries   Recent Labs Lab 08/22/16 2022  NA 139  K 4.0  CL 104  CO2 27  GLUCOSE 103*  BUN 13  CREATININE 0.67  CALCIUM 8.7*  AST 31  ALT 27  ALKPHOS 97   BILITOT 0.6   ------------------------------------------------------------------------------------------------------------------  Cardiac Enzymes  Recent Labs Lab 08/22/16 2022  TROPONINI <0.03   ------------------------------------------------------------------------------------------------------------------  RADIOLOGY:  Dg Chest 1 View  Result Date: 08/22/2016 CLINICAL DATA:  Retrosternal chest pain EXAM: CHEST 1 VIEW COMPARISON:  07/04/2016 FINDINGS: Stable mild cardiomegaly without overt failure. No acute infiltrate or effusion. Atherosclerosis. No pneumothorax. IMPRESSION: Stable cardiomegaly without overt failure. Electronically Signed   By: Donavan Foil M.D.   On: 08/22/2016 21:00   Ct Angio Chest Aorta W And/or Wo Contrast  Result Date: 08/22/2016 CLINICAL DATA:  Retrosternal chest pain. EXAM: CT ANGIOGRAPHY CHEST, ABDOMEN AND PELVIS TECHNIQUE: Multidetector CT imaging through the chest, abdomen and pelvis was performed using the standard protocol during bolus administration of intravenous contrast. Multiplanar reconstructed images and MIPs were obtained and reviewed to evaluate the vascular anatomy. CONTRAST:  100 mL Isovue 370 IV COMPARISON:  None. FINDINGS: CTA CHEST FINDINGS Cardiovascular: Precontrast imaging shows no intramural hematoma. Aortic caliber is normal. There is extensive atherosclerotic calcification of the thoracic aorta and the proximal arch vessels. There is approximately 50% narrowing of the proximal left subclavian artery. The heart size is normal. No pericardial effusion. There are coronary artery calcifications. There is a normal 3 vessel aortic arch branch pattern. There is no dissection or ulceration. Mediastinum/Nodes: No mediastinal, hilar or axillary lymphadenopathy. The visualized thyroid and thoracic esophageal course are unremarkable. Lungs/Pleura: Lungs are clear. No pleural effusion or pneumothorax. Musculoskeletal: No chest wall abnormality. No acute  or significant osseous findings. Review of the MIP images confirms the above findings. CTA ABDOMEN AND PELVIS FINDINGS VASCULAR Aorta: There is atherosclerotic calcification the thoracoabdominal aorta but no stenosis, dissection or aneurysm. Celiac: There is calcification of the celiac axis origin without hemodynamically significant stenosis. SMA: There is mild calcification of the SMA origin without hemodynamically significant stenosis. Otherwise normal. Renals: Both renal arteries are patent without evidence of aneurysm, dissection, vasculitis, fibromuscular dysplasia or significant stenosis. There is bilateral origin calcification without stenosis. IMA: Patent without evidence of aneurysm, dissection, vasculitis or significant stenosis. Inflow: Patent without evidence of aneurysm, dissection, vasculitis or significant stenosis. Review of the MIP images confirms the above findings. NON-VASCULAR Hepatobiliary: There is a 1.4 cm hyperenhancing lesion within the right hepatic lobe. Liver is otherwise normal. Status post cholecystectomy. Pancreas: Pancreatic contours are normal. No abnormal calcifications or mass lesions. No peripancreatic fluid collection. No pancreatic ductal dilatation. Spleen: Normal.  Adrenals/Urinary Tract: Normal adrenal glands. No hydronephrosis or solid renal mass. Stomach/Bowel: There is sigmoid diverticulosis without acute inflammation. The remainder of the colon is normal. The appendix is normal. No small bowel dilatation or inflammation. No abdominal fluid collection. Lymphatic: No abdominal, pelvic or mesenteric lymphadenopathy. Reproductive: Status post hysterectomy. No adnexal mass. No free fluid in the pelvis Other: No abdominal wall hernia or abnormality. No abdominopelvic ascites. Musculoskeletal: Multilevel lumbar osteophytosis and facet arthrosis. There is a focal lucency at the base of the twelfth rib without aggressive features. Review of the MIP images confirms the above  findings. IMPRESSION: 1. No acute aortic syndrome. 2. No acute abnormality of the chest, abdomen or pelvis. 3. Enhancing lesion within the right hepatic lobe, possibly a flash filling hemangioma, but this is incompletely evaluated in the absence of delayed phase images. Nonemergent outpatient ultrasound could be performed for further characterization. 4. Sigmoid diverticulosis. 5. Aortic atherosclerosis. Electronically Signed   By: Ulyses Jarred M.D.   On: 08/22/2016 22:33   Ct Head Code Stroke W/o Cm  Result Date: 08/22/2016 CLINICAL DATA:  Code stroke. Right upper extremity numbness, chest pain, and syncope. EXAM: CT HEAD WITHOUT CONTRAST TECHNIQUE: Contiguous axial images were obtained from the base of the skull through the vertex without intravenous contrast. COMPARISON:  02/02/2006 CT head. FINDINGS: Brain: No evidence of acute infarction, hemorrhage, hydrocephalus, extra-axial collection or mass lesion/mass effect. Few foci of hypoattenuation in subcortical white matter compatible with mild chronic microvascular ischemic changes. Vascular: No hyperdense vessel. Mild calcific atherosclerosis of cavernous internal carotid arteries. Skull: Chronic left lower orbit fracture with mesh repair. Calvarium is unremarkable. Sinuses/Orbits: No acute finding. Other: Choose ASPECTS ([place] Stroke Program Early CT Score) - Ganglionic level infarction (caudate, lentiform nuclei, internal capsule, insula, M1-M3 cortex): 7 - Supraganglionic infarction (M4-M6 cortex): 3 Total score (0-10 with 10 being normal): 10 IMPRESSION: 1. No acute intracranial abnormality identified. If symptoms persist or if clinically indicated MRI is more sensitive for acute stroke. 2. ASPECTS is 10 These results were called by telephone at the time of interpretation on 08/22/2016 at 8:17 pm to Dr. Conni Slipper , who verbally acknowledged these results. Electronically Signed   By: Kristine Garbe M.D.   On: 08/22/2016 20:17   Ct Angio  Abd/pel W And/or Wo Contrast  Result Date: 08/22/2016 CLINICAL DATA:  Retrosternal chest pain. EXAM: CT ANGIOGRAPHY CHEST, ABDOMEN AND PELVIS TECHNIQUE: Multidetector CT imaging through the chest, abdomen and pelvis was performed using the standard protocol during bolus administration of intravenous contrast. Multiplanar reconstructed images and MIPs were obtained and reviewed to evaluate the vascular anatomy. CONTRAST:  100 mL Isovue 370 IV COMPARISON:  None. FINDINGS: CTA CHEST FINDINGS Cardiovascular: Precontrast imaging shows no intramural hematoma. Aortic caliber is normal. There is extensive atherosclerotic calcification of the thoracic aorta and the proximal arch vessels. There is approximately 50% narrowing of the proximal left subclavian artery. The heart size is normal. No pericardial effusion. There are coronary artery calcifications. There is a normal 3 vessel aortic arch branch pattern. There is no dissection or ulceration. Mediastinum/Nodes: No mediastinal, hilar or axillary lymphadenopathy. The visualized thyroid and thoracic esophageal course are unremarkable. Lungs/Pleura: Lungs are clear. No pleural effusion or pneumothorax. Musculoskeletal: No chest wall abnormality. No acute or significant osseous findings. Review of the MIP images confirms the above findings. CTA ABDOMEN AND PELVIS FINDINGS VASCULAR Aorta: There is atherosclerotic calcification the thoracoabdominal aorta but no stenosis, dissection or aneurysm. Celiac: There is calcification of the celiac axis origin  without hemodynamically significant stenosis. SMA: There is mild calcification of the SMA origin without hemodynamically significant stenosis. Otherwise normal. Renals: Both renal arteries are patent without evidence of aneurysm, dissection, vasculitis, fibromuscular dysplasia or significant stenosis. There is bilateral origin calcification without stenosis. IMA: Patent without evidence of aneurysm, dissection, vasculitis or  significant stenosis. Inflow: Patent without evidence of aneurysm, dissection, vasculitis or significant stenosis. Review of the MIP images confirms the above findings. NON-VASCULAR Hepatobiliary: There is a 1.4 cm hyperenhancing lesion within the right hepatic lobe. Liver is otherwise normal. Status post cholecystectomy. Pancreas: Pancreatic contours are normal. No abnormal calcifications or mass lesions. No peripancreatic fluid collection. No pancreatic ductal dilatation. Spleen: Normal. Adrenals/Urinary Tract: Normal adrenal glands. No hydronephrosis or solid renal mass. Stomach/Bowel: There is sigmoid diverticulosis without acute inflammation. The remainder of the colon is normal. The appendix is normal. No small bowel dilatation or inflammation. No abdominal fluid collection. Lymphatic: No abdominal, pelvic or mesenteric lymphadenopathy. Reproductive: Status post hysterectomy. No adnexal mass. No free fluid in the pelvis Other: No abdominal wall hernia or abnormality. No abdominopelvic ascites. Musculoskeletal: Multilevel lumbar osteophytosis and facet arthrosis. There is a focal lucency at the base of the twelfth rib without aggressive features. Review of the MIP images confirms the above findings. IMPRESSION: 1. No acute aortic syndrome. 2. No acute abnormality of the chest, abdomen or pelvis. 3. Enhancing lesion within the right hepatic lobe, possibly a flash filling hemangioma, but this is incompletely evaluated in the absence of delayed phase images. Nonemergent outpatient ultrasound could be performed for further characterization. 4. Sigmoid diverticulosis. 5. Aortic atherosclerosis. Electronically Signed   By: Ulyses Jarred M.D.   On: 08/22/2016 22:33    EKG:   Orders placed or performed during the hospital encounter of 08/22/16  . EKG 12-Lead  . EKG 12-Lead  . ED EKG  . ED EKG    IMPRESSION AND PLAN:  Principal Problem:   Chest pain - patient had an episode of chest pain shortly after her  left upper extremity symptoms started, and that she had a syncope. She is currently chest pain-free. First set of cardiac enzymes in the ED was negative. EKG is largely unremarkable for acute ischemic insult. We will trend her cardiac enzymes tonight, get an echocardiogram in the morning. She has any significant abnormality we can then get a cardiology consult Active Problems:   Weakness of left upper extremity - persistent weakness and numbness of the left upper extremity, CT head showed no abnormality, we will get an MRI as well as other pertinent labs and imaging per her stroke order set and a neurology consult   Diabetes ([place]) - sliding scale insulin with corresponding glucose checks   BP (high blood pressure) - hold home antihypertensives for now and she will be allowed permissive hypertension for the first 24 hours with a blood pressure goal less than 220/120. After 24 hours from onset of symptoms her blood pressure goal will be less than 160/100   Dyslipidemia - continue home meds  All the records are reviewed and case discussed with ED provider. Management plans discussed with the patient and/or family.  DVT PROPHYLAXIS: SubQ lovenox  GI PROPHYLAXIS: None  ADMISSION STATUS: Observation  CODE STATUS: Full Code Status History    This patient does not have a recorded code status. Please follow your organizational policy for patients in this situation.    Advance Directive Documentation   Flowsheet Row Most Recent Value  Type of Advance Directive  Living will  Pre-existing out of facility DNR order (yellow form or pink MOST form)  No data  "MOST" Form in Place?  No data      TOTAL TIME TAKING CARE OF THIS PATIENT: 40 minutes.    Chidera Dearcos [place] 08/23/2016, 12:36 AM  [facility]  Office  [phone]  CC: Primary care physician; Kirk Ruths., MD

## 2016-08-23 NOTE — ED Notes (Signed)
Patient transport to 235

## 2016-08-23 NOTE — Progress Notes (Signed)
Discharge instructions given to patient and husband. Questions answered. Patient was ruled out for a stroke. Education on warning signs given. Patient will make follow up appointments. IV and tele removed. Verbalized understanding.

## 2016-08-23 NOTE — Progress Notes (Signed)
*  PRELIMINARY RESULTS* Echocardiogram 2D Echocardiogram has been performed.  [name] [name] 08/23/2016, 9:41 AM

## 2016-08-24 LAB — HEMOGLOBIN A1C
Hgb A1c MFr Bld: 6.9 % — ABNORMAL HIGH (ref 4.8–5.6)
Mean Plasma Glucose: 151 mg/dL

## 2016-10-27 ENCOUNTER — Other Ambulatory Visit: Payer: Self-pay | Admitting: Internal Medicine

## 2016-10-27 DIAGNOSIS — N6310 Unspecified lump in the right breast, unspecified quadrant: Secondary | ICD-10-CM

## 2016-11-07 IMAGING — MR MRI LUMBAR SPINE WITHOUT CONTRAST
5 series · 35 of 48 positions shown · non-contrast
Comparison: 07/07/2011

CLINICAL DATA: Low back pain extending into the right buttock and
down the right posterior leg.

EXAM:
MRI LUMBAR SPINE WITHOUT CONTRAST
TECHNIQUE: Multiplanar, multisequence MR imaging of the lumbar spine was
performed. No intravenous contrast was administered.

[Series 2: T2 · sagittal · 4.0mm · 0.81mm/px · 6 of 15 slices shown (1 of 2)]
[im 1/15]
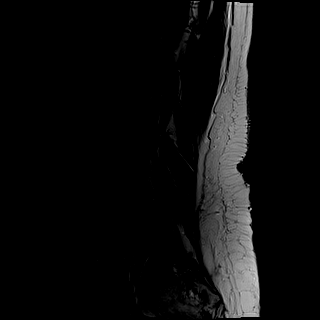
[im 3/15]
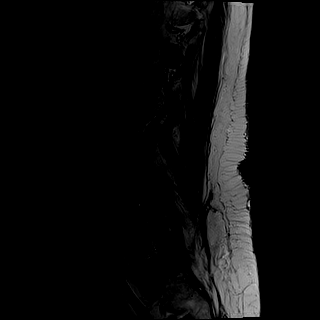
[im 6/15]
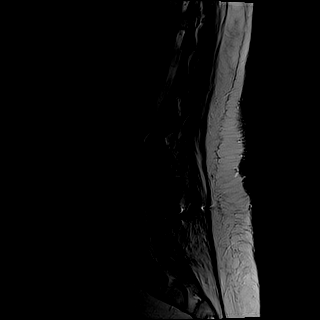
[im 9/15]
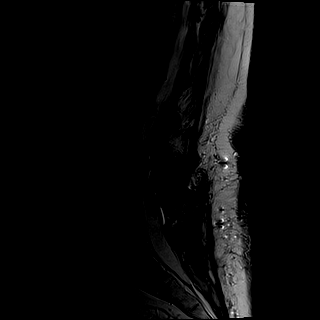
[im 12/15]
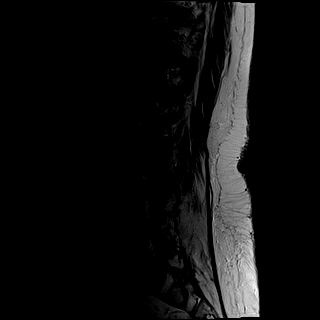
[im 15/15]
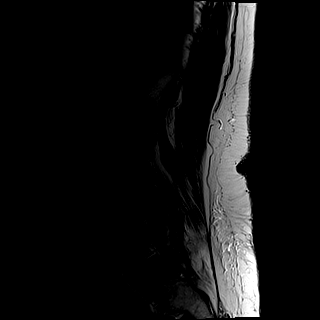

[Series 3: T1 · sagittal · 4.0mm · 0.81mm/px · 6 of 15 slices shown (1 of 2)]
[im 1/15]
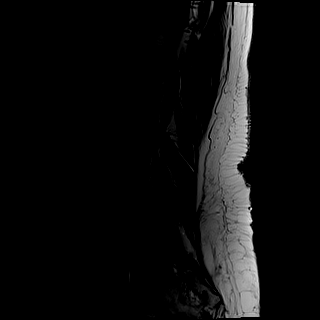
[im 3/15]
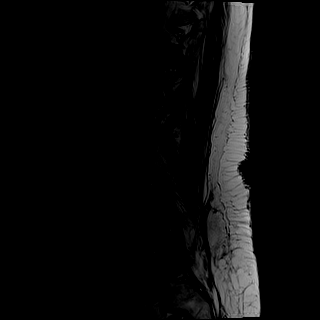
[im 6/15]
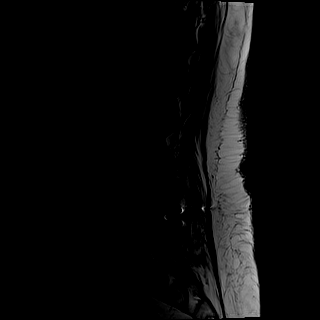
[im 9/15]
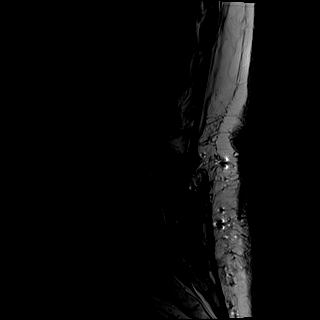
[im 12/15]
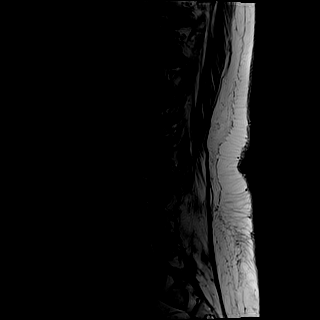
[im 15/15]
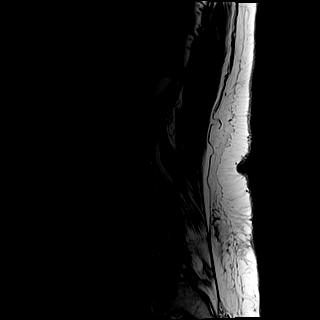

[Series 4: STIR · sagittal · 4.0mm · 1.02mm/px · 5 of 15 slices shown]
[im 1/15]
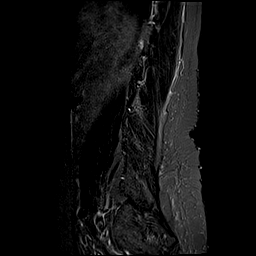
[im 3/15]
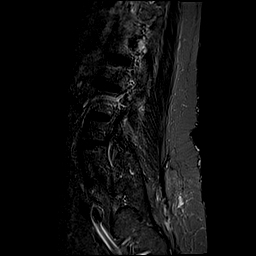
[im 6/15]
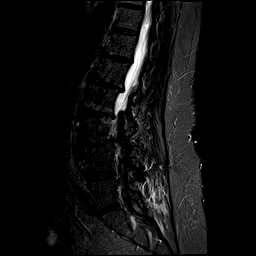
[im 9/15]
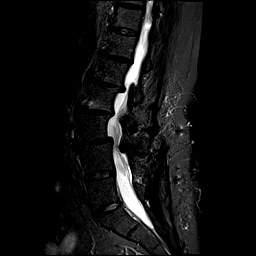
[im 12/15]
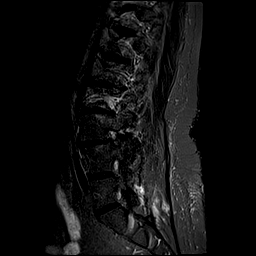

[Series 5: T2 · axial · 4.0mm · 0.78mm/px · z∈[-73,+122]mm · 9 of 36 slices shown (2 of 2)]
[im 1/36]
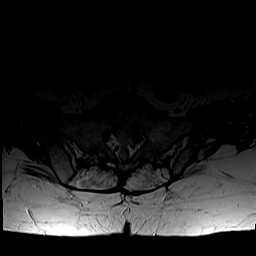
[im 6/36]
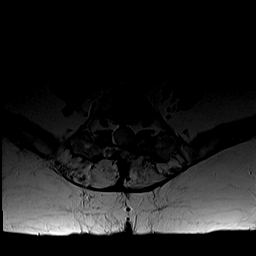
[im 11/36]
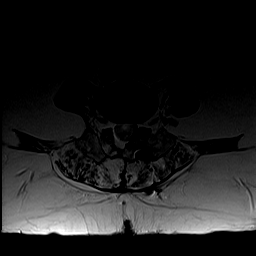
[im 16/36]
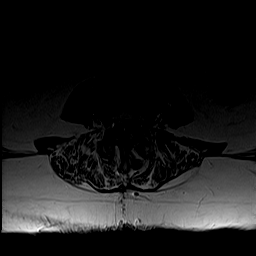
[im 18/36]
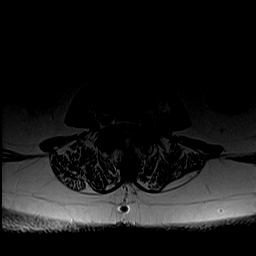
[im 21/36]
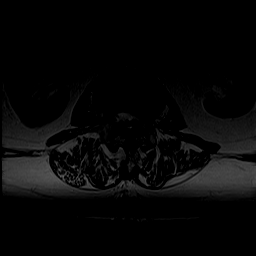
[im 26/36]
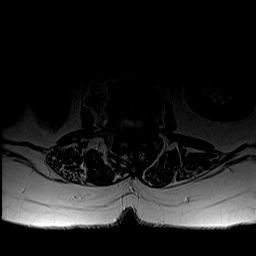
[im 31/36]
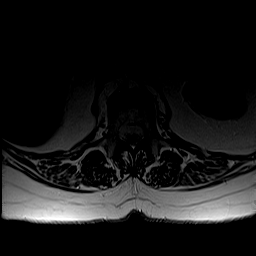
[im 36/36]
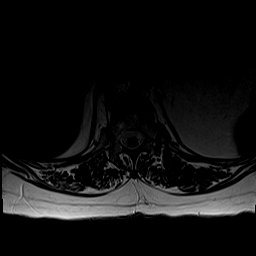

[Series 6: T1 · axial · 4.0mm · 0.39mm/px · z∈[-73,+122]mm · 9 of 36 slices shown (2 of 2)]
[im 1/36]
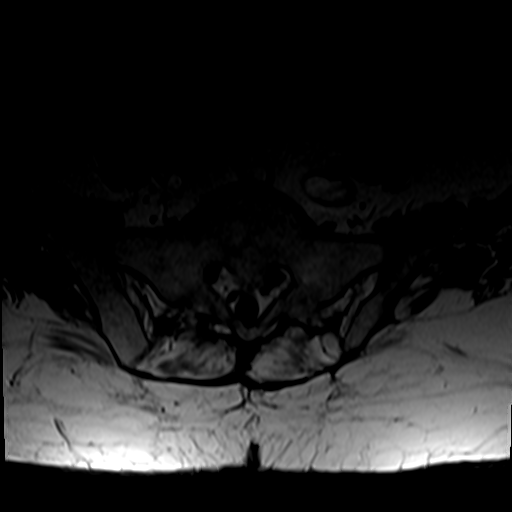
[im 6/36]
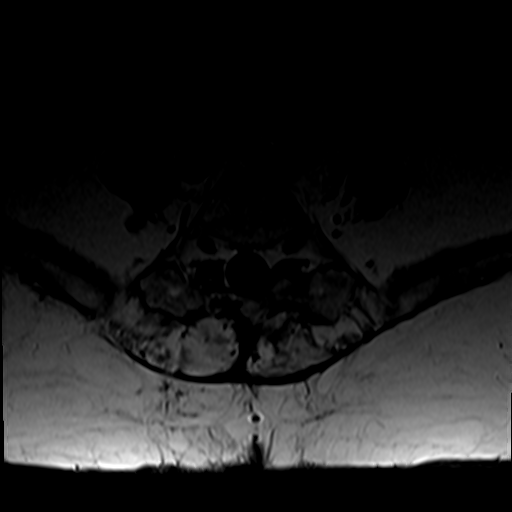
[im 11/36]
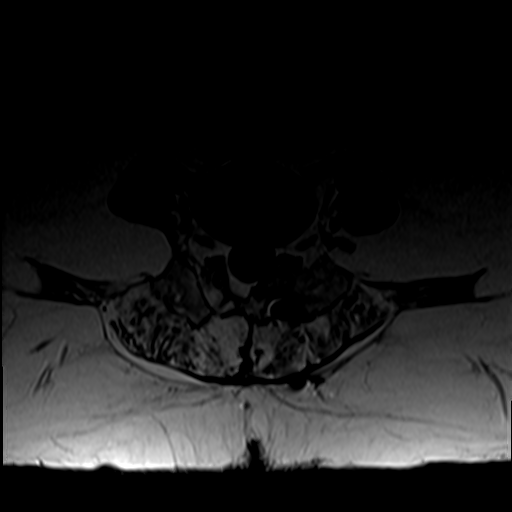
[im 16/36]
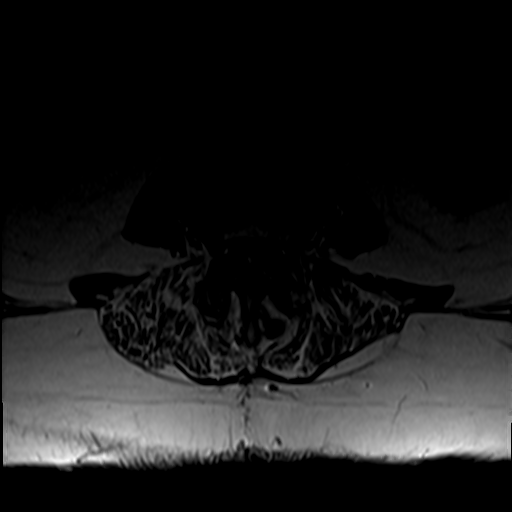
[im 18/36]
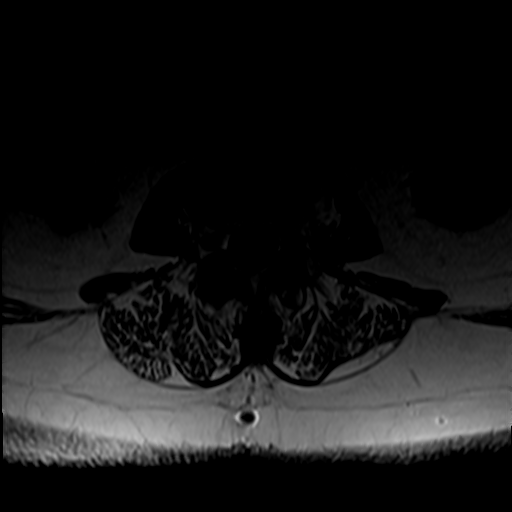
[im 21/36]
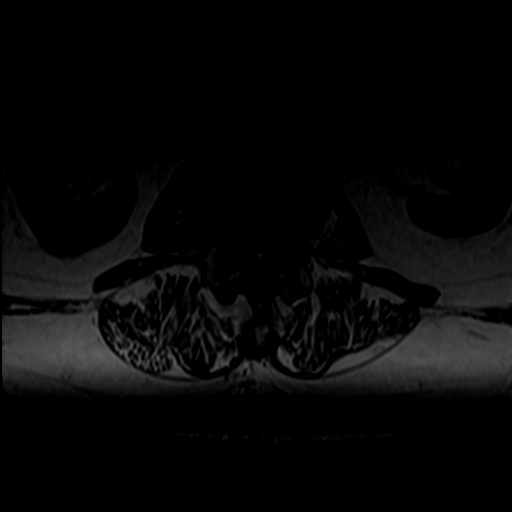
[im 26/36]
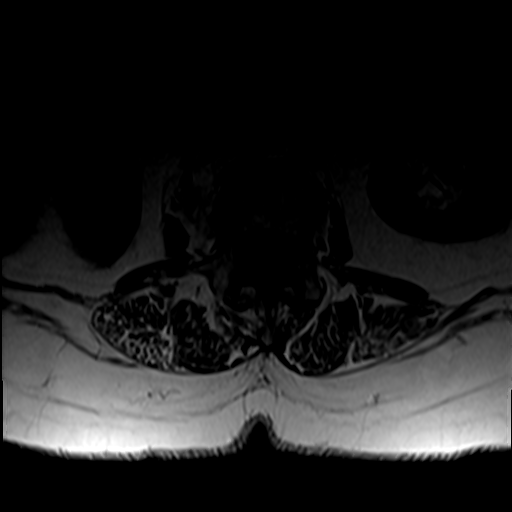
[im 31/36]
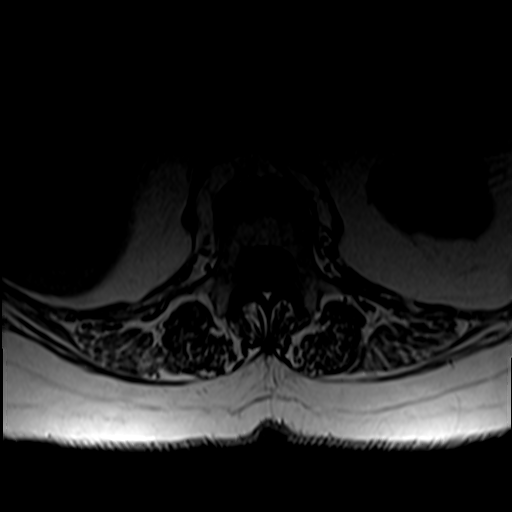
[im 36/36]
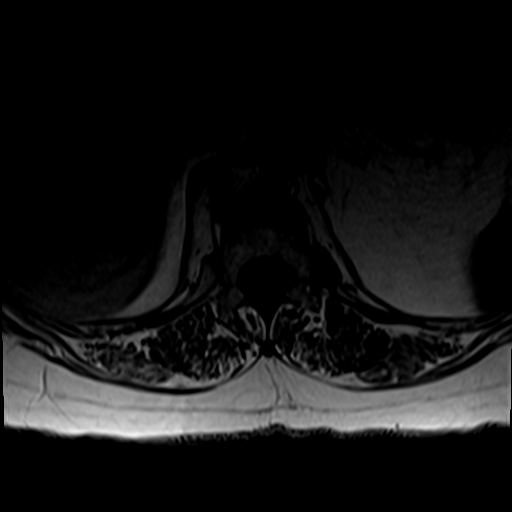

[35 of 48 positions shown; findings below may reference images not displayed]

FINDINGS: The lowest lumbar type non-rib-bearing vertebra is labeled as L5.
The conus medullaris appears normal. Conus level: Mid L2 level,
borderline low.

There is 3 mm degenerative retrolisthesis at L1; 4 mm retrolisthesis
at L2-3; and 6 mm anterolisthesis at L3-4. Type 1 degenerative
endplate findings are present at L1-2 and L2-3. Disc desiccation
noted at all levels between T12 and L4 with associated loss of
intervertebral disc height.

Postoperative facet fusion bilaterally at L4-5 with posterior
decompression. Edematous and apparently degenerated left
costosternal junction at T12.

Paraspinal muscular atrophy in the lower lumbar spine. Additional
findings at individual levels are as follows:

T12-L1:  No impingement.  Mild disc bulge.

L1-2: Mild bilateral foraminal stenosis due to facet arthropathy and
disc osteophyte complex. Mild bilateral subarticular lateral recess
stenosis.

L2-3: Moderate central narrowing of the thecal sac with mild
bilateral foraminal stenosis and mild bilateral subarticular lateral
recess stenosis due to disc bulge, facet arthropathy, and ligamentum
flavum redundancy as well as intervertebral spurring.

L3-4: Prominent central narrowing of the thecal sac with moderate
left foraminal stenosis and moderate to prominent bilateral
subarticular lateral recess stenosis secondary to disc uncovering,
facet arthropathy, and disc bulge. Cross-sectional area of the
thecal sac: 0.35 cm^2.

L4-5:  No impingement.  Postoperative level.

L5-S1:  No impingement.  Prior right laminectomy.
IMPRESSION: 1. Lumbar spondylosis and degenerative disc disease, causing
prominent impingement at L3-4 ; moderate impingement at L2-3; and
mild impingement at L1-2, as detailed above. No impingement at the
postoperative levels in the lower lumbar spine.
2. Borderline low conus without thickened filum a, mass, or other
features of tethering.
3. Edematous left costosternal junction at T12, probably T2
degeneration.

## 2016-11-11 ENCOUNTER — Other Ambulatory Visit: Payer: Medicare Other

## 2016-11-18 ENCOUNTER — Ambulatory Visit
Admission: RE | Admit: 2016-11-18 | Discharge: 2016-11-18 | Disposition: A | Discharge status: Home / Self Care | Payer: Medicare HMO | Source: Ambulatory Visit | Attending: Internal Medicine | Admitting: Internal Medicine

## 2016-11-18 DIAGNOSIS — N6310 Unspecified lump in the right breast, unspecified quadrant: Secondary | ICD-10-CM | POA: Diagnosis not present

## 2016-11-19 ENCOUNTER — Other Ambulatory Visit: Payer: Self-pay | Admitting: Internal Medicine

## 2016-11-19 DIAGNOSIS — N6310 Unspecified lump in the right breast, unspecified quadrant: Secondary | ICD-10-CM

## 2017-03-30 ENCOUNTER — Ambulatory Visit
Admission: RE | Admit: 2017-03-30 | Discharge: 2017-03-30 | Disposition: A | Discharge status: Home / Self Care | Payer: Medicare HMO | Source: Ambulatory Visit | Attending: Internal Medicine | Admitting: Internal Medicine

## 2017-03-30 DIAGNOSIS — N6341 Unspecified lump in right breast, subareolar: Secondary | ICD-10-CM | POA: Diagnosis not present

## 2017-03-30 DIAGNOSIS — N6310 Unspecified lump in the right breast, unspecified quadrant: Secondary | ICD-10-CM | POA: Diagnosis present

## 2017-06-16 DIAGNOSIS — G4700 Insomnia, unspecified: Secondary | ICD-10-CM | POA: Insufficient documentation

## 2017-07-15 DIAGNOSIS — Z87898 Personal history of other specified conditions: Secondary | ICD-10-CM | POA: Insufficient documentation

## 2018-02-24 NOTE — Progress Notes (Signed)
02/25/2018 11:00 AM   [name] [name] 02-22-1941 [id number]  Referring provider: Kirk Ruths, MD [facility] [place], [place] [address]  Chief Complaint  Patient presents with  . Urinary Incontinence    HPI: Patient is a 77 -year-old Caucasian female who is referred to [place] by Dr. Kirk Ruths for urinary incontinence.  She states that she always had issues with her bladder.  As a child, she had incontinent episodes and was placed on "green medicine" that tasted awful.  She was also a bed wetting until the age of 64.  She also had to wear pads as a teenager.    From the age of 7 until the few years, she managed her urge incontinence without accidents.    She states she has noticed a worsening of her urinary symptoms over the last year.   Patient has incontinence with urgency and SUI.  She is experiencing a lot incontinent episodes during the day. She is experiencing occasional incontinent episodes during the night.  Her incontinence volume is large.   She is wearing 7 to 8 pads/depends daily.    She is having associated urinary frequency, urgency, nocturia x 1-2, intermittency and straining to urinate.  Patient denies any gross hematuria, dysuria or suprapubic/flank pain.  Patient denies any fevers, chills, nausea or vomiting.  Her PVR is 0 mL.    She does not have a history of urinary tract infections, STI's or injury to the bladder.   She does not have a history of nephrolithiasis, GU surgery or GU trauma.   She has had several cystoscopies with Dr. Madelin Headings.    She is not sexually active.  She is post menopausal.   She admits to constipation and/or diarrhea.   She is drinking 24 ounces of water daily.   She has a cappuccino daily.  She drinks one bottle of diet Pepsi.  She does not drink juices.  She does not drink tea.  She does not drink alcohol.  She is not a smoker.     She was tried on Vesicare 5 mg daily, but she  states it made her dizzy.      PMH: Past Medical History:  Diagnosis Date  . Anemia   . Arthritis   . Cancer ([place])    melanoma  . Chronic kidney disease    stage 3  . Complication of anesthesia    sometimes hard to wake up  . Diabetes ([place])    diet controlled  . 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 GERD (gastroesophageal reflux disease)   . Headache   . Hyperlipemia   . Hypertension   . Pain    chronic  . Rosacea   . 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 Sleep apnea    cpap    Surgical History: Past Surgical History:  Procedure Laterality Date  . ABDOMINAL HYSTERECTOMY    . BACK SURGERY     lumbar fusion, laminectomy  . BLEPHAROPLASTY Bilateral 10/24/14   Dr. Loni Muse. Vickki Muff, MBSC  . BREAST SURGERY     reduction  . CHOLECYSTECTOMY    . COLONOSCOPY WITH PROPOFOL N/A 03/17/2016   Procedure: COLONOSCOPY WITH PROPOFOL;  Surgeon: Manya Silvas, MD;  Location: [facility] ENDOSCOPY;  Service: Endoscopy;  Laterality: N/A;  . rcr    . REDUCTION MAMMAPLASTY Bilateral 1985  . SHOULDER ARTHROSCOPY WITH OPEN  ROTATOR CUFF REPAIR Right 06/22/2015   Procedure: SHOULDER ARTHROSCOPY WITH SUBACROMIAL DECOMPRESSION, RELEASE OF LONG HEAD OF BICEPS TENDON, RESECTION OF DISTAL CLAVICLE;  Surgeon: Leanor Kail, MD;  Location: [facility];  Service: Orthopedics;  Laterality: Right;  DIABETIC - diet controlled CPAP   . SKIN CANCER EXCISION     leg, back    Home Medications:  Allergies as of 02/25/2018      Reactions   Contrast Media [iodinated Diagnostic Agents] Other (See Comments)   Made hands and feet "draw up"   Tramadol Other (See Comments)   Other Reaction: OTHER REACTION, SOB, AGITATIO   Adhesive [tape] Rash   Some bandaids   Penicillins Rash, Other (See Comments)   Has patient had a PCN reaction causing immediate rash, facial/tongue/throat swelling, SOB or lightheadedness with hypotension: Yes Has patient had a PCN reaction causing  severe rash involving mucus membranes or skin necrosis: No Has patient had a PCN reaction that required hospitalization No Has patient had a PCN reaction occurring within the last 10 years: No If all of the above answers are "NO", then may proceed with Cephalosporin use.      Medication List        Accurate as of 02/25/18 11:00 AM. Always use your most recent med list.          acetaminophen 500 MG tablet Commonly known as:  TYLENOL Take 500 mg by mouth every 8 (eight) hours as needed for mild pain.   aspirin EC 81 MG tablet Take 1 tablet (81 mg total) by mouth daily.   atorvastatin 80 MG tablet Commonly known as:  LIPITOR TAKE 1 TABVLET BY MOUTH EVERY DAY   carvedilol 12.5 MG tablet Commonly known as:  COREG Take 12.5 mg by mouth 2 (two) times daily with a meal.   conjugated estrogens vaginal cream Commonly known as:  PREMARIN Apply 0.5mg  (pea-sized amount)  just inside the vaginal introitus with a finger-tip on  Monday, Wednesday and Friday nights.   doxycycline 100 MG tablet Commonly known as:  VIBRA-TABS TAKE 1 TABLET EVERY DAY TO EVERY OTHER DAY AS DIRECTED   estradiol 0.1 MG/GM vaginal cream Commonly known as:  ESTRACE VAGINAL Apply 0.5mg  (pea-sized amount)  just inside the vaginal introitus with a finger-tip on Monday, Wednesday and Friday nights.   gabapentin 600 MG tablet Commonly known as:  NEURONTIN 600 mg 3 (three) times daily.   HYZAAR 100-25 MG tablet Generic drug:  losartan-hydrochlorothiazide Take 1 tablet by mouth daily.   PRILOSEC 40 MG capsule Generic drug:  omeprazole Take 40 mg by mouth daily.   traZODone 50 MG tablet Commonly known as:  DESYREL Take 100 mg by mouth at bedtime.       Allergies:  Allergies  Allergen Reactions  . Contrast Media [Iodinated Diagnostic Agents] Other (See Comments)    Made hands and feet "draw up"  . Tramadol Other (See Comments)    Other Reaction: OTHER REACTION, SOB, AGITATIO  . Adhesive [Tape] Rash      Some bandaids  . Penicillins Rash and Other (See Comments)    Has patient had a PCN reaction causing immediate rash, facial/tongue/throat swelling, SOB or lightheadedness with hypotension: Yes Has patient had a PCN reaction causing severe rash involving mucus membranes or skin necrosis: No Has patient had a PCN reaction that required hospitalization No Has patient had a PCN reaction occurring within the last 10 years: No If all of the above answers are "NO", then may proceed with  Cephalosporin use.     Family History: Family History  Problem Relation Age of Onset  . Aneurysm Father   . Hypertension Father   . Breast cancer Neg Hx     Social History:  reports that she has never smoked. She has never used smokeless tobacco. She reports that she does not drink alcohol or use drugs.  ROS: UROLOGY Frequent Urination?: Yes Hard to postpone urination?: Yes Burning/pain with urination?: No Get up at night to urinate?: Yes Leakage of urine?: Yes Urine stream starts and stops?: Yes Trouble starting stream?: No Do you have to strain to urinate?: No Blood in urine?: No Urinary tract infection?: No Sexually transmitted disease?: No Injury to kidneys or bladder?: No Painful intercourse?: No Weak stream?: No Currently pregnant?: No Vaginal bleeding?: No  Gastrointestinal Nausea?: No Vomiting?: No Indigestion/heartburn?: No Diarrhea?: No Constipation?: No  Constitutional Fever: No Night sweats?: No Weight loss?: No Fatigue?: Yes  Skin Skin rash/lesions?: No Itching?: No  Eyes Blurred vision?: No Double vision?: No  Ears/Nose/Throat Sore throat?: No Sinus problems?: Yes  Hematologic/Lymphatic Swollen glands?: No Easy bruising?: Yes  Cardiovascular Leg swelling?: No Chest pain?: No  Respiratory Cough?: No Shortness of breath?: No  Endocrine Excessive thirst?: No  Musculoskeletal Back pain?: No Joint pain?: Yes  Neurological Headaches?:  No Dizziness?: No  Psychologic Depression?: No Anxiety?: No  Physical Exam: BP 128/69   Pulse 65   Wt 151 lb (68.5 kg)   BMI 30.50 kg/m   Constitutional: Well nourished. Alert and oriented, No acute distress. HEENT: [place] AT, moist mucus membranes. Trachea midline, no masses. Cardiovascular: No clubbing, cyanosis, or edema. Respiratory: Normal respiratory effort, no increased work of breathing. GI: Abdomen is soft, non tender, non distended, no abdominal masses. Liver and spleen not palpable.  No hernias appreciated.  Stool sample for occult testing is not indicated.   GU: No CVA tenderness.  No bladder fullness or masses.  Atrophic external genitalia, normal pubic hair distribution, no lesions.  Normal urethral meatus, no lesions, no prolapse, no discharge.   No urethral masses, tenderness and/or tenderness. No bladder fullness, tenderness or masses. Pale vagina mucosa,  poor estrogen effect, no discharge, no lesions, good pelvic support, Grade II cystocele noted.  Rectocele noted.  Cervix, uterus and adnexa.  Anus and perineum are without rashes or lesions.    Skin: No rashes, bruises or suspicious lesions. Lymph: No cervical or inguinal adenopathy. Neurologic: Grossly intact, no focal deficits, moving all 4 extremities. Psychiatric: Normal mood and affect.  Laboratory Data: Lab Results  Component Value Date   WBC 10.4 08/23/2016   HGB 11.9 (L) 08/23/2016   HCT 34.9 (L) 08/23/2016   MCV 89.3 08/23/2016   PLT 219 08/23/2016    Lab Results  Component Value Date   CREATININE 0.74 08/23/2016    No results found for: PSA  No results found for: TESTOSTERONE  Lab Results  Component Value Date   HGBA1C 6.9 (H) 08/23/2016    No results found for: TSH     Component Value Date/Time   CHOL 139 08/23/2016 0305   HDL 45 08/23/2016 0305   CHOLHDL 3.1 08/23/2016 0305   VLDL 13 08/23/2016 0305   LDLCALC 81 08/23/2016 0305    Lab Results  Component Value Date   AST 31  08/22/2016   Lab Results  Component Value Date   ALT 27 08/22/2016   No components found for: ALKALINEPHOPHATASE No components found for: BILIRUBINTOTAL  No results found for: ESTRADIOL  Urinalysis    Component Value Date/Time   COLORURINE STRAW (A) 08/22/2016 2022   APPEARANCEUR CLEAR (A) 08/22/2016 2022   LABSPEC 1.004 (L) 08/22/2016 2022   PHURINE 6.0 08/22/2016 2022   GLUCOSEU NEGATIVE 08/22/2016 2022   HGBUR SMALL (A) 08/22/2016 2022   BILIRUBINUR NEGATIVE 08/22/2016 2022   KETONESUR NEGATIVE 08/22/2016 2022   PROTEINUR NEGATIVE 08/22/2016 2022   NITRITE NEGATIVE 08/22/2016 2022   LEUKOCYTESUR NEGATIVE 08/22/2016 2022    I have reviewed the labs.  Pertinent Imaging: Results for HASANA, ALCORTA (MRN [id number]) as of 02/25/2018 10:32  Ref. Range 02/25/2018 10:18  Scan Result Unknown 23ml     Assessment & Plan:    1. Mixed incontinence Discussed offered behavioral therapies, bladder training and bladder control strategies Offered pelvic floor muscle training, but it is cost prohibitive Could not tolerate anticholinergic therapy offered medical therapy with beta-3 adrenergic receptor agonist - would like to try the beta-3 adrenergic receptor agonist (Myrbetriq).  Given Myrbetriq 50 mg samples, #28.  I have reviewed with the patient of the side effects of Myrbetriq, such as: elevation in BP, urinary retention and/or HA.   Explained that the medication would not address stress urinary incontinence and offered referral for pessary placement, but patient deferred at this time RTC in 3 weeks for PVR and OAB questionnaire  2. Vaginal atrophy Patient was given a sample of vaginal estrogen cream (Premarin vaginal cream) and instructed to apply 0.5mg  (pea-sized amount)  just inside the vaginal introitus with a finger-tip on Monday, Wednesday and Friday nights.  I explained to the patient that vaginally administered estrogen, which causes only a slight increase in the blood  estrogen levels, have fewer contraindications and adverse systemic effects that oral HT. I have also given prescriptions for the Estrace cream and Premarin cream, so that the patient may carry them to the pharmacy to see which one of the branded creams would be most economical for her.  If she finds both medications cost prohibitive, she is instructed to call the office.  We can then call in a compounded vaginal estrogen cream for the patient that may be more affordable.   She will follow up in three months for an exam.    3.  Interstitial cystitis It may be likely that patient has a component of interstitial cystitis since she is been experiencing the symptomatology for most of her life We will address the bothersome symptoms of urgency and urge incontinence, patient has had cystoscopies in the past and stated that they were painful and will not undergo another cystoscopic exam unless she was under general anesthesia If conservative therapies and medications did not address her symptoms it may be something we will need to consider in the future   Return in about 3 weeks (around 03/18/2018) for PVR and OAB questionnaire.  These notes generated with voice recognition software. I apologize for typographical errors.  Zara Council, PA-C  [facility] [address] [place], [place] [address] [phone]

## 2018-02-25 ENCOUNTER — Ambulatory Visit (INDEPENDENT_AMBULATORY_CARE_PROVIDER_SITE_OTHER): Payer: Medicare HMO | Admitting: Urology

## 2018-02-25 ENCOUNTER — Encounter: Payer: Self-pay | Admitting: Urology

## 2018-02-25 VITALS — BP 128/69 | HR 65 | Wt 151.0 lb

## 2018-02-25 DIAGNOSIS — N3010 Interstitial cystitis (chronic) without hematuria: Secondary | ICD-10-CM | POA: Diagnosis not present

## 2018-02-25 DIAGNOSIS — N3946 Mixed incontinence: Secondary | ICD-10-CM | POA: Diagnosis not present

## 2018-02-25 DIAGNOSIS — N952 Postmenopausal atrophic vaginitis: Secondary | ICD-10-CM | POA: Diagnosis not present

## 2018-02-25 LAB — BLADDER SCAN AMB NON-IMAGING

## 2018-02-25 MED ORDER — ESTROGENS, CONJUGATED 0.625 MG/GM VA CREA: TOPICAL_CREAM | VAGINAL | 12 refills | Status: DC

## 2018-02-25 MED ORDER — ESTRADIOL 0.1 MG/GM VA CREA: TOPICAL_CREAM | VAGINAL | 12 refills | Status: DC

## 2018-02-25 NOTE — Patient Instructions (Addendum)
Interstitial Cystitis Interstitial cystitis is a condition that causes inflammation of the bladder. The bladder is a hollow organ in the lower part of your abdomen. It stores urine after the urine is made by your kidneys. With interstitial cystitis, you may have pain in the bladder area. You may also have a frequent and urgent need to urinate. The severity of interstitial cystitis can vary from person to person. You may have flare-ups of the condition, and then it may go away for a while. For many people who have this condition, it becomes a long-term problem. What are the causes? The cause of this condition is not known. What increases the risk? This condition is more likely to develop in women. What are the signs or symptoms? Symptoms of interstitial cystitis vary, and they can change over time. Symptoms may include:  Discomfort or pain in the bladder area. This can range from mild to severe. The pain may change in intensity as the bladder fills with urine or as it empties.  Pelvic pain.  An urgent need to urinate.  Frequent urination.  Pain during sexual intercourse.  Pinpoint bleeding on the bladder wall.  For women, the symptoms often get worse during menstruation. How is this diagnosed? This condition is diagnosed by evaluating your symptoms and ruling out other causes. A physical exam will be done. Various tests may be done to rule out other conditions. Common tests include:  Urine tests.  Cystoscopy. In this test, a tool that is like a very thin telescope is used to look into your bladder.  Biopsy. This involves taking a sample of tissue from the bladder wall to be examined under a microscope.  How is this treated? There is no cure for interstitial cystitis, but treatment methods are available to control your symptoms. Work closely with your health care provider to find the treatments that will be most effective for you. Treatment options may include:  Medicines to relieve  pain and to help reduce the number of times that you feel the need to urinate.  Bladder training. This involves learning ways to control when you urinate, such as: ? Urinating at scheduled times. ? Training yourself to delay urination. ? Doing exercises (Kegel exercises) to strengthen the muscles that control urine flow.  Lifestyle changes, such as changing your diet or taking steps to control stress.  Use of a device that provides electrical stimulation in order to reduce pain.  A procedure that stretches your bladder by filling it with air or fluid.  Surgery. This is rare. It is only done for extreme cases if other treatments do not help.  Follow these instructions at home:  Take medicines only as directed by your health care provider.  Use bladder training techniques as directed. ? Keep a bladder diary to find out which foods, liquids, or activities make your symptoms worse. ? Use your bladder diary to schedule bathroom trips. If you are away from home, plan to be near a bathroom at each of your scheduled times. ? Make sure you urinate just before you leave the house and just before you go to bed.  Do Kegel exercises as directed by your health care provider.  Do not drink alcohol.  Do not use any tobacco products, including cigarettes, chewing tobacco, or electronic cigarettes. If you need help quitting, ask your health care provider.  Make dietary changes as directed by your health care provider. You may need to avoid spicy foods and foods that contain a high amount  of potassium.  Limit your drinking of beverages that stimulate urination. These include soda, coffee, and tea.  Keep all follow-up visits as directed by your health care provider. This is important. Contact a health care provider if:  Your symptoms do not get better after treatment.  Your pain and discomfort are getting worse.  You have more frequent urges to urinate.  You have a fever. Get help right away  if:  You are not able to control your bladder at all. This information is not intended to replace advice given to you by your health care provider. Make sure you discuss any questions you have with your health care provider. Document Released: 03/28/2004 Document Revised: 01/03/2016 Document Reviewed: 04/04/2014 Elsevier Interactive Patient Education  [employer].   I have given you two prescriptions for a vaginal estrogen cream.  Estrace and Premarin.  Please take these to your pharmacy and see which one your insurance covers.  If both are too expensive, please call the office at [phone] for an alternative.  You are given a sample of vaginal estrogen cream Premarin and instructed to apply 0.5mg  (pea-sized amount)  just inside the vaginal introitus with a finger-tip on Monday, Wednesday and Friday nights,    Mirabegron extended-release tablets What is this medicine? MIRABEGRON (MIR a BEG ron) is used to treat overactive bladder. This medicine reduces the amount of bathroom visits. It may also help to control wetting accidents. This medicine may be used for other purposes; ask your health care provider or pharmacist if you have questions. COMMON BRAND NAME(S): Myrbetriq What should I tell my health care provider before I take this medicine? They need to know if you have any of these conditions: -difficulty passing urine -high blood pressure -kidney disease -liver disease -an unusual or allergic reaction to mirabegron, other medicines, foods, dyes, or preservatives -pregnant or trying to get pregnant -breast-feeding How should I use this medicine? Take this medicine by mouth with a glass of water. Follow the directions on the prescription label. Do not cut, crush or chew this medicine. You can take it with or without food. If it upsets your stomach, take it with food. Take your medicine at regular intervals. Do not take it more often than directed. Do not stop taking except on  your doctor's advice. Talk to your pediatrician regarding the use of this medicine in children. Special care may be needed. Overdosage: If you think you have taken too much of this medicine contact a poison control center or emergency room at once. NOTE: This medicine is only for you. Do not share this medicine with others. What if I miss a dose? If you miss a dose, take it as soon as you can. If it is almost time for your next dose, take only that dose. Do not take double or extra doses. What may interact with this medicine? -certain medicines for bladder problems like fesoterodine, oxybutynin, solifenacin, tolterodine -desipramine -digoxin -flecainide -ketoconazole -MAOIs like Carbex, Eldepryl, Marplan, Nardil, and Parnate -metoprolol -propafenone -thioridazine -warfarin This list may not describe all possible interactions. Give your health care provider a list of all the medicines, herbs, non-prescription drugs, or dietary supplements you use. Also tell them if you smoke, drink alcohol, or use illegal drugs. Some items may interact with your medicine. What should I watch for while using this medicine? It may take 8 weeks to notice the full benefit from this medicine. You may need to limit your intake tea, coffee, caffeinated sodas, and alcohol.  These drinks may make your symptoms worse. Visit your doctor or health care professional for regular checks on your progress. Check your blood pressure as directed. Ask your doctor or health care professional what your blood pressure should be and when you should contact him or her. What side effects may I notice from receiving this medicine? Side effects that you should report to your doctor or health care professional as soon as possible: -allergic reactions like skin rash, itching or hives, swelling of the face, lips, or tongue -chest pain or palpitations -severe or sudden headache -high blood pressure -fast, irregular heartbeat -redness,  blistering, peeling or loosening of the skin, including inside the mouth -signs of infection like fever or chills; cough; sore throat; pain or difficulty passing urine -trouble passing urine or change in the amount of urine Side effects that usually do not require medical attention (report to your doctor or health care professional if they continue or are bothersome): -constipation -diarrhea -dizziness -dry eyes -joint pain -mild headache -nausea -runny nose This list may not describe all possible side effects. Call your doctor for medical advice about side effects. You may report side effects to FDA at 1-800-FDA-1088. Where should I keep my medicine? Keep out of the reach of children. Store at room temperature between 15 and 30 degrees C (59 and 86 degrees F). Throw away any unused medicine after the expiration date. NOTE: This sheet is a summary. It may not cover all possible information. If you have questions about this medicine, talk to your doctor, pharmacist, or health care provider.  2018 Elsevier/Gold Standard (2015-08-30 12:14:30)

## 2018-03-18 ENCOUNTER — Ambulatory Visit: Payer: Medicare HMO | Admitting: Urology

## 2018-03-22 NOTE — Progress Notes (Signed)
03/23/2018 11:08 AM   [name] [name] 01-Apr-1941 [id number]  Referring provider: Kirk Ruths, MD [name] [name], [name]  Chief Complaint  Patient presents with  . Over Active Bladder    HPI: Patient is a 77 year old Caucasian female with mixed incontinence, vaginal atrophy and interstitial cystitis who presents today for a 3-week follow-up after trial of Myrbetriq.  Background history Patient is a 69 -year-old Caucasian female who is referred to [place] by Dr. Kirk Ruths for urinary incontinence.  She states that she always had issues with her bladder.  As a child, she had incontinent episodes and was placed on "green medicine" that tasted awful.  She was also a bed wetting until the age of 60.  She also had to wear pads as a teenager.  From the age of 7 until the few years, she managed her urge incontinence without accidents.  She states she has noticed a worsening of her urinary symptoms over the last year.  Patient has incontinence with urgency and SUI.  She is experiencing a lot incontinent episodes during the day. She is experiencing occasional incontinent episodes during the night.  Her incontinence volume is large.   She is wearing 7 to 8 pads/depends daily.  She is having associated urinary frequency, urgency, nocturia x 1-2, intermittency and straining to urinate.  Patient denies any gross hematuria, dysuria or suprapubic/flank pain.  Patient denies any fevers, chills, nausea or vomiting.  Her PVR is 0 mL.  She does not have a history of urinary tract infections, STI's or injury to the bladder.   She does not have a history of nephrolithiasis, GU surgery or GU trauma.  She has had several cystoscopies with Dr. Madelin Headings.  She is not sexually active.  She is post menopausal.  She admits to constipation and/or diarrhea.   She is drinking 24 ounces of water daily.   She has a cappuccino daily.  She drinks one bottle of diet  Pepsi.  She does not drink juices.  She does not drink tea.  She does not drink alcohol.  She is not a smoker.   She was tried on Vesicare 5 mg daily, but she states it made her dizzy.    Today,  patient has been experiencing urgency x 4-7, frequency x 8 or more, not restricting fluids to avoid visits to the restroom, is engaging in toilet mapping, incontinence x 0-3 and nocturia x 0-3.   PVR is 0 mL.  Her BP is 146/63.  Patient denies any gross hematuria, dysuria or suprapubic/flank pain.  Patient denies any fevers, chills, nausea or vomiting.  She has noticed a 95% improvement in her urinary symptoms at this time.    She states she couldn't use the vaginal cream as it made her "sore down there."      PMH: Past Medical History:  Diagnosis Date  . Anemia   . Arthritis   . Cancer ([place])    melanoma  . Chronic kidney disease    stage 3  . Complication of anesthesia    sometimes hard to wake up  . Diabetes ([place])    diet controlled  . 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 GERD (gastroesophageal reflux disease)   . Headache   . Hyperlipemia   . Hypertension   . Pain    chronic  . Rosacea   . 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 Sleep apnea    cpap    Surgical History: Past Surgical History:  Procedure Laterality Date  . ABDOMINAL HYSTERECTOMY    . BACK SURGERY     lumbar fusion, laminectomy  . BLEPHAROPLASTY Bilateral 10/24/14   Dr. Loni Muse. Vickki Muff, MBSC  . BREAST SURGERY     reduction  . CHOLECYSTECTOMY    . COLONOSCOPY WITH PROPOFOL N/A 03/17/2016   Procedure: COLONOSCOPY WITH PROPOFOL;  Surgeon: Manya Silvas, MD;  Location: [facility];  Service: Endoscopy;  Laterality: N/A;  . rcr    . REDUCTION MAMMAPLASTY Bilateral 1985  . SHOULDER ARTHROSCOPY WITH OPEN ROTATOR CUFF REPAIR Right 06/22/2015   Procedure: SHOULDER ARTHROSCOPY WITH SUBACROMIAL DECOMPRESSION, RELEASE OF LONG HEAD OF BICEPS TENDON, RESECTION OF DISTAL CLAVICLE;   Surgeon: Leanor Kail, MD;  Location: [facility];  Service: Orthopedics;  Laterality: Right;  DIABETIC - diet controlled CPAP   . SKIN CANCER EXCISION     leg, back    Home Medications:  Allergies as of 03/23/2018      Reactions   Contrast Media [iodinated Diagnostic Agents] Other (See Comments)   Made hands and feet "draw up"   Tramadol Other (See Comments)   Other Reaction: OTHER REACTION, SOB, AGITATIO   Adhesive [tape] Rash   Some bandaids   Penicillins Rash, Other (See Comments)   Has patient had a PCN reaction causing immediate rash, facial/tongue/throat swelling, SOB or lightheadedness with hypotension: Yes Has patient had a PCN reaction causing severe rash involving mucus membranes or skin necrosis: No Has patient had a PCN reaction that required hospitalization No Has patient had a PCN reaction occurring within the last 10 years: No If all of the above answers are "NO", then may proceed with Cephalosporin use.      Medication List        Accurate as of 03/23/18 11:08 AM. Always use your most recent med list.          acetaminophen 500 MG tablet Commonly known as:  TYLENOL Take 500 mg by mouth every 8 (eight) hours as needed for mild pain.   aspirin EC 81 MG tablet Take 1 tablet (81 mg total) by mouth daily.   atorvastatin 80 MG tablet Commonly known as:  LIPITOR TAKE 1 TABVLET BY MOUTH EVERY DAY   BAYER MICROLET LANCETS lancets once daily. Use as instructed.dx e11.22 CONTOUR NEXT EZ TESTER   carvedilol 12.5 MG tablet Commonly known as:  COREG Take 12.5 mg by mouth 2 (two) times daily with a meal.   conjugated estrogens vaginal cream Commonly known as:  PREMARIN Apply 0.5mg  (pea-sized amount)  just inside the vaginal introitus with a finger-tip on  Monday, Wednesday and Friday nights.   CONTOUR NEXT TEST test strip Generic drug:  glucose blood Use once daily. Use as instructed.dx e11.9   doxycycline 100 MG tablet Commonly known as:   VIBRA-TABS TAKE 1 TABLET EVERY DAY TO EVERY OTHER DAY AS DIRECTED   estradiol 0.1 MG/GM vaginal cream Commonly known as:  ESTRACE Apply 0.5mg  (pea-sized amount)  just inside the vaginal introitus with a finger-tip on Monday, Wednesday and Friday nights.   gabapentin 600 MG tablet Commonly known as:  NEURONTIN 600 mg 3 (three) times daily.   HYZAAR 100-25 MG tablet Generic drug:  losartan-hydrochlorothiazide Take 1 tablet by mouth daily.   mirabegron ER 50 MG Tb24 tablet Commonly known as:  MYRBETRIQ Take 1 tablet (50 mg total) by mouth daily.  PRILOSEC 40 MG capsule Generic drug:  omeprazole Take 40 mg by mouth daily.   traZODone 50 MG tablet Commonly known as:  DESYREL Take 100 mg by mouth at bedtime.       Allergies:  Allergies  Allergen Reactions  . Contrast Media [Iodinated Diagnostic Agents] Other (See Comments)    Made hands and feet "draw up"  . Tramadol Other (See Comments)    Other Reaction: OTHER REACTION, SOB, AGITATIO  . Adhesive [Tape] Rash    Some bandaids  . Penicillins Rash and Other (See Comments)    Has patient had a PCN reaction causing immediate rash, facial/tongue/throat swelling, SOB or lightheadedness with hypotension: Yes Has patient had a PCN reaction causing severe rash involving mucus membranes or skin necrosis: No Has patient had a PCN reaction that required hospitalization No Has patient had a PCN reaction occurring within the last 10 years: No If all of the above answers are "NO", then may proceed with Cephalosporin use.     Family History: Family History  Problem Relation Age of Onset  . Aneurysm Father   . Hypertension Father   . Breast cancer Neg Hx     Social History:  reports that she has never smoked. She has never used smokeless tobacco. She reports that she does not drink alcohol or use drugs.  ROS: UROLOGY Frequent Urination?: Yes Hard to postpone urination?: Yes Burning/pain with urination?: No Get up at night to  urinate?: No Leakage of urine?: Yes Urine stream starts and stops?: No Trouble starting stream?: No Do you have to strain to urinate?: No Blood in urine?: No Urinary tract infection?: No Sexually transmitted disease?: No Injury to kidneys or bladder?: No Painful intercourse?: No Weak stream?: No Currently pregnant?: No Vaginal bleeding?: No Last menstrual period?: n  Gastrointestinal Nausea?: No Vomiting?: No Indigestion/heartburn?: No Diarrhea?: No Constipation?: No  Constitutional Fever: No Night sweats?: No Weight loss?: No Fatigue?: No  Skin Skin rash/lesions?: No Itching?: No  Eyes Blurred vision?: No Double vision?: No  Ears/Nose/Throat Sore throat?: No Sinus problems?: No  Hematologic/Lymphatic Swollen glands?: No Easy bruising?: Yes  Cardiovascular Leg swelling?: No Chest pain?: No  Respiratory Cough?: No Shortness of breath?: No  Endocrine Excessive thirst?: No  Musculoskeletal Back pain?: No Joint pain?: Yes  Neurological Headaches?: No Dizziness?: No  Psychologic Depression?: No Anxiety?: No  Physical Exam: BP (!) 146/63 (BP Location: Left Arm, Patient Position: Sitting, Cuff Size: Normal)   Pulse 68   Ht 4\' 11"  (1.499 m)   Wt 154 lb 3.2 oz (69.9 kg)   BMI 31.14 kg/m   Constitutional: Well nourished. Alert and oriented, No acute distress. HEENT: [place] AT, moist mucus membranes. Trachea midline, no masses. Cardiovascular: No clubbing, cyanosis, or edema. Respiratory: Normal respiratory effort, no increased work of breathing. Skin: No rashes, bruises or suspicious lesions. Lymph: No cervical or inguinal adenopathy. Neurologic: Grossly intact, no focal deficits, moving all 4 extremities. Psychiatric: Normal mood and affect.  Laboratory Data: Lab Results  Component Value Date   WBC 10.4 08/23/2016   HGB 11.9 (L) 08/23/2016   HCT 34.9 (L) 08/23/2016   MCV 89.3 08/23/2016   PLT 219 08/23/2016    Lab Results  Component  Value Date   CREATININE 0.74 08/23/2016    No results found for: PSA  No results found for: TESTOSTERONE  Lab Results  Component Value Date   HGBA1C 6.9 (H) 08/23/2016    No results found for: TSH     Component  Value Date/Time   CHOL 139 08/23/2016 0305   HDL 45 08/23/2016 0305   CHOLHDL 3.1 08/23/2016 0305   VLDL 13 08/23/2016 0305   LDLCALC 81 08/23/2016 0305    Lab Results  Component Value Date   AST 31 08/22/2016   Lab Results  Component Value Date   ALT 27 08/22/2016   No components found for: ALKALINEPHOPHATASE No components found for: BILIRUBINTOTAL  No results found for: ESTRADIOL  Urinalysis    Component Value Date/Time   COLORURINE STRAW (A) 08/22/2016 2022   APPEARANCEUR CLEAR (A) 08/22/2016 2022   LABSPEC 1.004 (L) 08/22/2016 2022   PHURINE 6.0 08/22/2016 2022   GLUCOSEU NEGATIVE 08/22/2016 2022   HGBUR SMALL (A) 08/22/2016 2022   BILIRUBINUR NEGATIVE 08/22/2016 2022   KETONESUR NEGATIVE 08/22/2016 2022   PROTEINUR NEGATIVE 08/22/2016 2022   NITRITE NEGATIVE 08/22/2016 2022   LEUKOCYTESUR NEGATIVE 08/22/2016 2022    I have reviewed the labs.  Pertinent Imaging: Results for ALEIGHYA, MCANELLY (MRN [id number]) as of 03/23/2018 11:09  Ref. Range 03/23/2018 10:47  Scan Result Unknown 75ml    Assessment & Plan:    1. Mixed incontinence Continue Myrbetriq 50 mg daily Script sent to pharmacy Samples given in case she finds it cost prohibitive we can pursue other avenues of coverage and she doesn't miss medication RTC in three months for OAB questionnaire and PVR   2. Vaginal atrophy Could not tolerate cream  3.  Interstitial cystitis It may be likely that patient has a component of interstitial cystitis since she is been experiencing the symptomatology for most of her life We will address the bothersome symptoms of urgency and urge incontinence, patient has had cystoscopies in the past and stated that they were painful and will not undergo  another cystoscopic exam unless she was under general anesthesia If conservative therapies and medications did not address her symptoms it may be something we will need to consider in the future   Return in about 3 months (around 06/23/2018) for PVR and OAB questionnaire.  These notes generated with voice recognition software. I apologize for typographical errors.  Zara Council, PA-C  [facility] [address] [place] ,  [address] [phone]

## 2018-03-23 ENCOUNTER — Encounter: Payer: Self-pay | Admitting: Urology

## 2018-03-23 ENCOUNTER — Ambulatory Visit: Payer: Medicare HMO | Admitting: Urology

## 2018-03-23 VITALS — BP 146/63 | HR 68 | Ht 59.0 in | Wt 154.2 lb

## 2018-03-23 DIAGNOSIS — N3946 Mixed incontinence: Secondary | ICD-10-CM

## 2018-03-23 DIAGNOSIS — N3010 Interstitial cystitis (chronic) without hematuria: Secondary | ICD-10-CM | POA: Diagnosis not present

## 2018-03-23 DIAGNOSIS — N952 Postmenopausal atrophic vaginitis: Secondary | ICD-10-CM

## 2018-03-23 LAB — BLADDER SCAN AMB NON-IMAGING

## 2018-03-23 MED ORDER — MIRABEGRON ER 50 MG PO TB24: 50.0000 mg | ORAL_TABLET | Freq: Every day | ORAL | 3 refills | Status: DC

## 2018-03-29 ENCOUNTER — Telehealth: Payer: Self-pay | Admitting: Urology

## 2018-03-29 NOTE — Telephone Encounter (Signed)
error 

## 2018-05-05 ENCOUNTER — Other Ambulatory Visit: Payer: Self-pay | Admitting: Internal Medicine

## 2018-05-05 DIAGNOSIS — Z1231 Encounter for screening mammogram for malignant neoplasm of breast: Secondary | ICD-10-CM

## 2018-05-21 ENCOUNTER — Ambulatory Visit
Admission: RE | Admit: 2018-05-21 | Discharge: 2018-05-21 | Disposition: A | Discharge status: Home / Self Care | Payer: Medicare HMO | Source: Ambulatory Visit | Attending: Internal Medicine | Admitting: Internal Medicine

## 2018-05-21 DIAGNOSIS — Z1231 Encounter for screening mammogram for malignant neoplasm of breast: Secondary | ICD-10-CM

## 2018-06-22 DIAGNOSIS — G479 Sleep disorder, unspecified: Secondary | ICD-10-CM | POA: Insufficient documentation

## 2018-06-22 DIAGNOSIS — M25561 Pain in right knee: Secondary | ICD-10-CM | POA: Insufficient documentation

## 2018-06-23 NOTE — Progress Notes (Signed)
06/24/2018 12:15 PM   [name] Mar 21, 1941 [id number]  Referring provider: Kirk Ruths, MD [facility] [place], [place] [address]  Chief Complaint  Patient presents with  . Follow-up    3 months    HPI: Patient is a 77 year old Caucasian female with mixed incontinence, vaginal atrophy and interstitial cystitis who presented on 03/29/2018 for a 64-month follow-up.    Background history Patient is a 58 -year-old Caucasian female who is referred to [place] by Dr. Kirk Ruths for urinary incontinence.  She states that she always had issues with her bladder.  As a child, she had incontinent episodes and was placed on "green medicine" that tasted awful.  She was also a bed wetting until the age of 16.  She also had to wear pads as a teenager.  From the age of 81 until the few years, she managed her urge incontinence without accidents.  She stated she has noticed a worsening of her urinary symptoms over the last year.  Patient has incontinence with urgency and SUI.  She was experiencing a lot incontinent episodes during the day. She is experiencing occasional incontinent episodes during the night.  Her incontinence volume is large.   She is wearing 7 to 8 pads/depends daily.  She is having associated urinary frequency, urgency, nocturia x 1-2, intermittency and straining to urinate.  Patient denies any gross hematuria, dysuria or suprapubic/flank pain.  Patient denies any fevers, chills, nausea or vomiting.  Her PVR is 0 mL.  She does not have a history of urinary tract infections, STI's or injury to the bladder.   She does not have a history of nephrolithiasis, GU surgery or GU trauma.  She has had several cystoscopies with Dr. Madelin Headings.  She was not sexually active.  She was post menopausal.  She admits to constipation and/or diarrhea.   She is drinking 24 ounces of water daily.   She has a cappuccino daily.  She drinks one bottle of diet Pepsi.   She does not drink juices.  She does not drink tea.  She does not drink alcohol.  She is not a smoker.   She was tried on Vesicare 5 mg daily, but she states it made her dizzy.   patient has been experiencing urgency x 4-7, frequency x 8 or more, not restricting fluids to avoid visits to the restroom, is engaging in toilet mapping, incontinence x 0-3 and nocturia x 0-3.   PVR is 0 mL.  Her BP is 146/63.  Patient denies any gross hematuria, dysuria or suprapubic/flank pain.  Patient denies any fevers, chills, nausea or vomiting.  She has noticed a 95% improvement in her urinary symptoms at this time.   She stated she couldn't use the vaginal cream as it made her "sore down there."    Today the pt returns for evaluation and management of mixed incontinence. Pt reports that she would prefer cheaper option than Myrbetriq. Pt reports being light-headed after taking Vesicare. Patient denies any gross hematuria, dysuria or suprapubic/flank pain.  Patient denies any fevers, chills, nausea or vomiting.   The patient has been experiencing urgency x 0-3 (improved), frequency x 8 (stable), not restricting fluids to avoid visits to the restroom, is engaging in toilet mapping, incontinence x 0-3 (stable) and nocturia x 0-3 (improved)  PMH: Past Medical History:  Diagnosis Date  . Anemia   . Arthritis   . Cancer ([place])    melanoma  .  Chronic kidney disease    stage 3  . Complication of anesthesia    sometimes hard to wake up  . Diabetes ([place])    diet controlled  . 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 GERD (gastroesophageal reflux disease)   . Headache   . Hyperlipemia   . Hypertension   . Pain    chronic  . Rosacea   . 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 Sleep apnea    cpap    Surgical History: Past Surgical History:  Procedure Laterality Date  . ABDOMINAL HYSTERECTOMY    . BACK SURGERY     lumbar fusion, laminectomy  . BLEPHAROPLASTY Bilateral 10/24/14    Dr. Loni Muse. Vickki Muff, MBSC  . BREAST SURGERY     reduction  . CHOLECYSTECTOMY    . COLONOSCOPY WITH PROPOFOL N/A 03/17/2016   Procedure: COLONOSCOPY WITH PROPOFOL;  Surgeon: Manya Silvas, MD;  Location: [facility] ENDOSCOPY;  Service: Endoscopy;  Laterality: N/A;  . rcr    . REDUCTION MAMMAPLASTY Bilateral 1985  . SHOULDER ARTHROSCOPY WITH OPEN ROTATOR CUFF REPAIR Right 06/22/2015   Procedure: SHOULDER ARTHROSCOPY WITH SUBACROMIAL DECOMPRESSION, RELEASE OF LONG HEAD OF BICEPS TENDON, RESECTION OF DISTAL CLAVICLE;  Surgeon: Leanor Kail, MD;  Location: [facility];  Service: Orthopedics;  Laterality: Right;  DIABETIC - diet controlled CPAP   . SKIN CANCER EXCISION     leg, back    Home Medications:  Allergies as of 06/24/2018      Reactions   Contrast Media [iodinated Diagnostic Agents] Other (See Comments)   Made hands and feet "draw up"   Tramadol Other (See Comments)   Other Reaction: OTHER REACTION, SOB, AGITATIO   Adhesive [tape] Rash   Some bandaids   Penicillins Rash, Other (See Comments)   Has patient had a PCN reaction causing immediate rash, facial/tongue/throat swelling, SOB or lightheadedness with hypotension: Yes Has patient had a PCN reaction causing severe rash involving mucus membranes or skin necrosis: No Has patient had a PCN reaction that required hospitalization No Has patient had a PCN reaction occurring within the last 10 years: No If all of the above answers are "NO", then may proceed with Cephalosporin use.      Medication List        Accurate as of 06/24/18 12:15 PM. Always use your most recent med list.          acetaminophen 500 MG tablet Commonly known as:  TYLENOL Take 500 mg by mouth every 8 (eight) hours as needed for mild pain.   aspirin EC 81 MG tablet Take 1 tablet (81 mg total) by mouth daily.   atorvastatin 80 MG tablet Commonly known as:  LIPITOR TAKE 1 TABVLET BY MOUTH EVERY DAY   BAYER MICROLET LANCETS lancets once daily.  Use as instructed.dx e11.22 CONTOUR NEXT EZ TESTER   carvedilol 12.5 MG tablet Commonly known as:  COREG Take 12.5 mg by mouth 2 (two) times daily with a meal.   conjugated estrogens vaginal cream Commonly known as:  PREMARIN Apply 0.5mg  (pea-sized amount)  just inside the vaginal introitus with a finger-tip on  Monday, Wednesday and Friday nights.   CONTOUR NEXT TEST test strip Generic drug:  glucose blood Use once daily. Use as instructed.dx e11.9   doxycycline 100 MG tablet Commonly known as:  VIBRA-TABS TAKE 1 TABLET EVERY DAY TO EVERY OTHER DAY AS DIRECTED   estradiol 0.1 MG/GM  vaginal cream Commonly known as:  ESTRACE Apply 0.5mg  (pea-sized amount)  just inside the vaginal introitus with a finger-tip on Monday, Wednesday and Friday nights.   gabapentin 600 MG tablet Commonly known as:  NEURONTIN 600 mg 3 (three) times daily.   HYZAAR 100-25 MG tablet Generic drug:  losartan-hydrochlorothiazide Take 1 tablet by mouth daily.   mirabegron ER 50 MG Tb24 tablet Commonly known as:  MYRBETRIQ Take 1 tablet (50 mg total) by mouth daily.   PRILOSEC 40 MG capsule Generic drug:  omeprazole Take 40 mg by mouth daily.   traZODone 50 MG tablet Commonly known as:  DESYREL Take 100 mg by mouth at bedtime.       Allergies:  Allergies  Allergen Reactions  . Contrast Media [Iodinated Diagnostic Agents] Other (See Comments)    Made hands and feet "draw up"  . Tramadol Other (See Comments)    Other Reaction: OTHER REACTION, SOB, AGITATIO  . Adhesive [Tape] Rash    Some bandaids  . Penicillins Rash and Other (See Comments)    Has patient had a PCN reaction causing immediate rash, facial/tongue/throat swelling, SOB or lightheadedness with hypotension: Yes Has patient had a PCN reaction causing severe rash involving mucus membranes or skin necrosis: No Has patient had a PCN reaction that required hospitalization No Has patient had a PCN reaction occurring within the last 10  years: No If all of the above answers are "NO", then may proceed with Cephalosporin use.     Family History: Family History  Problem Relation Age of Onset  . Aneurysm Father   . Hypertension Father   . Breast cancer Neg Hx     Social History:  reports that she has never smoked. She has never used smokeless tobacco. She reports that she does not drink alcohol or use drugs.  ROS: UROLOGY Frequent Urination?: Yes Hard to postpone urination?: No Burning/pain with urination?: No Get up at night to urinate?: No Leakage of urine?: No Urine stream starts and stops?: No Trouble starting stream?: No Do you have to strain to urinate?: No Blood in urine?: No Urinary tract infection?: No Sexually transmitted disease?: No Injury to kidneys or bladder?: No Painful intercourse?: No Weak stream?: No Currently pregnant?: No Vaginal bleeding?: No  Gastrointestinal Nausea?: No Vomiting?: No Indigestion/heartburn?: No Diarrhea?: No Constipation?: No  Constitutional Fever: No Night sweats?: No Weight loss?: No Fatigue?: No  Skin Skin rash/lesions?: No Itching?: No  Eyes Blurred vision?: No Double vision?: No  Ears/Nose/Throat Sore throat?: No Sinus problems?: No  Hematologic/Lymphatic Swollen glands?: No Easy bruising?: No  Cardiovascular Leg swelling?: No Chest pain?: No  Respiratory Cough?: No Shortness of breath?: No  Endocrine Excessive thirst?: No  Musculoskeletal Back pain?: No Joint pain?: Yes  Neurological Headaches?: No Dizziness?: No  Psychologic Depression?: No Anxiety?: No  Physical Exam: BP (!) 148/79 (BP Location: Left Arm, Patient Position: Sitting, Cuff Size: Normal)   Pulse 67   Ht 4\' 11"  (1.499 m)   Wt 153 lb 3.2 oz (69.5 kg)   BMI 30.94 kg/m   Constitutional: Well nourished. Alert and oriented, No acute distress. HEENT: [place] AT, moist mucus membranes. Trachea midline, no masses. Cardiovascular: No clubbing, cyanosis, or  edema. Respiratory: Normal respiratory effort, no increased work of breathing. Skin: No rashes, bruises or suspicious lesions. Neurologic: Grossly intact, no focal deficits, moving all 4 extremities. Psychiatric: Normal mood and affect.   Laboratory Data: Lab Results  Component Value Date   WBC 10.4 08/23/2016  HGB 11.9 (L) 08/23/2016   HCT 34.9 (L) 08/23/2016   MCV 89.3 08/23/2016   PLT 219 08/23/2016    Lab Results  Component Value Date   CREATININE 0.74 08/23/2016    No results found for: PSA  No results found for: TESTOSTERONE  Lab Results  Component Value Date   HGBA1C 6.9 (H) 08/23/2016    No results found for: TSH     Component Value Date/Time   CHOL 139 08/23/2016 0305   HDL 45 08/23/2016 0305   CHOLHDL 3.1 08/23/2016 0305   VLDL 13 08/23/2016 0305   LDLCALC 81 08/23/2016 0305    Lab Results  Component Value Date   AST 31 08/22/2016   Lab Results  Component Value Date   ALT 27 08/22/2016   No components found for: ALKALINEPHOPHATASE No components found for: BILIRUBINTOTAL  No results found for: ESTRADIOL  Urinalysis    Component Value Date/Time   COLORURINE STRAW (A) 08/22/2016 2022   APPEARANCEUR CLEAR (A) 08/22/2016 2022   LABSPEC 1.004 (L) 08/22/2016 2022   PHURINE 6.0 08/22/2016 2022   GLUCOSEU NEGATIVE 08/22/2016 2022   HGBUR SMALL (A) 08/22/2016 2022   BILIRUBINUR NEGATIVE 08/22/2016 2022   KETONESUR NEGATIVE 08/22/2016 2022   PROTEINUR NEGATIVE 08/22/2016 2022   NITRITE NEGATIVE 08/22/2016 2022   LEUKOCYTESUR NEGATIVE 08/22/2016 2022    I have reviewed the labs.  Pertinent Imaging: Results for KEYSI, OELKERS (MRN [id number]) as of 06/24/2018 12:17  Ref. Range 06/24/2018 11:41  Scan Result Unknown 0 ml     Assessment & Plan:    1. Mixed incontinence Continue Myrbetriq 50 mg daily Script sent to pharmacy Samples given in case she finds it cost prohibitive we can pursue other avenues of coverage and she doesn't miss  medication Discussed the side-effects with Vesicare.  Discussed and recommends PTNS therapy which is done once a week for 12 weeks and a monthly mainenance after that.  Myrbetriq samples are to be given to pt in case PTNS therapy is not economically favorable RTC in three months for OAB questionnaire and PVR   2. Vaginal atrophy Could not tolerate cream  3.  Interstitial cystitis It may be likely that patient has a component of interstitial cystitis since she is been experiencing the symptomatology for most of her life We will address the bothersome symptoms of urgency and urge incontinence, patient has had cystoscopies in the past and stated that they were painful and will not undergo another cystoscopic exam unless she was under general anesthesia If conservative therapies and medications did not address her symptoms it may be something we will need to consider in the future   Return for PTNS.  These notes generated with voice recognition software. I apologize for typographical errors.  Zara Council, PA-C  [facility] [address] [place], [place] [address] [phone]  I, Lucas Mallow, am acting as a Education administrator for [employer]  I have reviewed the above documentation for accuracy and completeness, and I agree with the above.    Zara Council, PA-C

## 2018-06-24 ENCOUNTER — Ambulatory Visit: Payer: Medicare HMO | Admitting: Urology

## 2018-06-24 ENCOUNTER — Other Ambulatory Visit: Payer: Self-pay | Admitting: Urology

## 2018-06-24 ENCOUNTER — Encounter: Payer: Self-pay | Admitting: Urology

## 2018-06-24 VITALS — BP 148/79 | HR 67 | Ht 59.0 in | Wt 153.2 lb

## 2018-06-24 DIAGNOSIS — N3946 Mixed incontinence: Secondary | ICD-10-CM | POA: Diagnosis not present

## 2018-06-24 LAB — BLADDER SCAN AMB NON-IMAGING: Scan Result: 0

## 2018-07-20 DIAGNOSIS — Z5189 Encounter for other specified aftercare: Secondary | ICD-10-CM | POA: Insufficient documentation

## 2018-08-23 ENCOUNTER — Telehealth: Payer: Self-pay | Admitting: Urology

## 2018-08-23 NOTE — Telephone Encounter (Signed)
Called patient to schedule PTNS and she is not sure now if she wants to proceed. She will talk it over with her husband and call back.   [name] [name]

## 2018-09-05 NOTE — Progress Notes (Signed)
06/24/2018 9:19 AM   [name] [name] 1941/04/11 [id number]  Referring provider: Kirk Ruths, MD [facility] [place], [place] [address]  Chief Complaint  Patient presents with  . Follow-up    Discuss PTNS with spouse     HPI: Patient is a 78 year old Caucasian female with mixed incontinence, vaginal atrophy and interstitial cystitis who presents today with her husband, Kyung Rudd.    Mixed incontinence Referred to [place] by Dr. Kirk Ruths for urinary incontinence.  She states that she always had issues with her bladder.  As a child, she had incontinent episodes and was placed on "green medicine" that tasted awful.  She was also a bed wetting until the age of 61.  She also had to wear pads as a teenager.  From the age of 19 until the few years, she managed her urge incontinence without accidents.  She stated she has noticed a worsening of her urinary symptoms over the two years.  Patient has incontinence with urgency and SUI.  She was experiencing a lot incontinent episodes during the day. She is experiencing occasional incontinent episodes during the night.  Her incontinence volume is large.     Baseline symptoms  Wearing 7 to 8 pads/depends daily.   She has had several cystoscopies with Dr. Madelin Headings.  She was not sexually active.  She was post menopausal.  She admits to constipation and/or diarrhea.   She is drinking 24 ounces of water daily.   She has a cappuccino daily.  She drinks one bottle of diet Pepsi.  She does not drink juices.  She does not drink tea.  She does not drink alcohol.  She is not a smoker.   She was tried on Vesicare 5 mg daily, but she states it made her dizzy.  Myrbetriq gave 95% improvement.   patient has been experiencing urgency x 4-7, frequency x 8 or more, not restricting fluids to avoid visits to the restroom, is engaging in toilet mapping, incontinence x 0-3 and nocturia x 0-3.   PVR is 0 mL.  Her BP is 146/63.   Patient denies any gross hematuria, dysuria or suprapubic/flank pain.  Patient denies any fevers, chills, nausea or vomiting.  She has noticed a 95% improvement in her urinary symptoms at that time.  Three months later, she was asking for a cheaper option for the Myrbetriq.  PTNS was offered at that time.    Vaginal atrophy She stated she couldn't use the vaginal cream as it made her "sore down there."    Interstitial cystitis See above   PMH: Past Medical History:  Diagnosis Date  . Anemia   . Arthritis   . Cancer ([place])    melanoma  . Chronic kidney disease    stage 3  . Complication of anesthesia    sometimes hard to wake up  . Diabetes ([place])    diet controlled  . 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 GERD (gastroesophageal reflux disease)   . Headache   . Hyperlipemia   . Hypertension   . Pain    chronic  . Rosacea   . 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 Sleep apnea    cpap    Surgical History: Past Surgical History:  Procedure Laterality Date  . ABDOMINAL HYSTERECTOMY    . BACK SURGERY     lumbar fusion, laminectomy  .  BLEPHAROPLASTY Bilateral 10/24/14   Dr. Loni Muse. Vickki Muff, MBSC  . BREAST SURGERY     reduction  . CHOLECYSTECTOMY    . COLONOSCOPY WITH PROPOFOL N/A 03/17/2016   Procedure: COLONOSCOPY WITH PROPOFOL;  Surgeon: Manya Silvas, MD;  Location: [facility] ENDOSCOPY;  Service: Endoscopy;  Laterality: N/A;  . rcr    . REDUCTION MAMMAPLASTY Bilateral 1985  . SHOULDER ARTHROSCOPY WITH OPEN ROTATOR CUFF REPAIR Right 06/22/2015   Procedure: SHOULDER ARTHROSCOPY WITH SUBACROMIAL DECOMPRESSION, RELEASE OF LONG HEAD OF BICEPS TENDON, RESECTION OF DISTAL CLAVICLE;  Surgeon: Leanor Kail, MD;  Location: [facility];  Service: Orthopedics;  Laterality: Right;  DIABETIC - diet controlled CPAP   . SKIN CANCER EXCISION     leg, back    Home Medications:  Allergies as of 09/06/2018      Reactions   Contrast Media  [iodinated Diagnostic Agents] Other (See Comments)   Made hands and feet "draw up"   Tramadol Other (See Comments)   Other Reaction: OTHER REACTION, SOB, AGITATIO   Adhesive [tape] Rash   Some bandaids   Penicillins Rash, Other (See Comments)   Has patient had a PCN reaction causing immediate rash, facial/tongue/throat swelling, SOB or lightheadedness with hypotension: Yes Has patient had a PCN reaction causing severe rash involving mucus membranes or skin necrosis: No Has patient had a PCN reaction that required hospitalization No Has patient had a PCN reaction occurring within the last 10 years: No If all of the above answers are "NO", then may proceed with Cephalosporin use.      Medication List       Accurate as of September 06, 2018  9:19 AM. Always use your most recent med list.        acetaminophen 500 MG tablet Commonly known as:  TYLENOL Take 500 mg by mouth every 8 (eight) hours as needed for mild pain.   aspirin EC 81 MG tablet Take 1 tablet (81 mg total) by mouth daily.   atorvastatin 80 MG tablet Commonly known as:  LIPITOR TAKE 1 TABVLET BY MOUTH EVERY DAY   BAYER MICROLET LANCETS lancets once daily. Use as instructed.dx e11.22 CONTOUR NEXT EZ TESTER   carvedilol 12.5 MG tablet Commonly known as:  COREG Take 12.5 mg by mouth 2 (two) times daily with a meal.   conjugated estrogens vaginal cream Commonly known as:  PREMARIN Apply 0.5mg  (pea-sized amount)  just inside the vaginal introitus with a finger-tip on  Monday, Wednesday and Friday nights.   CONTOUR NEXT TEST test strip Generic drug:  glucose blood Use once daily. Use as instructed.dx e11.9   doxycycline 100 MG tablet Commonly known as:  VIBRA-TABS TAKE 1 TABLET EVERY DAY TO EVERY OTHER DAY AS DIRECTED   HYZAAR 100-25 MG tablet Generic drug:  losartan-hydrochlorothiazide Take 1 tablet by mouth daily.   levETIRAcetam 500 MG tablet Commonly known as:  KEPPRA levetiracetam 500 mg tablet     losartan 100 MG tablet Commonly known as:  COZAAR Take 100 mg by mouth daily.   mirabegron ER 50 MG Tb24 tablet Commonly known as:  MYRBETRIQ Take 1 tablet (50 mg total) by mouth daily.   PRILOSEC 40 MG capsule Generic drug:  omeprazole Take 40 mg by mouth daily.   risperiDONE 0.5 MG tablet Commonly known as:  RISPERDAL Take 0.5 mg by mouth at bedtime.       Allergies:  Allergies  Allergen Reactions  . Contrast Media [Iodinated Diagnostic Agents] Other (See Comments)    Made  hands and feet "draw up"  . Tramadol Other (See Comments)    Other Reaction: OTHER REACTION, SOB, AGITATIO  . Adhesive [Tape] Rash    Some bandaids  . Penicillins Rash and Other (See Comments)    Has patient had a PCN reaction causing immediate rash, facial/tongue/throat swelling, SOB or lightheadedness with hypotension: Yes Has patient had a PCN reaction causing severe rash involving mucus membranes or skin necrosis: No Has patient had a PCN reaction that required hospitalization No Has patient had a PCN reaction occurring within the last 10 years: No If all of the above answers are "NO", then may proceed with Cephalosporin use.     Family History: Family History  Problem Relation Age of Onset  . Aneurysm Father   . Hypertension Father   . Breast cancer Neg Hx     Social History:  reports that she has never smoked. She has never used smokeless tobacco. She reports that she does not drink alcohol or use drugs.  ROS: UROLOGY Frequent Urination?: No Hard to postpone urination?: No Burning/pain with urination?: No Get up at night to urinate?: No Leakage of urine?: No Urine stream starts and stops?: No Trouble starting stream?: No Do you have to strain to urinate?: No Blood in urine?: No Urinary tract infection?: No Sexually transmitted disease?: No Injury to kidneys or bladder?: No Painful intercourse?: No Weak stream?: No Currently pregnant?: No Vaginal bleeding?: No Last menstrual  period?: n  Gastrointestinal Nausea?: No Vomiting?: No Indigestion/heartburn?: No Diarrhea?: No Constipation?: No  Constitutional Fever: No Night sweats?: No Weight loss?: No Fatigue?: No  Skin Skin rash/lesions?: No Itching?: No  Eyes Blurred vision?: No Double vision?: No  Ears/Nose/Throat Sore throat?: No Sinus problems?: No  Hematologic/Lymphatic Swollen glands?: No Easy bruising?: No  Cardiovascular Leg swelling?: No Chest pain?: No  Respiratory Cough?: No Shortness of breath?: No  Endocrine Excessive thirst?: No  Musculoskeletal Back pain?: No Joint pain?: No  Neurological Headaches?: No Dizziness?: No  Psychologic Depression?: No Anxiety?: No  Physical Exam: There were no vitals taken for this visit.  Constitutional:  Well nourished. Alert and oriented, No acute distress.  Laboratory Data: Lab Results  Component Value Date   WBC 10.4 08/23/2016   HGB 11.9 (L) 08/23/2016   HCT 34.9 (L) 08/23/2016   MCV 89.3 08/23/2016   PLT 219 08/23/2016    Lab Results  Component Value Date   CREATININE 0.74 08/23/2016    No results found for: PSA  No results found for: TESTOSTERONE  Lab Results  Component Value Date   HGBA1C 6.9 (H) 08/23/2016    No results found for: TSH     Component Value Date/Time   CHOL 139 08/23/2016 0305   HDL 45 08/23/2016 0305   CHOLHDL 3.1 08/23/2016 0305   VLDL 13 08/23/2016 0305   LDLCALC 81 08/23/2016 0305    Lab Results  Component Value Date   AST 31 08/22/2016   Lab Results  Component Value Date   ALT 27 08/22/2016   No components found for: ALKALINEPHOPHATASE No components found for: BILIRUBINTOTAL  No results found for: ESTRADIOL  Urinalysis    Component Value Date/Time   COLORURINE STRAW (A) 08/22/2016 2022   APPEARANCEUR CLEAR (A) 08/22/2016 2022   LABSPEC 1.004 (L) 08/22/2016 2022   PHURINE 6.0 08/22/2016 2022   GLUCOSEU NEGATIVE 08/22/2016 2022   HGBUR SMALL (A) 08/22/2016  2022   BILIRUBINUR NEGATIVE 08/22/2016 2022   KETONESUR NEGATIVE 08/22/2016 2022   PROTEINUR NEGATIVE  08/22/2016 2022   NITRITE NEGATIVE 08/22/2016 2022   LEUKOCYTESUR NEGATIVE 08/22/2016 2022    I have reviewed the labs.    Assessment & Plan:    1. Mixed incontinence Continue Myrbetriq 50 mg daily Script sent to pharmacy Samples given in case she finds it cost prohibitive we can pursue other avenues of coverage and she doesn't miss medication Discussed the side-effects with Vesicare.  Patient and her husband have decided that taking the Myrbetriq is more cost effective than starting PTNS - I did not charge for this visit as basically this could have been answered over the phone  2. Vaginal atrophy Could not tolerate cream  3.  Interstitial cystitis It may be likely that patient has a component of interstitial cystitis since she is been experiencing the symptomatology for most of her life We will address the bothersome symptoms of urgency and urge incontinence, patient has had cystoscopies in the past and stated that they were painful and will not undergo another cystoscopic exam unless she was under general anesthesia If conservative therapies and medications did not address her symptoms it may be something we will need to consider in the future   Return in about 1 year (around 09/07/2019) for PVR and OAB questionnaire.  These notes generated with voice recognition software. I apologize for typographical errors.  Zara Council, PA-C  [facility] [address] [place], [place] [address] [phone]

## 2018-09-06 ENCOUNTER — Ambulatory Visit (INDEPENDENT_AMBULATORY_CARE_PROVIDER_SITE_OTHER): Payer: Medicare HMO | Admitting: Urology

## 2018-09-06 DIAGNOSIS — N3010 Interstitial cystitis (chronic) without hematuria: Secondary | ICD-10-CM

## 2018-09-06 DIAGNOSIS — N3946 Mixed incontinence: Secondary | ICD-10-CM

## 2018-09-06 DIAGNOSIS — N952 Postmenopausal atrophic vaginitis: Secondary | ICD-10-CM

## 2018-09-09 ENCOUNTER — Ambulatory Visit: Payer: Medicare HMO

## 2018-09-16 ENCOUNTER — Ambulatory Visit: Payer: Medicare HMO

## 2018-09-23 ENCOUNTER — Ambulatory Visit: Payer: Medicare HMO

## 2018-09-30 ENCOUNTER — Ambulatory Visit: Payer: Medicare HMO

## 2018-10-07 ENCOUNTER — Ambulatory Visit: Payer: Medicare HMO

## 2018-10-14 ENCOUNTER — Ambulatory Visit: Payer: Medicare HMO

## 2018-10-21 ENCOUNTER — Ambulatory Visit: Payer: Medicare HMO

## 2018-10-28 ENCOUNTER — Ambulatory Visit: Payer: Medicare HMO

## 2018-11-04 ENCOUNTER — Ambulatory Visit: Payer: Medicare HMO

## 2018-11-11 ENCOUNTER — Ambulatory Visit: Payer: Medicare HMO

## 2018-11-18 ENCOUNTER — Ambulatory Visit: Payer: Medicare HMO

## 2018-11-25 ENCOUNTER — Ambulatory Visit: Payer: Medicare HMO

## 2018-12-02 ENCOUNTER — Ambulatory Visit: Payer: Medicare HMO

## 2018-12-17 ENCOUNTER — Other Ambulatory Visit: Payer: Self-pay

## 2018-12-17 ENCOUNTER — Observation Stay
Admission: EM | Admit: 2018-12-17 | Discharge: 2018-12-19 | Disposition: A | Discharge status: Home / Self Care | Payer: Medicare HMO | Attending: Family Medicine | Admitting: Family Medicine

## 2018-12-17 ENCOUNTER — Emergency Department: Payer: Medicare HMO

## 2018-12-17 DIAGNOSIS — R569 Unspecified convulsions: Secondary | ICD-10-CM | POA: Insufficient documentation

## 2018-12-17 DIAGNOSIS — I700 Atherosclerosis of aorta: Secondary | ICD-10-CM | POA: Diagnosis not present

## 2018-12-17 DIAGNOSIS — E785 Hyperlipidemia, unspecified: Secondary | ICD-10-CM | POA: Insufficient documentation

## 2018-12-17 DIAGNOSIS — E1122 Type 2 diabetes mellitus with diabetic chronic kidney disease: Secondary | ICD-10-CM | POA: Diagnosis not present

## 2018-12-17 DIAGNOSIS — K219 Gastro-esophageal reflux disease without esophagitis: Secondary | ICD-10-CM | POA: Diagnosis not present

## 2018-12-17 DIAGNOSIS — D509 Iron deficiency anemia, unspecified: Secondary | ICD-10-CM | POA: Insufficient documentation

## 2018-12-17 DIAGNOSIS — Z85820 Personal history of malignant melanoma of skin: Secondary | ICD-10-CM | POA: Diagnosis not present

## 2018-12-17 DIAGNOSIS — Z7982 Long term (current) use of aspirin: Secondary | ICD-10-CM | POA: Insufficient documentation

## 2018-12-17 DIAGNOSIS — Z79899 Other long term (current) drug therapy: Secondary | ICD-10-CM | POA: Insufficient documentation

## 2018-12-17 DIAGNOSIS — E876 Hypokalemia: Secondary | ICD-10-CM | POA: Diagnosis present

## 2018-12-17 DIAGNOSIS — Z20828 Contact with and (suspected) exposure to other viral communicable diseases: Secondary | ICD-10-CM | POA: Insufficient documentation

## 2018-12-17 DIAGNOSIS — L719 Rosacea, unspecified: Secondary | ICD-10-CM | POA: Insufficient documentation

## 2018-12-17 DIAGNOSIS — N179 Acute kidney failure, unspecified: Secondary | ICD-10-CM | POA: Diagnosis not present

## 2018-12-17 DIAGNOSIS — Z8249 Family history of ischemic heart disease and other diseases of the circulatory system: Secondary | ICD-10-CM | POA: Diagnosis not present

## 2018-12-17 DIAGNOSIS — G4700 Insomnia, unspecified: Secondary | ICD-10-CM | POA: Diagnosis not present

## 2018-12-17 DIAGNOSIS — N183 Chronic kidney disease, stage 3 (moderate): Secondary | ICD-10-CM | POA: Insufficient documentation

## 2018-12-17 DIAGNOSIS — G8929 Other chronic pain: Secondary | ICD-10-CM | POA: Diagnosis not present

## 2018-12-17 DIAGNOSIS — R197 Diarrhea, unspecified: Secondary | ICD-10-CM

## 2018-12-17 DIAGNOSIS — I10 Essential (primary) hypertension: Secondary | ICD-10-CM

## 2018-12-17 DIAGNOSIS — R079 Chest pain, unspecified: Secondary | ICD-10-CM | POA: Diagnosis present

## 2018-12-17 DIAGNOSIS — G4733 Obstructive sleep apnea (adult) (pediatric): Secondary | ICD-10-CM | POA: Diagnosis not present

## 2018-12-17 DIAGNOSIS — A084 Viral intestinal infection, unspecified: Secondary | ICD-10-CM

## 2018-12-17 DIAGNOSIS — I129 Hypertensive chronic kidney disease with stage 1 through stage 4 chronic kidney disease, or unspecified chronic kidney disease: Secondary | ICD-10-CM | POA: Diagnosis not present

## 2018-12-17 LAB — CBC WITH DIFFERENTIAL/PLATELET
Abs Immature Granulocytes: 0.03 10*3/uL (ref 0.00–0.07)
Basophils Absolute: 0 10*3/uL (ref 0.0–0.1)
Basophils Relative: 1 %
Eosinophils Absolute: 0.2 10*3/uL (ref 0.0–0.5)
Eosinophils Relative: 2 %
HCT: 28.1 % — ABNORMAL LOW (ref 36.0–46.0)
Hemoglobin: 9.2 g/dL — ABNORMAL LOW (ref 12.0–15.0)
Immature Granulocytes: 0 %
Lymphocytes Relative: 15 %
Lymphs Abs: 1 10*3/uL (ref 0.7–4.0)
MCH: 30.1 pg (ref 26.0–34.0)
MCHC: 32.7 g/dL (ref 30.0–36.0)
MCV: 91.8 fL (ref 80.0–100.0)
Monocytes Absolute: 0.5 10*3/uL (ref 0.1–1.0)
Monocytes Relative: 8 %
Neutro Abs: 5 10*3/uL (ref 1.7–7.7)
Neutrophils Relative %: 74 %
Platelets: 231 10*3/uL (ref 150–400)
RBC: 3.06 MIL/uL — ABNORMAL LOW (ref 3.87–5.11)
RDW: 14 % (ref 11.5–15.5)
WBC: 6.7 10*3/uL (ref 4.0–10.5)
nRBC: 0 % (ref 0.0–0.2)

## 2018-12-17 LAB — GLUCOSE, CAPILLARY: Glucose-Capillary: 141 mg/dL — ABNORMAL HIGH (ref 70–99)

## 2018-12-17 LAB — COMPREHENSIVE METABOLIC PANEL
ALT: 29 U/L (ref 0–44)
AST: 35 U/L (ref 15–41)
Albumin: 3.1 g/dL — ABNORMAL LOW (ref 3.5–5.0)
Alkaline Phosphatase: 96 U/L (ref 38–126)
Anion gap: 16 — ABNORMAL HIGH (ref 5–15)
BUN: 29 mg/dL — ABNORMAL HIGH (ref 8–23)
CO2: 24 mmol/L (ref 22–32)
Calcium: 5.3 mg/dL — CL (ref 8.9–10.3)
Chloride: 101 mmol/L (ref 98–111)
Creatinine, Ser: 2.21 mg/dL — ABNORMAL HIGH (ref 0.44–1.00)
GFR calc Af Amer: 24 mL/min — ABNORMAL LOW (ref >60)
GFR calc non Af Amer: 21 mL/min — ABNORMAL LOW (ref >60)
Glucose, Bld: 119 mg/dL — ABNORMAL HIGH (ref 70–99)
Potassium: 2.3 mmol/L — CL (ref 3.5–5.1)
Sodium: 141 mmol/L (ref 135–145)
Total Bilirubin: 0.8 mg/dL (ref 0.3–1.2)
Total Protein: 6.1 g/dL — ABNORMAL LOW (ref 6.5–8.1)

## 2018-12-17 LAB — MAGNESIUM: Magnesium: 0.9 mg/dL — CL (ref 1.7–2.4)

## 2018-12-17 LAB — TROPONIN I
Troponin I: <0.03 ng/mL (ref <0.03)
Troponin I: <0.03 ng/mL (ref <0.03)

## 2018-12-17 LAB — ABO/RH: ABO/RH(D): O POS

## 2018-12-17 LAB — INFLUENZA PANEL BY PCR (TYPE A & B)
Influenza A By PCR: NEGATIVE
Influenza B By PCR: NEGATIVE

## 2018-12-17 LAB — POTASSIUM: Potassium: 2.6 mmol/L — CL (ref 3.5–5.1)

## 2018-12-17 LAB — PHOSPHORUS: Phosphorus: 5.5 mg/dL — ABNORMAL HIGH (ref 2.5–4.6)

## 2018-12-17 LAB — PROCALCITONIN: Procalcitonin: <0.1 ng/mL

## 2018-12-17 LAB — LACTIC ACID, PLASMA: Lactic Acid, Venous: 1.2 mmol/L (ref 0.5–1.9)

## 2018-12-17 LAB — SARS CORONAVIRUS 2 BY RT PCR (HOSPITAL ORDER, PERFORMED IN ~~LOC~~ HOSPITAL LAB): SARS Coronavirus 2: NEGATIVE

## 2018-12-17 MED ORDER — LEVETIRACETAM 500 MG PO TABS
500.0000 mg | ORAL_TABLET | Freq: Two times a day (BID) | ORAL | Status: DC
  Administered 2018-12-17 – 2018-12-19 (×4): 500 mg via ORAL
  Filled 2018-12-17 (×4): qty 1

## 2018-12-17 MED ORDER — ATORVASTATIN CALCIUM 20 MG PO TABS
80.0000 mg | ORAL_TABLET | Freq: Every day | ORAL | Status: DC
  Administered 2018-12-17 – 2018-12-18 (×2): 80 mg via ORAL
  Filled 2018-12-17 (×2): qty 4

## 2018-12-17 MED ORDER — MAGNESIUM SULFATE 2 GM/50ML IV SOLN
2.0000 g | Freq: Once | INTRAVENOUS | Status: AC
  Administered 2018-12-17: 2 g via INTRAVENOUS
  Filled 2018-12-17: qty 50

## 2018-12-17 MED ORDER — POTASSIUM CHLORIDE 10 MEQ/100ML IV SOLN
10.0000 meq | INTRAVENOUS | Status: DC
  Filled 2018-12-17 (×2): qty 100

## 2018-12-17 MED ORDER — CALCIUM GLUCONATE-NACL 1-0.675 GM/50ML-% IV SOLN
1.0000 g | Freq: Once | INTRAVENOUS | Status: AC
  Administered 2018-12-17: 1000 mg via INTRAVENOUS
  Filled 2018-12-17: qty 50

## 2018-12-17 MED ORDER — NITROGLYCERIN 0.4 MG SL SUBL: 0.4000 mg | SUBLINGUAL_TABLET | SUBLINGUAL | Status: DC | PRN

## 2018-12-17 MED ORDER — MIRABEGRON ER 50 MG PO TB24
50.0000 mg | ORAL_TABLET | Freq: Every day | ORAL | Status: DC
  Administered 2018-12-18 – 2018-12-19 (×2): 50 mg via ORAL
  Filled 2018-12-17 (×2): qty 1

## 2018-12-17 MED ORDER — SODIUM CHLORIDE 0.9 % IV SOLN
Freq: Once | INTRAVENOUS | Status: AC
  Administered 2018-12-17: 17:00:00 via INTRAVENOUS

## 2018-12-17 MED ORDER — ONDANSETRON HCL 4 MG/2ML IJ SOLN: 4.0000 mg | Freq: Four times a day (QID) | INTRAMUSCULAR | Status: DC | PRN

## 2018-12-17 MED ORDER — LIDOCAINE VISCOUS HCL 2 % MT SOLN
15.0000 mL | Freq: Four times a day (QID) | OROMUCOSAL | Status: DC | PRN
  Filled 2018-12-17: qty 15

## 2018-12-17 MED ORDER — ACETAMINOPHEN 325 MG PO TABS
650.0000 mg | ORAL_TABLET | ORAL | Status: DC | PRN
  Administered 2018-12-17 – 2018-12-18 (×2): 650 mg via ORAL
  Filled 2018-12-17 (×2): qty 2

## 2018-12-17 MED ORDER — CARVEDILOL 12.5 MG PO TABS
12.5000 mg | ORAL_TABLET | Freq: Two times a day (BID) | ORAL | Status: DC
  Administered 2018-12-17 – 2018-12-19 (×4): 12.5 mg via ORAL
  Filled 2018-12-17 (×4): qty 1

## 2018-12-17 MED ORDER — ALUM & MAG HYDROXIDE-SIMETH 200-200-20 MG/5ML PO SUSP: 30.0000 mL | Freq: Four times a day (QID) | ORAL | Status: DC | PRN

## 2018-12-17 MED ORDER — SODIUM CHLORIDE 0.9 % IV BOLUS
500.0000 mL | Freq: Once | INTRAVENOUS | Status: AC
  Administered 2018-12-17: 16:00:00 500 mL via INTRAVENOUS

## 2018-12-17 MED ORDER — SODIUM CHLORIDE 0.9 % IV SOLN
Freq: Once | INTRAVENOUS | Status: AC
  Administered 2018-12-17: 19:00:00 via INTRAVENOUS

## 2018-12-17 MED ORDER — PANTOPRAZOLE SODIUM 40 MG PO TBEC
80.0000 mg | DELAYED_RELEASE_TABLET | Freq: Every day | ORAL | Status: DC
  Administered 2018-12-18 – 2018-12-19 (×2): 80 mg via ORAL
  Filled 2018-12-17 (×2): qty 2

## 2018-12-17 MED ORDER — POTASSIUM CHLORIDE 10 MEQ/100ML IV SOLN
10.0000 meq | INTRAVENOUS | Status: DC
  Filled 2018-12-17 (×4): qty 100

## 2018-12-17 MED ORDER — PANTOPRAZOLE SODIUM 40 MG IV SOLR
40.0000 mg | Freq: Once | INTRAVENOUS | Status: AC
  Administered 2018-12-17: 17:00:00 40 mg via INTRAVENOUS
  Filled 2018-12-17: qty 40

## 2018-12-17 MED ORDER — ENOXAPARIN SODIUM 30 MG/0.3ML ~~LOC~~ SOLN
30.0000 mg | SUBCUTANEOUS | Status: DC
  Administered 2018-12-17 – 2018-12-18 (×2): 30 mg via SUBCUTANEOUS
  Filled 2018-12-17 (×2): qty 0.3

## 2018-12-17 MED ORDER — ASPIRIN EC 81 MG PO TBEC
81.0000 mg | DELAYED_RELEASE_TABLET | Freq: Every day | ORAL | Status: DC
  Administered 2018-12-18 – 2018-12-19 (×2): 81 mg via ORAL
  Filled 2018-12-17 (×2): qty 1

## 2018-12-17 MED ORDER — POTASSIUM CHLORIDE 10 MEQ/100ML IV SOLN
10.0000 meq | INTRAVENOUS | Status: AC
  Administered 2018-12-17 – 2018-12-18 (×4): 10 meq via INTRAVENOUS
  Filled 2018-12-17 (×4): qty 100

## 2018-12-17 MED ORDER — RISPERIDONE 0.5 MG PO TABS
0.5000 mg | ORAL_TABLET | Freq: Every day | ORAL | Status: DC
  Administered 2018-12-17 – 2018-12-18 (×2): 0.5 mg via ORAL
  Filled 2018-12-17 (×3): qty 1

## 2018-12-17 NOTE — ED Notes (Signed)
Not able to collect 2nd set of blood cultures after 2 unsuccessful attempts. Dr Quentin Cornwall notified and stated that the 2nd set would not be necessary.

## 2018-12-17 NOTE — Progress Notes (Signed)
Family Meeting Note  Advance Directive:no  Today a meeting took place with the Patient.  Patient is able to participate.  The following clinical team members were present during this meeting:MD  The following were discussed:Patient's diagnosis: chest pain, Patient's progosis: Unable to determine and Goals for treatment: Full Code  Additional follow-up to be provided: prn  Time spent during discussion:20 minutes  [name] Cardosa D Margart Zemanek, MD  

## 2018-12-17 NOTE — ED Notes (Addendum)
Date and time results received: 12/17/18 1510   Test: Ca Critical Value: 5.3  Name of Provider Notified: Dr Quentin Cornwall

## 2018-12-17 NOTE — ED Notes (Signed)
Pt unable to provide urine sample at this time. Pt provided with a cup of water with Dr Ruffin Frederick approval. Will try again in 30 minutes.

## 2018-12-17 NOTE — Consult Note (Signed)
Cardiology Consultation:   Patient ID: [name] [name] MRN: [id number]; DOB: 12/11/1940  Admit date: 12/17/2018 Date of Consult: 12/17/2018  Primary Care Provider: Kirk Ruths, MD Primary Cardiologist: new to [facility] Physician requesting consult: Dr. Brett Albino Reason for consult : angina chest pain  Patient Profile:   [name] [name] is a 78 y.o. female with a hx of chronic kidney disease, diet-controlled diabetes, hypertension, hyperlipidemia, partial seizures, sleep apnea on CPAP, Presenting to the emergency room with crushing chest pain on the left radiating down her left arm   History of Present Illness:   [name] [name] reported that her Symptoms developed while she was at the [place] office today, routine checkup for flu illness and nausea vomiting some nonbloody watery diarrhea general malaise, anorexia EMS was called for her symptoms Nitro given on route  Symptoms of anorexia, diarrhea started 4 days ago and have persisted with minimal p.o. fluid and food intake Also with Rigors, general malaise Went in to see Dr. Ouida Sills for further evaluation today Developed chest pain, referred to the emergency room  In the emergency room was given sublingual nitroglycerin x3, chewable aspirin Pain from 10/10 down to 4/10 after nitro  Potassium noted to be 2.3, sodium 141, creatinine 2.21 with BUN 29, initial troponin less than 0.03, normal LFTs, hemoglobin 9.2 magnesium 0.9 Calcium 5.3  Blood cultures drawn only 1 set Unable to get second set     Past Medical History:  Diagnosis Date  . Anemia   . Arthritis   . Cancer ([place])    melanoma  . Chronic kidney disease    stage 3  . Complication of anesthesia    sometimes hard to wake up  . Diabetes ([place])    diet controlled  . 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 GERD (gastroesophageal reflux disease)   . Headache   . Hyperlipemia   . Hypertension   . Pain    chronic  . Rosacea   . Seizures  ([place])    partial epilepsy - none 2 yrs  . Sleep apnea    cpap    Past Surgical History:  Procedure Laterality Date  . ABDOMINAL HYSTERECTOMY    . BACK SURGERY     lumbar fusion, laminectomy  . BLEPHAROPLASTY Bilateral 10/24/14   Dr. Loni Muse. Vickki Muff, MBSC  . BREAST SURGERY     reduction  . CHOLECYSTECTOMY    . COLONOSCOPY WITH PROPOFOL N/A 03/17/2016   Procedure: COLONOSCOPY WITH PROPOFOL;  Surgeon: Manya Silvas, MD;  Location: [facility] ENDOSCOPY;  Service: Endoscopy;  Laterality: N/A;  . rcr    . REDUCTION MAMMAPLASTY Bilateral 1985  . SHOULDER ARTHROSCOPY WITH OPEN ROTATOR CUFF REPAIR Right 06/22/2015   Procedure: SHOULDER ARTHROSCOPY WITH SUBACROMIAL DECOMPRESSION, RELEASE OF LONG HEAD OF BICEPS TENDON, RESECTION OF DISTAL CLAVICLE;  Surgeon: Leanor Kail, MD;  Location: [place];  Service: Orthopedics;  Laterality: Right;  DIABETIC - diet controlled CPAP   . SKIN CANCER EXCISION     leg, back     Home Medications:  Prior to Admission medications   Medication Sig Start Date End Date Taking? Authorizing Provider  aspirin EC 81 MG tablet Take 1 tablet (81 mg total) by mouth daily. 08/23/16  Yes Vaughan Basta, MD  atorvastatin (LIPITOR) 80 MG tablet TAKE 1 TABVLET BY MOUTH EVERY DAY 01/14/18  Yes [provider]  carvedilol (COREG) 12.5 MG tablet Take 12.5 mg by mouth 2 (two) times daily with a  meal.   Yes [provider]  doxycycline (VIBRA-TABS) 100 MG tablet 100 mg 2 (two) times daily.  02/05/18  Yes [provider]  levETIRAcetam (KEPPRA) 500 MG tablet Take 500 mg by mouth daily.  08/04/18  Yes [provider]  losartan (COZAAR) 100 MG tablet Take 100 mg by mouth daily. 08/10/18  Yes [provider]  mirabegron ER (MYRBETRIQ) 50 MG TB24 tablet Take 1 tablet (50 mg total) by mouth daily. 03/23/18  Yes McGowan, Larene Beach A, PA-C  omeprazole (PRILOSEC) 40 MG capsule Take 40 mg by mouth daily.  10/11/08  Yes [provider]  acetaminophen (TYLENOL) 500 MG tablet Take 500 mg by mouth every 8 (eight) hours as needed for mild pain.     [provider]  conjugated estrogens (PREMARIN) vaginal cream Apply 0.5mg  (pea-sized amount)  just inside the vaginal introitus with a finger-tip on  Monday, Wednesday and Friday nights. Patient not taking: Reported on 12/17/2018 02/25/18   Nori Riis, PA-C    Inpatient Medications: Scheduled Meds: . [START ON 12/18/2018] aspirin EC  81 mg Oral Daily  . atorvastatin  80 mg Oral q1800  . carvedilol  12.5 mg Oral BID WC  . enoxaparin (LOVENOX) injection  30 mg Subcutaneous Q24H  . levETIRAcetam  500 mg Oral BID  . [START ON 12/18/2018] mirabegron ER  50 mg Oral Daily  . [START ON 12/18/2018] pantoprazole  80 mg Oral Daily  . risperiDONE  0.5 mg Oral QHS   Continuous Infusions: . calcium gluconate    . magnesium sulfate bolus IVPB 2 g (12/17/18 1849)  . potassium chloride 10 mEq (12/17/18 1837)   PRN Meds: acetaminophen, alum & mag hydroxide-simeth **AND** lidocaine, nitroGLYCERIN, ondansetron (ZOFRAN) IV  Allergies:    Allergies  Allergen Reactions  . Contrast Media [Iodinated Diagnostic Agents] Other (See Comments)    Made hands and feet "draw up"  . Tramadol Other (See Comments)    Other Reaction: OTHER REACTION, SOB, AGITATIO  . Adhesive [Tape] Rash    Some bandaids  . Penicillins Rash and Other (See Comments)    Has patient had a PCN reaction causing immediate rash, facial/tongue/throat swelling, SOB or lightheadedness with hypotension: Yes Has patient had a PCN reaction causing severe rash involving mucus membranes or skin necrosis: No Has patient had a PCN reaction that required hospitalization No Has patient had a PCN reaction occurring within the last 10 years: No If all of the above answers are "NO", then may proceed with Cephalosporin use.     Social History:   Social History   Socioeconomic History  . Marital status: Married    Spouse  name: Not on file  . Number of children: Not on file  . Years of education: Not on file  . Highest education level: Not on file  Occupational History  . Not on file  Social Needs  . Financial resource strain: Not on file  . Food insecurity:    Worry: Not on file    Inability: Not on file  . Transportation needs:    Medical: Not on file    Non-medical: Not on file  Tobacco Use  . Smoking status: Never Smoker  . Smokeless tobacco: Never Used  Substance and Sexual Activity  . Alcohol use: No  . Drug use: No  . Sexual activity: Yes  Lifestyle  . Physical activity:    Days per week: Not on file    Minutes per session: Not on file  .  Stress: Not on file  Relationships  . Social connections:    Talks on phone: Not on file    Gets together: Not on file    Attends religious service: Not on file    Active member of club or organization: Not on file    Attends meetings of clubs or organizations: Not on file    Relationship status: Not on file  . Intimate partner violence:    Fear of current or ex partner: Not on file    Emotionally abused: Not on file    Physically abused: Not on file    Forced sexual activity: Not on file  Other Topics Concern  . Not on file  Social History Narrative  . Not on file    Family History:    Family History  Problem Relation Age of Onset  . Aneurysm Father   . Hypertension Father   . Breast cancer Neg Hx      ROS:  Please see the history of present illness.  Review of Systems  Constitutional: Positive for malaise/fatigue.       Anorexia  Respiratory: Negative.   Cardiovascular: Positive for chest pain.  Gastrointestinal: Positive for abdominal pain, diarrhea, nausea and vomiting.  Musculoskeletal: Negative.   Neurological: Negative.   Psychiatric/Behavioral: Negative.   All other systems reviewed and are negative.   Physical Exam/Data:   Vitals:   12/17/18 1637 12/17/18 1723 12/17/18 1808 12/17/18 1813  BP:    (!) 123/109   Pulse: (!) 59 63  70  Resp: 16 19  18   Temp:    97.8 F (36.6 C)  TempSrc:    Oral  SpO2: 100% 100%    Weight:   72.1 kg   Height:   4\' 11"  (1.499 m)    No intake or output data in the 24 hours ending 12/17/18 1850 Last 3 Weights 12/17/2018 12/17/2018 06/24/2018  Weight (lbs) 158 lb 14.4 oz 153 lb 153 lb 3.2 oz  Weight (kg) 72.077 kg 69.4 kg 69.491 kg     Body mass index is 32.09 kg/m.  General:  Well nourished, well developed, in no acute distress HEENT: normal Lymph: no adenopathy Neck: no JVD Endocrine:  No thryomegaly Vascular: No carotid bruits; FA pulses 2+ bilaterally without bruits  Cardiac:  normal S1, S2; RRR; no murmur  Lungs:  clear to auscultation bilaterally, no wheezing, rhonchi or rales  Abd: soft, nontender, no hepatomegaly  Ext: no edema Musculoskeletal:  No deformities, BUE and BLE strength normal and equal Skin: warm and dry  Neuro:  CNs 2-12 intact, no focal abnormalities noted Psych:  Normal affect   EKG:  The EKG was personally reviewed and demonstrates:  Normal sinus rhythm with rate 76 bpm nonspecific ST-T wave abnormality Telemetry:  Telemetry was personally reviewed and demonstrates: Normal sinus rhythm  Relevant CV Studies:   Laboratory Data:  Chemistry Recent Labs  Lab 12/17/18 1416  NA 141  K 2.3*  CL 101  CO2 24  GLUCOSE 119*  BUN 29*  CREATININE 2.21*  CALCIUM 5.3*  GFRNONAA 21*  GFRAA 24*  ANIONGAP 16*    Recent Labs  Lab 12/17/18 1416  PROT 6.1*  ALBUMIN 3.1*  AST 35  ALT 29  ALKPHOS 96  BILITOT 0.8   Hematology Recent Labs  Lab 12/17/18 1416  WBC 6.7  RBC 3.06*  HGB 9.2*  HCT 28.1*  MCV 91.8  MCH 30.1  MCHC 32.7  RDW 14.0  PLT 231   Cardiac Enzymes  Recent Labs  Lab 12/17/18 1416  TROPONINI <0.03   No results for input(s): TROPIPOC in the last 168 hours.  BNPNo results for input(s): BNP, PROBNP in the last 168 hours.  DDimer No results for input(s): DDIMER in the last 168 hours.  Radiology/Studies:   Dg Chest Port 1 View  Result Date: 12/17/2018 CLINICAL DATA:  Chest pain and weakness today. EXAM: PORTABLE CHEST 1 VIEW COMPARISON:  08/22/2016 FINDINGS: Borderline cardiomegaly. Mild thoracic aortic tortuosity. Poor inspiration. No evidence of heart failure or effusion. No consolidation, collapse or focal lesion. IMPRESSION: Poor inspiration. No active disease identified. Borderline cardiomegaly and aortic atherosclerosis. Electronically Signed   By: Nelson Chimes M.D.   On: 12/17/2018 14:04    Assessment and Plan:   Chest pain-  EKG benign, cardiac enzymes negative x2 Echocardiogram pending Suspect atypical chest pain in the setting of profound electrolyte abnormality, viral gastroenteritis -Currently with no plans for ischemic work-up given atypical presentation  Nausea/vomiting/diarrhea-  likely viral gastroenteritis.  COVID negative.   4 days of diarrhea with profoundly depressed potassium 2.3, magnesium 0.9, calcium on arrival -Electrolytes are being actively repleted Sitting up having dinner this evening  AKI- Likely from d ehydration in the setting of diarrhea and anorexia over the past 4 days -Continue IV fluids Agree with holding losartan and HCTZ  Hypokalemia- Also with low magnesium  likely due to diarrhea Actively being repleted  Hypocalcemia- She has received a dose of IV calcium gluconate Secondary to diarrhea, anorexia  History of seizures- no recent seizure-like activity -Continue home Keppra   Total encounter time more than 110 minutes  Greater than 50% was spent in counseling and coordination of care with the patient  For questions or updates, please contact [place] Please consult [email or web address] for contact info under     Signed, Ida Rogue, MD  12/17/2018 6:50 PM

## 2018-12-17 NOTE — H&P (Addendum)
[facility] [place] NAME: [name] [name]    MR#:  [id number]  DATE OF BIRTH:  Mar 20, 1941  DATE OF ADMISSION:  12/17/2018  PRIMARY CARE PHYSICIAN: Kirk Ruths, MD   REQUESTING/REFERRING PHYSICIAN: Merlyn Lot, MD  CHIEF COMPLAINT:   Chief Complaint  Patient presents with  . Chest Pain    HISTORY OF PRESENT ILLNESS:  [name] [name]  is a 78 y.o. female with a known history of type 2 diabetes, hyperlipidemia, CKD 3, OSA, history of seizures, chronic pain who presented to the ED with chest tightness.  She has had nausea, vomiting, and diarrhea for the last couple days.  The vomiting and diarrhea stopped spontaneously 2 days ago.  She still was not feeling well, so she called her PCPs office and was tested for strep, flu, and COVID.  She has not received the results of these tests yet.  After seeing her PCP, she came home and was sitting at her kitchen table reading the paper, when she suddenly developed chills and chest tightness.  She also developed some pain that radiated down her left arm.  She had some associated diaphoresis.  Her husband called 41.  While in route to the ED, she received nitroglycerin which resolved her chest tightness.  In the ED, vitals were unremarkable.  Labs were significant for K2.3, creatinine 2.21, calcium 5.3, hemoglobin 9.2.  COVID testing was negative.  Chest x-ray without acute findings.  Initial troponin was negative.  Hospitalists were called for admission.  PAST MEDICAL HISTORY:   Past Medical History:  Diagnosis Date  . Anemia   . Arthritis   . Cancer ([place])    melanoma  . Chronic kidney disease    stage 3  . Complication of anesthesia    sometimes hard to wake up  . Diabetes ([place])    diet controlled  . 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 GERD (gastroesophageal reflux disease)   . Headache   . Hyperlipemia   . Hypertension   . Pain    chronic  . Rosacea    . Seizures ([place])    partial epilepsy - none 2 yrs  . Sleep apnea    cpap    PAST SURGICAL HISTORY:   Past Surgical History:  Procedure Laterality Date  . ABDOMINAL HYSTERECTOMY    . BACK SURGERY     lumbar fusion, laminectomy  . BLEPHAROPLASTY Bilateral 10/24/14   Dr. Loni Muse. Vickki Muff, MBSC  . BREAST SURGERY     reduction  . CHOLECYSTECTOMY    . COLONOSCOPY WITH PROPOFOL N/A 03/17/2016   Procedure: COLONOSCOPY WITH PROPOFOL;  Surgeon: Manya Silvas, MD;  Location: [facility] ENDOSCOPY;  Service: Endoscopy;  Laterality: N/A;  . rcr    . REDUCTION MAMMAPLASTY Bilateral 1985  . SHOULDER ARTHROSCOPY WITH OPEN ROTATOR CUFF REPAIR Right 06/22/2015   Procedure: SHOULDER ARTHROSCOPY WITH SUBACROMIAL DECOMPRESSION, RELEASE OF LONG HEAD OF BICEPS TENDON, RESECTION OF DISTAL CLAVICLE;  Surgeon: Leanor Kail, MD;  Location: [facility];  Service: Orthopedics;  Laterality: Right;  DIABETIC - diet controlled CPAP   . SKIN CANCER EXCISION     leg, back    SOCIAL HISTORY:   Social History   Tobacco Use  . Smoking status: Never Smoker  . Smokeless tobacco: Never Used  Substance Use Topics  . Alcohol use: No    FAMILY HISTORY:   Family History  Problem Relation Age of Onset  .  Aneurysm Father   . Hypertension Father   . Breast cancer Neg Hx     DRUG ALLERGIES:   Allergies  Allergen Reactions  . Contrast Media [Iodinated Diagnostic Agents] Other (See Comments)    Made hands and feet "draw up"  . Tramadol Other (See Comments)    Other Reaction: OTHER REACTION, SOB, AGITATIO  . Adhesive [Tape] Rash    Some bandaids  . Penicillins Rash and Other (See Comments)    Has patient had a PCN reaction causing immediate rash, facial/tongue/throat swelling, SOB or lightheadedness with hypotension: Yes Has patient had a PCN reaction causing severe rash involving mucus membranes or skin necrosis: No Has patient had a PCN reaction that required hospitalization No Has patient had a PCN  reaction occurring within the last 10 years: No If all of the above answers are "NO", then may proceed with Cephalosporin use.     REVIEW OF SYSTEMS:   Review of Systems  Constitutional: Positive for chills and diaphoresis. Negative for fever.  HENT: Negative for congestion and sore throat.   Eyes: Negative for blurred vision and double vision.  Respiratory: Negative for cough and shortness of breath.   Cardiovascular: Positive for chest pain. Negative for palpitations and leg swelling.  Gastrointestinal: Positive for diarrhea, nausea and vomiting. Negative for blood in stool and melena.  Genitourinary: Negative for dysuria and urgency.  Musculoskeletal: Negative for back pain and neck pain.  Neurological: Negative for dizziness and headaches.  Psychiatric/Behavioral: Negative for depression. The patient is not nervous/anxious.     MEDICATIONS AT HOME:   Prior to Admission medications   Medication Sig Start Date End Date Taking? Authorizing Provider  acetaminophen (TYLENOL) 500 MG tablet Take 500 mg by mouth every 8 (eight) hours as needed for mild pain.     [provider]  aspirin EC 81 MG tablet Take 1 tablet (81 mg total) by mouth daily. 08/23/16   Vaughan Basta, MD  atorvastatin (LIPITOR) 80 MG tablet TAKE 1 TABVLET BY MOUTH EVERY DAY 01/14/18   [provider]  BAYER MICROLET LANCETS lancets once daily. Use as instructed.dx e11.22 CONTOUR NEXT EZ TESTER 10/22/16   [provider]  carvedilol (COREG) 12.5 MG tablet Take 12.5 mg by mouth 2 (two) times daily with a meal.    [provider]  conjugated estrogens (PREMARIN) vaginal cream Apply 0.5mg  (pea-sized amount)  just inside the vaginal introitus with a finger-tip on  Monday, Wednesday and Friday nights. 02/25/18   McGowan, Larene Beach A, PA-C  doxycycline (VIBRA-TABS) 100 MG tablet TAKE 1 TABLET EVERY DAY TO EVERY OTHER DAY AS DIRECTED 02/05/18   [provider]  glucose blood  (CONTOUR NEXT TEST) test strip Use once daily. Use as instructed.dx e11.9 05/26/14   [provider]  levETIRAcetam (KEPPRA) 500 MG tablet levetiracetam 500 mg tablet 08/04/18   [provider]  losartan (COZAAR) 100 MG tablet Take 100 mg by mouth daily. 08/10/18   [provider]  losartan-hydrochlorothiazide (HYZAAR) 100-25 MG tablet Take 1 tablet by mouth daily.  03/28/09   [provider]  mirabegron ER (MYRBETRIQ) 50 MG TB24 tablet Take 1 tablet (50 mg total) by mouth daily. 03/23/18   Zara Council A, PA-C  omeprazole (PRILOSEC) 40 MG capsule Take 40 mg by mouth daily.  10/11/08   [provider]  risperiDONE (RISPERDAL) 0.5 MG tablet Take 0.5 mg by mouth at bedtime. 07/21/18   [provider]      VITAL SIGNS:  Blood  pressure 108/60, pulse 93, temperature 98.1 F (36.7 C), temperature source Oral, resp. rate (!) 23, height 4\' 11"  (1.499 m), weight 69.4 kg, SpO2 99 %.  PHYSICAL EXAMINATION:  Physical Exam  GENERAL:  78 y.o.-year-old patient lying in the bed with no acute distress.  EYES: Pupils equal, round, reactive to light and accommodation. No scleral icterus. Extraocular muscles intact.  HEENT: Head atraumatic, normocephalic. Oropharynx and nasopharynx clear. +dry mucous membranes. NECK:  Supple, no jugular venous distention. No thyroid enlargement, no tenderness.  LUNGS: Normal breath sounds bilaterally, no wheezing, rales,rhonchi or crepitation. No use of accessory muscles of respiration.  CARDIOVASCULAR: RRR, S1, S2 normal. No murmurs, rubs, or gallops.  ABDOMEN: Soft, nontender, nondistended. Bowel sounds present. No organomegaly or mass.  EXTREMITIES: No pedal edema, cyanosis, or clubbing.  NEUROLOGIC: Cranial nerves II through XII are intact. Muscle strength 5/5 in all extremities. Sensation intact. Gait not checked.  PSYCHIATRIC: The patient is alert and oriented x 3.  SKIN: No obvious rash, lesion, or ulcer.    LABORATORY PANEL:   CBC Recent Labs  Lab 12/17/18 1416  WBC 6.7  HGB 9.2*  HCT 28.1*  PLT 231   ------------------------------------------------------------------------------------------------------------------  Chemistries  Recent Labs  Lab 12/17/18 1416  NA 141  K 2.3*  CL 101  CO2 24  GLUCOSE 119*  BUN 29*  CREATININE 2.21*  CALCIUM 5.3*  AST 35  ALT 29  ALKPHOS 96  BILITOT 0.8   ------------------------------------------------------------------------------------------------------------------  Cardiac Enzymes Recent Labs  Lab 12/17/18 1416  TROPONINI <0.03   ------------------------------------------------------------------------------------------------------------------  RADIOLOGY:  Dg Chest Port 1 View  Result Date: 12/17/2018 CLINICAL DATA:  Chest pain and weakness today. EXAM: PORTABLE CHEST 1 VIEW COMPARISON:  08/22/2016 FINDINGS: Borderline cardiomegaly. Mild thoracic aortic tortuosity. Poor inspiration. No evidence of heart failure or effusion. No consolidation, collapse or focal lesion. IMPRESSION: Poor inspiration. No active disease identified. Borderline cardiomegaly and aortic atherosclerosis. Electronically Signed   By: Nelson Chimes M.D.   On: 12/17/2018 14:04      IMPRESSION AND PLAN:   Chest pain- concern for cardiac etiology, given that patient describes left-sided chest pain that radiates down her left arm with associated diaphoresis.  Chest pain resolved with SL nitro.  Has never had a cardiac work-up.  Initial troponin negative. -Trend troponins -Cardiology consult  -Continue aspirin, Coreg, Lipitor -SL nitro and GI cocktail prn -Check ECHO  Nausea/vomiting/diarrhea- likely viral gastroenteritis.  COVID negative.  Has already started improving. -GI pathogen panel and C. difficile pending -Check influenza -Continue home PPI  AKI- likely due to dehydration in the setting of vomiting and diarrhea. -Continue IV fluids -Avoid nephrotoxic  agents -Holding home losartan and HCTZ -Recheck creatinine in the morning  Hypokalemia- likely due to diarrhea -Replete and recheck -Check mag  Hypocalcemia- may be due to hypomagnesemia -Check magnesium level -Check PTH, vitamin D, phosphorus -Will give a dose of IV calcium gluconate  Microcytic anemia- hgb lower than baseline. No active bleeding. -Check anemia panel and FOBT  History of seizures- no recent seizure-like activity -Continue home Keppra  OSA-stable -CPAP nightly  All the records are reviewed and case discussed with ED provider. Management plans discussed with the patient, family and they are in agreement.  CODE STATUS: Full  TOTAL TIME TAKING CARE OF THIS PATIENT: 45 minutes.    Berna Spare Saima Monterroso M.D on 12/17/2018 at 3:43 PM  Between 7am to 6pm - Pager - [phone]  After 6pm go to [email or web address] - [facility]  [facility]  Office  [phone]  CC: Primary care physician; Kirk Ruths, MD   Note: This dictation was prepared with Dragon dictation along with smaller phrase technology. Any transcriptional errors that result from this process are unintentional.

## 2018-12-17 NOTE — ED Notes (Addendum)
ED TO INPATIENT HANDOFF REPORT  ED Nurse Name and Phone #:  Gershon Mussel [phone]  S Name/Age/Gender [name] [name] 78 y.o. female Room/Bed: ED11A/ED11A  Code Status   Code Status: Prior  Home/SNF/Other Home Patient oriented to: self, place, time and situation Is this baseline? Yes   Triage Complete: Triage complete  Chief Complaint cp  Triage Note Pt arrives to ED from home via [facility] with c/c of "crushing chest pain" on left side, radiating down left arm. EMS reports transport vitals: 116/64, p 81, NSR, O2 sat 98% on room air, temp 98.1 axillary, CBG 171. Pt given 3 sublingual nitro, 325mg  chewable aspirin, 135mL NS. 20G placed in left forearm. Pt reports initial 10/10 pain which reduced to 4/10 after nitro. Upon arrival, Pt A&Ox4, NAD, no respiratory distress evident.   Allergies Allergies  Allergen Reactions  . Contrast Media [Iodinated Diagnostic Agents] Other (See Comments)    Made hands and feet "draw up"  . Tramadol Other (See Comments)    Other Reaction: OTHER REACTION, SOB, AGITATIO  . Adhesive [Tape] Rash    Some bandaids  . Penicillins Rash and Other (See Comments)    Has patient had a PCN reaction causing immediate rash, facial/tongue/throat swelling, SOB or lightheadedness with hypotension: Yes Has patient had a PCN reaction causing severe rash involving mucus membranes or skin necrosis: No Has patient had a PCN reaction that required hospitalization No Has patient had a PCN reaction occurring within the last 10 years: No If all of the above answers are "NO", then may proceed with Cephalosporin use.     Level of Care/Admitting Diagnosis ED Disposition    ED Disposition Condition [facility] Area: [place] [[id number]]  Level of Care: Telemetry [5]  Covid Evaluation: N/A  Diagnosis: Chest pain [[id number]]  Admitting Physician: Hyman Bible DODD [[id number]]  Attending Physician: Hyman Bible DODD [[id number]]  PT Class (Do Not Modify):  Observation [104]  PT Acc Code (Do Not Modify): Observation [10022]       B Medical/Surgery History Past Medical History:  Diagnosis Date  . Anemia   . Arthritis   . Cancer ([place])    melanoma  . Chronic kidney disease    stage 3  . Complication of anesthesia    sometimes hard to wake up  . Diabetes ([place])    diet controlled  . 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 GERD (gastroesophageal reflux disease)   . Headache   . Hyperlipemia   . Hypertension   . Pain    chronic  . Rosacea   . 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 Sleep apnea    cpap   Past Surgical History:  Procedure Laterality Date  . ABDOMINAL HYSTERECTOMY    . BACK SURGERY     lumbar fusion, laminectomy  . BLEPHAROPLASTY Bilateral 10/24/14   Dr. Loni Muse. Vickki Muff, MBSC  . BREAST SURGERY     reduction  . CHOLECYSTECTOMY    . COLONOSCOPY WITH PROPOFOL N/A 03/17/2016   Procedure: COLONOSCOPY WITH PROPOFOL;  Surgeon: Manya Silvas, MD;  Location: [facility] ENDOSCOPY;  Service: Endoscopy;  Laterality: N/A;  . rcr    . REDUCTION MAMMAPLASTY Bilateral 1985  . SHOULDER ARTHROSCOPY WITH OPEN ROTATOR CUFF REPAIR Right 06/22/2015   Procedure: SHOULDER ARTHROSCOPY WITH SUBACROMIAL DECOMPRESSION, RELEASE OF LONG HEAD OF BICEPS TENDON, RESECTION OF DISTAL CLAVICLE;  Surgeon: Leanor Kail, MD;  Location: [facility];  Service: Orthopedics;  Laterality: Right;  DIABETIC - diet controlled CPAP   . SKIN CANCER EXCISION     leg, back     A IV Location/Drains/Wounds Patient Lines/Drains/Airways Status   Active Line/Drains/Airways    Name:   Placement date:   Placement time:   Site:   Days:   Peripheral IV 12/17/18 Right Antecubital   12/17/18    1420    Antecubital   less than 1   Peripheral IV 12/17/18 Left Forearm   12/17/18    -    Forearm   less than 1   Incision (Closed) 06/22/15 Shoulder Right   06/22/15    0830     1274          Intake/Output Last 24 hours No intake or  output data in the 24 hours ending 12/17/18 1725  Labs/Imaging Results for orders placed or performed during the hospital encounter of 12/17/18 (from the past 48 hour(s))  Lactic acid, plasma     Status: None   Collection Time: 12/17/18  2:15 PM  Result Value Ref Range   Lactic Acid, Venous 1.2 0.5 - 1.9 mmol/L    Comment: Performed at [facility], [address], [place], [place] [address]  Comprehensive metabolic panel     Status: Abnormal   Collection Time: 12/17/18  2:16 PM  Result Value Ref Range   Sodium 141 135 - 145 mmol/L   Potassium 2.3 (LL) 3.5 - 5.1 mmol/L    Comment: CRITICAL RESULT CALLED TO, READ BACK BY AND VERIFIED WITH TOM NAGY AT 1510 12/17/2018.PMF   Chloride 101 98 - 111 mmol/L   CO2 24 22 - 32 mmol/L   Glucose, Bld 119 (H) 70 - 99 mg/dL   BUN 29 (H) 8 - 23 mg/dL   Creatinine, Ser 2.21 (H) 0.44 - 1.00 mg/dL   Calcium 5.3 (LL) 8.9 - 10.3 mg/dL    Comment: CRITICAL RESULT CALLED TO, READ BACK BY AND VERIFIED WITH TOM NAGY AT 4818 12/17/2018.PMF   Total Protein 6.1 (L) 6.5 - 8.1 g/dL   Albumin 3.1 (L) 3.5 - 5.0 g/dL   AST 35 15 - 41 U/L   ALT 29 0 - 44 U/L   Alkaline Phosphatase 96 38 - 126 U/L   Total Bilirubin 0.8 0.3 - 1.2 mg/dL   GFR calc non Af Amer 21 (L) >60 mL/min   GFR calc Af Amer 24 (L) >60 mL/min   Anion gap 16 (H) 5 - 15    Comment: Performed at [facility], [address] [place], [place] [address]  CBC WITH DIFFERENTIAL     Status: Abnormal   Collection Time: 12/17/18  2:16 PM  Result Value Ref Range   WBC 6.7 4.0 - 10.5 K/uL   RBC 3.06 (L) 3.87 - 5.11 MIL/uL   Hemoglobin 9.2 (L) 12.0 - 15.0 g/dL   HCT 28.1 (L) 36.0 - 46.0 %   MCV 91.8 80.0 - 100.0 fL   MCH 30.1 26.0 - 34.0 pg   MCHC 32.7 30.0 - 36.0 g/dL   RDW 14.0 11.5 - 15.5 %   Platelets 231 150 - 400 K/uL   nRBC 0.0 0.0 - 0.2 %   Neutrophils Relative % 74 %   Neutro Abs 5.0 1.7 - 7.7 K/uL   Lymphocytes Relative 15 %   Lymphs Abs 1.0 0.7 - 4.0 K/uL   Monocytes  Relative 8 %   Monocytes Absolute 0.5 0.1 -  1.0 K/uL   Eosinophils Relative 2 %   Eosinophils Absolute 0.2 0.0 - 0.5 K/uL   Basophils Relative 1 %   Basophils Absolute 0.0 0.0 - 0.1 K/uL   Immature Granulocytes 0 %   Abs Immature Granulocytes 0.03 0.00 - 0.07 K/uL    Comment: Performed at [facility], [address] [place], [place] [address]  Procalcitonin     Status: None   Collection Time: 12/17/18  2:16 PM  Result Value Ref Range   Procalcitonin <0.10 ng/mL    Comment:        Interpretation: PCT (Procalcitonin) <= 0.5 ng/mL: Systemic infection (sepsis) is not likely. Local bacterial infection is possible. (NOTE)       Sepsis PCT Algorithm           Lower Respiratory Tract                                      Infection PCT Algorithm    ----------------------------     ----------------------------         PCT < 0.25 ng/mL                PCT < 0.10 ng/mL         Strongly encourage             Strongly discourage   discontinuation of antibiotics    initiation of antibiotics    ----------------------------     -----------------------------       PCT 0.25 - 0.50 ng/mL            PCT 0.10 - 0.25 ng/mL               OR       >80% decrease in PCT            Discourage initiation of                                            antibiotics      Encourage discontinuation           of antibiotics    ----------------------------     -----------------------------         PCT >= 0.50 ng/mL              PCT 0.26 - 0.50 ng/mL               AND        <80% decrease in PCT             Encourage initiation of                                             antibiotics       Encourage continuation           of antibiotics    ----------------------------     -----------------------------        PCT >= 0.50 ng/mL                  PCT > 0.50 ng/mL               AND  increase in PCT                  Strongly encourage                                      initiation of antibiotics     Strongly encourage escalation           of antibiotics                                     -----------------------------                                           PCT <= 0.25 ng/mL                                                 OR                                        > 80% decrease in PCT                                     Discontinue / Do not initiate                                             antibiotics Performed at [facility], [address] [place], [place] [address]   Troponin I - ONCE - STAT     Status: None   Collection Time: 12/17/18  2:16 PM  Result Value Ref Range   Troponin I <0.03 <0.03 ng/mL    Comment: Performed at [facility], [address] [place], [place] [address]  SARS Coronavirus 2 [facility] order, Performed in [facility] hospital lab)     Status: None   Collection Time: 12/17/18  2:17 PM  Result Value Ref Range   SARS Coronavirus 2 NEGATIVE NEGATIVE    Comment: (NOTE) If result is NEGATIVE SARS-CoV-2 target nucleic acids are NOT DETECTED. The SARS-CoV-2 RNA is generally detectable in upper and lower  respiratory specimens during the acute phase of infection. The lowest  concentration of SARS-CoV-2 viral copies this assay can detect is 250  copies / mL. A negative result does not preclude SARS-CoV-2 infection  and should not be used as the sole basis for treatment or other  patient management decisions.  A negative result may occur with  improper specimen collection / handling, submission of specimen other  than nasopharyngeal swab, presence of viral mutation(s) within the  areas targeted by this assay, and inadequate number of viral copies  (<250 copies / mL). A negative result must be combined with clinical  observations, patient history, and epidemiological information. If result is POSITIVE SARS-CoV-2 target nucleic acids are DETECTED. The SARS-CoV-2 RNA is generally detectable in upper and  lower  respiratory specimens  dur ing the acute phase of infection.  Positive  results are indicative of active infection with SARS-CoV-2.  Clinical  correlation with patient history and other diagnostic information is  necessary to determine patient infection status.  Positive results do  not rule out bacterial infection or co-infection with other viruses. If result is PRESUMPTIVE POSTIVE SARS-CoV-2 nucleic acids MAY BE PRESENT.   A presumptive positive result was obtained on the submitted specimen  and confirmed on repeat testing.  While 2019 novel coronavirus  (SARS-CoV-2) nucleic acids may be present in the submitted sample  additional confirmatory testing may be necessary for epidemiological  and / or clinical management purposes  to differentiate between  SARS-CoV-2 and other Sarbecovirus currently known to infect humans.  If clinically indicated additional testing with an alternate test  methodology [phone]) is advised. The SARS-CoV-2 RNA is generally  detectable in upper and lower respiratory sp ecimens during the acute  phase of infection. The expected result is Negative. Fact Sheet for Patients:  StrictlyIdeas.no Fact Sheet for Healthcare Providers: BankingDealers.co.za This test is not yet approved or cleared by the [place] FDA and has been authorized for detection and/or diagnosis of SARS-CoV-2 by FDA under an Emergency Use Authorization (EUA).  This EUA will remain in effect (meaning this test can be used) for the duration of the COVID-19 declaration under Section 564(b)(1) of the Act, 21 U.S.C. section 360bbb-3(b)(1), unless the authorization is terminated or revoked sooner. Performed at [facility], [address], [place], [place] [address]   ABO/Rh     Status: None   Collection Time: 12/17/18  2:17 PM  Result Value Ref Range   ABO/RH(D)      O POS Performed at [facility], [address] [place], [place] [address]     Dg Chest Port 1 View  Result Date: 12/17/2018 CLINICAL DATA:  Chest pain and weakness today. EXAM: PORTABLE CHEST 1 VIEW COMPARISON:  08/22/2016 FINDINGS: Borderline cardiomegaly. Mild thoracic aortic tortuosity. Poor inspiration. No evidence of heart failure or effusion. No consolidation, collapse or focal lesion. IMPRESSION: Poor inspiration. No active disease identified. Borderline cardiomegaly and aortic atherosclerosis. Electronically Signed   By: Nelson Chimes M.D.   On: 12/17/2018 14:04    Pending Labs Unresulted Labs (From admission, onward)    Start     Ordered   12/17/18 1645  Magnesium  Add-on,   AD     12/17/18 1644   12/17/18 1543  Gastrointestinal Panel by PCR , Stool  (Gastrointestinal Panel by PCR, Stool)  Once,   STAT     12/17/18 1542   12/17/18 1543  C difficile quick scan w PCR reflex  (C Difficile quick screen w PCR reflex panel)  Once, for 24 hours,   STAT     12/17/18 1542   12/17/18 1332  Urinalysis, Complete w Microscopic  ONCE - STAT,   STAT     12/17/18 1331   12/17/18 1331  Lactic acid, plasma  STAT Now then every 3 hours,   STAT     12/17/18 1331   12/17/18 1331  Blood Culture (routine x 2)  BLOOD CULTURE X 2,   STAT,   Status:  Canceled     12/17/18 1331   12/17/18 1331  Urine culture  ONCE - STAT,   STAT     12/17/18 1331   Signed and Held  Troponin I - Now Then Q6H  Now then every 6 hours,  TIMED     Signed and Held   Signed and Held  Influenza panel by PCR (type A & B)  (Influenza PCR Panel)  Once,   R     Signed and Held   Signed and Held  Parathyroid hormone, intact (no Ca)  Once,   R     Signed and Held   Signed and Held  VITAMIN D 25 Hydroxy (Vit-D Deficiency, Fractures)  Once,   R     Signed and Held   Signed and Held  Phosphorus  Once,   R     Signed and Held   Signed and Held  Ferritin  Tomorrow morning,   R     Signed and Held   Signed and Held  Iron and TIBC  Tomorrow morning,   R     Signed and Held   Visual merchandiser and Held  Folate  Tomorrow  morning,   R     Signed and Held   Signed and Held  Vitamin B12  Tomorrow morning,   R     Signed and Held   Signed and Held  Occult blood card to lab, stool RN will collect  Once,   R    Question:  Specimen to be collected by?  Answer:  RN will collect   Signed and Held          Vitals/Pain Today's Vitals   12/17/18 1348 12/17/18 1425 12/17/18 1500 12/17/18 1530  BP:  120/64 (!) 122/58 108/60  Pulse: 73 71 66 93  Resp:  14 15 (!) 23  Temp:      TempSrc:      SpO2: 99% 100% 99% 99%  Weight:      Height:      PainSc:        Isolation Precautions Enteric precautions (UV disinfection)  Medications Medications  magnesium sulfate IVPB 2 g 50 mL (2 g Intravenous Transfusing/Transfer 12/17/18 1720)  potassium chloride 10 mEq in 100 mL IVPB (has no administration in time range)  sodium chloride 0.9 % bolus 500 mL (500 mLs Intravenous Transfusing/Transfer 12/17/18 1720)  pantoprazole (PROTONIX) injection 40 mg (40 mg Intravenous Given 12/17/18 1638)  0.9 %  sodium chloride infusion ( Intravenous Transfusing/Transfer 12/17/18 1720)    Mobility walks with person assist Low fall risk   Focused Assessments Cardiac Assessment Handoff:    Lab Results  Component Value Date   TROPONINI <0.03 12/17/2018   No results found for: DDIMER Does the Patient currently have chest pain? No  , Pulmonary Assessment Handoff:  Lung sounds:   O2 Device: Room Air        R Recommendations: See Admitting Provider Note  Report given to: Oris Drone, RN (2A)  Additional Notes:

## 2018-12-17 NOTE — ED Triage Notes (Signed)
Pt arrives to ED from home via [facility] with c/c of "crushing chest pain" on left side, radiating down left arm. EMS reports transport vitals: 116/64, p 81, NSR, O2 sat 98% on room air, temp 98.1 axillary, CBG 171. Pt given 3 sublingual nitro, 325mg  chewable aspirin, 122mL NS. 20G placed in left forearm. Pt reports initial 10/10 pain which reduced to 4/10 after nitro. Upon arrival, Pt A&Ox4, NAD, no respiratory distress evident.

## 2018-12-17 NOTE — ED Provider Notes (Signed)
[facility] Emergency Department Provider Note    First MD Initiated Contact with Patient 12/17/18 1323     (approximate)  I have reviewed the triage vital signs and the nursing notes.   HISTORY  Chief Complaint Chest Pain    HPI [name] [name] is a 78 y.o. female presents for evaluation of midsternal nonradiating chest pain that occurred while she was at her doctor's office today.  She was going in for routine checkup due to flulike illness with nausea vomiting some several episodes of nonbloody watery diarrhea and generalized malaise.  Has had decreased oral intake.  She denies any pain at this time.  Via EMS they did give nitro and reported some improvement.  Denies any cough.     Past Medical History:  Diagnosis Date  . Anemia   . Arthritis   . Cancer ([place])    melanoma  . Chronic kidney disease    stage 3  . Complication of anesthesia    sometimes hard to wake up  . Diabetes ([place])    diet controlled  . 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 GERD (gastroesophageal reflux disease)   . Headache   . Hyperlipemia   . Hypertension   . Pain    chronic  . Rosacea   . 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 Sleep apnea    cpap   Family History  Problem Relation Age of Onset  . Aneurysm Father   . Hypertension Father   . Breast cancer Neg Hx    Past Surgical History:  Procedure Laterality Date  . ABDOMINAL HYSTERECTOMY    . BACK SURGERY     lumbar fusion, laminectomy  . BLEPHAROPLASTY Bilateral 10/24/14   Dr. Loni Muse. Vickki Muff, MBSC  . BREAST SURGERY     reduction  . CHOLECYSTECTOMY    . COLONOSCOPY WITH PROPOFOL N/A 03/17/2016   Procedure: COLONOSCOPY WITH PROPOFOL;  Surgeon: Manya Silvas, MD;  Location: [facility] ENDOSCOPY;  Service: Endoscopy;  Laterality: N/A;  . rcr    . REDUCTION MAMMAPLASTY Bilateral 1985  . SHOULDER ARTHROSCOPY WITH OPEN ROTATOR CUFF REPAIR Right 06/22/2015   Procedure: SHOULDER  ARTHROSCOPY WITH SUBACROMIAL DECOMPRESSION, RELEASE OF LONG HEAD OF BICEPS TENDON, RESECTION OF DISTAL CLAVICLE;  Surgeon: Leanor Kail, MD;  Location: [place];  Service: Orthopedics;  Laterality: Right;  DIABETIC - diet controlled CPAP   . SKIN CANCER EXCISION     leg, back   Patient Active Problem List   Diagnosis Date Noted  . Aftercare 07/20/2018  . Pain in right knee 06/22/2018  . Sleeping difficulty 06/22/2018  . History of seizure 07/15/2017  . Insomnia 06/16/2017  . Chest pain 08/23/2016  . Weakness of left upper extremity 08/23/2016  . 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 Piriformis syndrome of right side 08/24/2015  . Impingement syndrome of right shoulder 07/26/2015  . Chronic right shoulder pain 02/07/2015  . Difficulty in walking 09/22/2014  . Diarrhea 09/15/2014  . Lumbosacral spondylosis 12/26/2013  . DDD (degenerative disc disease), lumbar 12/26/2013  . 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 Arthritis, lumbar spine 12/26/2013  . Diabetes ([place]) 11/18/2013  . Dyslipidemia 11/18/2013  . Convulsions, epileptic ([place]) 11/18/2013  . BP (high blood pressure) 11/18/2013  . Obstructive apnea 11/18/2013  . Arthropathy of cervical facet joint 04/27/2012  . Pain in shoulder 03/25/2012  . Cervical spondylosis without myelopathy 12/31/2011  .  Cervical pain 12/31/2011      Prior to Admission medications   Medication Sig Start Date End Date Taking? Authorizing Provider  acetaminophen (TYLENOL) 500 MG tablet Take 500 mg by mouth every 8 (eight) hours as needed for mild pain.     [provider]  aspirin EC 81 MG tablet Take 1 tablet (81 mg total) by mouth daily. 08/23/16   Vaughan Basta, MD  atorvastatin (LIPITOR) 80 MG tablet TAKE 1 TABVLET BY MOUTH EVERY DAY 01/14/18   [provider]  BAYER MICROLET LANCETS lancets once daily. Use as instructed.dx e11.22 CONTOUR NEXT EZ TESTER 10/22/16   [provider]   carvedilol (COREG) 12.5 MG tablet Take 12.5 mg by mouth 2 (two) times daily with a meal.    [provider]  conjugated estrogens (PREMARIN) vaginal cream Apply 0.5mg  (pea-sized amount)  just inside the vaginal introitus with a finger-tip on  Monday, Wednesday and Friday nights. 02/25/18   McGowan, Larene Beach A, PA-C  doxycycline (VIBRA-TABS) 100 MG tablet TAKE 1 TABLET EVERY DAY TO EVERY OTHER DAY AS DIRECTED 02/05/18   [provider]  glucose blood (CONTOUR NEXT TEST) test strip Use once daily. Use as instructed.dx e11.9 05/26/14   [provider]  levETIRAcetam (KEPPRA) 500 MG tablet levetiracetam 500 mg tablet 08/04/18   [provider]  losartan (COZAAR) 100 MG tablet Take 100 mg by mouth daily. 08/10/18   [provider]  losartan-hydrochlorothiazide (HYZAAR) 100-25 MG tablet Take 1 tablet by mouth daily.  03/28/09   [provider]  mirabegron ER (MYRBETRIQ) 50 MG TB24 tablet Take 1 tablet (50 mg total) by mouth daily. 03/23/18   Zara Council A, PA-C  omeprazole (PRILOSEC) 40 MG capsule Take 40 mg by mouth daily.  10/11/08   [provider]  risperiDONE (RISPERDAL) 0.5 MG tablet Take 0.5 mg by mouth at bedtime. 07/21/18   [provider]    Allergies Contrast media [iodinated diagnostic agents]; Tramadol; Adhesive [tape]; and Penicillins    Social History Social History   Tobacco Use  . Smoking status: Never Smoker  . Smokeless tobacco: Never Used  Substance Use Topics  . Alcohol use: No  . Drug use: No    Review of Systems Patient denies headaches, rhinorrhea, blurry vision, numbness, shortness of breath, chest pain, edema, cough, abdominal pain, nausea, vomiting, diarrhea, dysuria, fevers, rashes or hallucinations unless otherwise stated above in HPI. ____________________________________________   PHYSICAL EXAM:  VITAL SIGNS: There were no vitals filed for this visit.  Constitutional: Alert and  oriented.  Eyes: Conjunctivae are normal.  Head: Atraumatic. Nose: No congestion/rhinnorhea. Mouth/Throat: Mucous membranes are moist.   Neck: No stridor. Painless ROM.  Cardiovascular: Normal rate, regular rhythm. Grossly normal heart sounds.  Good peripheral circulation. Respiratory: Normal respiratory effort.  No retractions. Lungs CTAB. Gastrointestinal: Soft and nontender in all four quadrants. No distention. No abdominal bruits. No CVA tenderness. Genitourinary:  Musculoskeletal: No lower extremity tenderness nor edema.  No joint effusions. Neurologic:  Normal speech and language. No gross focal neurologic deficits are appreciated. No facial droop Skin:  Skin is warm, dry and intact. No rash noted. Psychiatric: Mood and affect are normal. Speech and behavior are normal.  ____________________________________________   LABS (all labs ordered are listed, but only abnormal results are displayed)  No results found for this or any previous visit (from the past 24 hour(s)). ____________________________________________  EKG My review and personal interpretation at Time: 13:34   Indication: chest pain  Rate: 75  Rhythm: sinus Axis: nrnak Other: down sloping st depression in anterior leads, no stemi ____________________________________________  RADIOLOGY  I personally reviewed all radiographic images ordered to evaluate for the above acute complaints and reviewed radiology reports and findings.  These findings were personally discussed with the patient.  Please see medical record for radiology report.  ____________________________________________   PROCEDURES  Procedure(s) performed:  Procedures    Critical Care performed: no ____________________________________________   INITIAL IMPRESSION / ASSESSMENT AND PLAN / ED COURSE  Pertinent labs & imaging results that were available during my care of the patient were reviewed by me and considered in my medical decision making  (see chart for details).   DDX: ACS, pericarditis, esophagitis, boerhaaves, pe, dissection, pna, bronchitis, costochondritis, corona   JAIDEE STIPE is a 78 y.o. who presents to the ED with symptoms as described above.  Patient nontoxic-appearing.  Initial EKG does not show any evidence of STEMI but does have some nonspecific ST changes.  May be secondary to electrolyte abnormality given diarrheal illness but will further re-stratify for ACS.  Does not seem consistent with dissection.  Her abdominal exam is soft and benign.  Do not feel CT imaging clinically indicated.  Clinical Course as of Dec 17 1539  Fri Dec 17, 2018  1519 Patient with evidence of AKI.  Has been having non-melanotic diarrheal illness with evidence of significant dehydration.   [PR]    Clinical Course User Index [PR] Merlyn Lot, MD    The patient was evaluated in Emergency Department today for the symptoms described in the history of present illness. He/she was evaluated in the context of the global COVID-19 pandemic, which necessitated consideration that the patient might be at risk for infection with the SARS-CoV-2 virus that causes COVID-19. Institutional protocols and algorithms that pertain to the evaluation of patients at risk for COVID-19 are in a state of rapid change based on information released by regulatory bodies including the CDC and federal and state organizations. These policies and algorithms were followed during the patient's care in the ED.  As part of my medical decision making, I reviewed the following data within the [place] notes reviewed and incorporated, Labs reviewed, notes from prior ED visits and [place] Controlled Substance Database   ____________________________________________   FINAL CLINICAL IMPRESSION(S) / ED DIAGNOSES  Final diagnoses:  Chest pain, unspecified type  Hypokalemia due to excessive gastrointestinal loss of potassium  Diarrhea, unspecified  type      NEW MEDICATIONS STARTED DURING THIS VISIT:  New Prescriptions   No medications on file     Note:  This document was prepared using Dragon voice recognition software and may include unintentional dictation errors.    Merlyn Lot, MD 12/17/18 [phone]

## 2018-12-17 NOTE — Plan of Care (Signed)
  Problem: Education: Goal: Knowledge of General Education information will improve Description Including pain rating scale, medication(s)/side effects and non-pharmacologic comfort measures Outcome: Progressing   Problem: Health Behavior/Discharge Planning: Goal: Ability to manage health-related needs will improve Outcome: Progressing   

## 2018-12-17 NOTE — Progress Notes (Addendum)
CRITICAL VALUE ALERT  Critical Value:  Mg 0.9   Date & Time Notied:  12/17/18 1826  Provider Notified: Dr. Brett Albino paged  Orders Received/Actions taken: awaiting for callback. Update 1840: no new orders

## 2018-12-17 NOTE — ED Notes (Signed)
Date and time results received: 12/17/18 1510   Test: potassium Critical Value: 2.3  Name of Provider Notified: Dr Quentin Cornwall

## 2018-12-18 ENCOUNTER — Observation Stay (HOSPITAL_BASED_OUTPATIENT_CLINIC_OR_DEPARTMENT_OTHER)
Admit: 2018-12-18 | Discharge: 2018-12-18 | Disposition: A | Discharge status: Home / Self Care | Payer: Medicare HMO | Attending: Internal Medicine | Admitting: Internal Medicine

## 2018-12-18 DIAGNOSIS — I361 Nonrheumatic tricuspid (valve) insufficiency: Secondary | ICD-10-CM

## 2018-12-18 DIAGNOSIS — R079 Chest pain, unspecified: Secondary | ICD-10-CM | POA: Diagnosis not present

## 2018-12-18 LAB — IRON AND TIBC
Iron: 21 ug/dL — ABNORMAL LOW (ref 28–170)
Saturation Ratios: 10 % — ABNORMAL LOW (ref 10.4–31.8)
TIBC: 207 ug/dL — ABNORMAL LOW (ref 250–450)
UIBC: 187 ug/dL

## 2018-12-18 LAB — TROPONIN I
Troponin I: <0.03 ng/mL (ref <0.03)
Troponin I: <0.03 ng/mL (ref <0.03)

## 2018-12-18 LAB — POTASSIUM: Potassium: 2.5 mmol/L — CL (ref 3.5–5.1)

## 2018-12-18 LAB — VITAMIN B12: Vitamin B-12: 236 pg/mL (ref 180–914)

## 2018-12-18 LAB — GLUCOSE, CAPILLARY: Glucose-Capillary: 93 mg/dL (ref 70–99)

## 2018-12-18 LAB — MAGNESIUM: Magnesium: 1.7 mg/dL (ref 1.7–2.4)

## 2018-12-18 LAB — FOLATE: Folate: 20.9 ng/mL (ref >5.9)

## 2018-12-18 LAB — FERRITIN: Ferritin: 166 ng/mL (ref 11–307)

## 2018-12-18 MED ORDER — POTASSIUM CHLORIDE CRYS ER 20 MEQ PO TBCR
40.0000 meq | EXTENDED_RELEASE_TABLET | Freq: Three times a day (TID) | ORAL | Status: AC
  Administered 2018-12-18 (×2): 40 meq via ORAL
  Filled 2018-12-18 (×2): qty 2

## 2018-12-18 MED ORDER — POTASSIUM CHLORIDE 20 MEQ PO PACK
40.0000 meq | PACK | Freq: Three times a day (TID) | ORAL | Status: DC
  Administered 2018-12-18: 40 meq via ORAL
  Filled 2018-12-18 (×2): qty 2

## 2018-12-18 MED ORDER — VITAMIN C 500 MG PO TABS
250.0000 mg | ORAL_TABLET | Freq: Two times a day (BID) | ORAL | Status: DC
  Administered 2018-12-18 – 2018-12-19 (×3): 250 mg via ORAL
  Filled 2018-12-18 (×3): qty 1

## 2018-12-18 MED ORDER — FLORANEX PO PACK
1.0000 g | PACK | Freq: Three times a day (TID) | ORAL | Status: DC
  Administered 2018-12-18 – 2018-12-19 (×3): 1 g via ORAL
  Filled 2018-12-18 (×6): qty 1

## 2018-12-18 MED ORDER — CYCLOBENZAPRINE HCL 10 MG PO TABS
5.0000 mg | ORAL_TABLET | Freq: Three times a day (TID) | ORAL | Status: DC | PRN
  Administered 2018-12-18 (×2): 5 mg via ORAL
  Filled 2018-12-18 (×2): qty 1

## 2018-12-18 MED ORDER — FERROUS SULFATE 325 (65 FE) MG PO TABS
325.0000 mg | ORAL_TABLET | Freq: Two times a day (BID) | ORAL | Status: DC
  Administered 2018-12-18 – 2018-12-19 (×3): 325 mg via ORAL
  Filled 2018-12-18 (×3): qty 1

## 2018-12-18 MED ORDER — FLUTICASONE PROPIONATE 50 MCG/ACT NA SUSP
2.0000 | Freq: Every day | NASAL | Status: DC
  Administered 2018-12-18: 2 via NASAL
  Filled 2018-12-18: qty 16

## 2018-12-18 NOTE — Progress Notes (Signed)
[place] [facility] [place] NAME: Matelyn Antonelli    MR#:  [id number]  DATE OF BIRTH:  Mar 02, 1941  SUBJECTIVE:  Patient without complaint, diarrhea has resolved, cardiology input appreciated, replete potassium  REVIEW OF SYSTEMS:  CONSTITUTIONAL: No fever, fatigue or weakness.  EYES: No blurred or double vision.  EARS, NOSE, AND THROAT: No tinnitus or ear pain.  RESPIRATORY: No cough, shortness of breath, wheezing or hemoptysis.  CARDIOVASCULAR: No chest pain, orthopnea, edema.  GASTROINTESTINAL: No nausea, vomiting, diarrhea or abdominal pain.  GENITOURINARY: No dysuria, hematuria.  ENDOCRINE: No polyuria, nocturia,  HEMATOLOGY: No anemia, easy bruising or bleeding SKIN: No rash or lesion. MUSCULOSKELETAL: No joint pain or arthritis.   NEUROLOGIC: No tingling, numbness, weakness.  PSYCHIATRY: No anxiety or depression.   ROS  DRUG ALLERGIES:   Allergies  Allergen Reactions  . Contrast Media [Iodinated Diagnostic Agents] Other (See Comments)    Made hands and feet "draw up"  . Tramadol Other (See Comments)    Other Reaction: OTHER REACTION, SOB, AGITATIO  . Adhesive [Tape] Rash    Some bandaids  . Penicillins Rash and Other (See Comments)    Has patient had a PCN reaction causing immediate rash, facial/tongue/throat swelling, SOB or lightheadedness with hypotension: Yes Has patient had a PCN reaction causing severe rash involving mucus membranes or skin necrosis: No Has patient had a PCN reaction that required hospitalization No Has patient had a PCN reaction occurring within the last 10 years: No If all of the above answers are "NO", then may proceed with Cephalosporin use.     VITALS:  Blood pressure 123/62, pulse 68, temperature 98.9 F (37.2 C), temperature source Oral, resp. rate 18, height 4\' 11"  (1.499 m), weight 72.4 kg, SpO2 99 %.  PHYSICAL EXAMINATION:  GENERAL:  78 y.o.-year-old patient lying in the bed with no acute  distress.  EYES: Pupils equal, round, reactive to light and accommodation. No scleral icterus. Extraocular muscles intact.  HEENT: Head atraumatic, normocephalic. Oropharynx and nasopharynx clear.  NECK:  Supple, no jugular venous distention. No thyroid enlargement, no tenderness.  LUNGS: Normal breath sounds bilaterally, no wheezing, rales,rhonchi or crepitation. No use of accessory muscles of respiration.  CARDIOVASCULAR: S1, S2 normal. No murmurs, rubs, or gallops.  ABDOMEN: Soft, nontender, nondistended. Bowel sounds present. No organomegaly or mass.  EXTREMITIES: No pedal edema, cyanosis, or clubbing.  NEUROLOGIC: Cranial nerves II through XII are intact. Muscle strength 5/5 in all extremities. Sensation intact. Gait not checked.  PSYCHIATRIC: The patient is alert and oriented x 3.  SKIN: No obvious rash, lesion, or ulcer.   Physical Exam LABORATORY PANEL:   CBC Recent Labs  Lab 12/17/18 1416  WBC 6.7  HGB 9.2*  HCT 28.1*  PLT 231   ------------------------------------------------------------------------------------------------------------------  Chemistries  Recent Labs  Lab 12/17/18 1416  12/18/18 0558  NA 141  --   --   K 2.3*   < > 2.5*  CL 101  --   --   CO2 24  --   --   GLUCOSE 119*  --   --   BUN 29*  --   --   CREATININE 2.21*  --   --   CALCIUM 5.3*  --   --   MG 0.9*  --  1.7  AST 35  --   --   ALT 29  --   --   ALKPHOS 96  --   --   BILITOT 0.8  --   --    < > =  values in this interval not displayed.   ------------------------------------------------------------------------------------------------------------------  Cardiac Enzymes Recent Labs  Lab 12/17/18 2348 12/18/18 0558  TROPONINI <0.03 <0.03   ------------------------------------------------------------------------------------------------------------------  RADIOLOGY:  Dg Chest Port 1 View  Result Date: 12/17/2018 CLINICAL DATA:  Chest pain and weakness today. EXAM: PORTABLE CHEST 1  VIEW COMPARISON:  08/22/2016 FINDINGS: Borderline cardiomegaly. Mild thoracic aortic tortuosity. Poor inspiration. No evidence of heart failure or effusion. No consolidation, collapse or focal lesion. IMPRESSION: Poor inspiration. No active disease identified. Borderline cardiomegaly and aortic atherosclerosis. Electronically Signed   By: Nelson Chimes M.D.   On: 12/17/2018 14:04    ASSESSMENT AND PLAN:  *Acute atypical/noncardiac chest pain Resolved Cardiology/Dr. Rosary Lively. Nahser did see patient while in house-no intervention recommended Continue aspirin, Coreg, Lipitor Follow-up: ECHO  *Acute nausea/vomiting/diarrhea likely viral gastroenteritis Resolved COVID/influenza negative continue home PPI  *AKI likely due to dehydration in the setting of vomiting and diarrhea. Resolved with IV fluids for rehydration We will restart losartan and HCTZ on day of discharge  *Acute severe hypokalemia  likely due to diarrhea Potassium 40 mEq 3 times daily, magnesium level normal, recheck BMP in the morning  *Hypocalcemia Likely due to diarrhea/emesis Magnesium level normal, phosphorus level normal Schedule calcium twice daily  *Microcytic anemia No active bleeding. Most likely secondary to iron deficiency given anemia work-up, start ferrous sulfate and vitamin C twice daily  *History of seizures Stable on Keppra  *Chronic OSA stable Continue CPAP nightly  Disposition home on tomorrow barring any complications  All the records are reviewed and case discussed with Care Management/Social Workerr. Management plans discussed with the patient, family and they are in agreement.  CODE STATUS: full  TOTAL TIME TAKING CARE OF THIS PATIENT: 35 minutes.   POSSIBLE D/C IN 1 DAYS, DEPENDING ON CLINICAL CONDITION.   Avel Peace Shaquaya Wuellner M.D on 12/18/2018   Between 7am to 6pm - Pager - [phone]  After 6pm go to [email or web address] - password [email or web address] [facility]  Office   [phone]  CC: Primary care physician; Kirk Ruths, MD  Note: This dictation was prepared with Dragon dictation along with smaller phrase technology. Any transcriptional errors that result from this process are unintentional.

## 2018-12-18 NOTE — Progress Notes (Signed)
Notify Dr. Jerelyn Charles and asked if we need to recheck her potassium again this afternoon since it was 2.5 this morning, she did received 80 meq of potassium chloride, per MD lab will be recheck in the morning. RN will continue to monitor.

## 2018-12-18 NOTE — Care Management Obs Status (Signed)
[facility] NOTIFICATION   Patient Details  Name: [name] [name] MRN: [id number] Date of Birth: 04/07/1941   Medicare Observation Status Notification Given:  Yes    [name] Zettlemoyer A Galileah Piggee, RN 12/18/2018, 2:11 PM

## 2018-12-18 NOTE — Progress Notes (Signed)
Progress Note  Patient Name: VIANKA ERTEL Date of Encounter: 12/18/2018  Primary Cardiologist:  Dr. Rockey Situ   Subjective   78 year old female with a history of chronic kidney disease, diet-controlled diabetes mellitus, hypertension, hyperlipidemia, obstructive sleep apnea.  She presented to the emergency room with crushing chest pain radiating down her left arm.  She has had anorexia, diarrhea for the past 4 days.  These are associated with Reiger's and generalized malaise.  Troponin levels remain negative x3.   She has persistent marked hypokalemia.  Inpatient Medications    Scheduled Meds: . aspirin EC  81 mg Oral Daily  . atorvastatin  80 mg Oral q1800  . carvedilol  12.5 mg Oral BID WC  . enoxaparin (LOVENOX) injection  30 mg Subcutaneous Q24H  . ferrous sulfate  325 mg Oral BID WC  . fluticasone  2 spray Each Nare QHS  . lactobacillus  1 g Oral TID WC  . levETIRAcetam  500 mg Oral BID  . mirabegron ER  50 mg Oral Daily  . pantoprazole  80 mg Oral Daily  . potassium chloride  40 mEq Oral TID  . risperiDONE  0.5 mg Oral QHS  . vitamin C  250 mg Oral BID   Continuous Infusions:  PRN Meds: acetaminophen, alum & mag hydroxide-simeth **AND** lidocaine, nitroGLYCERIN, ondansetron (ZOFRAN) IV   Vital Signs    Vitals:   12/17/18 1813 12/17/18 2105 12/18/18 0427 12/18/18 0833  BP: (!) 123/109 (!) 153/72 (!) 131/57 123/62  Pulse: 70 78 71 68  Resp: 18 16 18    Temp: 97.8 F (36.6 C) 98.9 F (37.2 C) 98 F (36.7 C) 98.9 F (37.2 C)  TempSrc: Oral Oral Oral Oral  SpO2:   98% 99%  Weight:   72.4 kg   Height:        Intake/Output Summary (Last 24 hours) at 12/18/2018 1115 Last data filed at 12/18/2018 0500 Gross per 24 hour  Intake 388.02 ml  Output 1150 ml  Net -761.98 ml   Last 3 Weights 12/18/2018 12/17/2018 12/17/2018  Weight (lbs) 159 lb 9.6 oz 158 lb 14.4 oz 153 lb  Weight (kg) 72.394 kg 72.077 kg 69.4 kg      Telemetry    NSR  - Personally Reviewed  ECG      NSR , no ST or T wave changes.  - Personally Reviewed  Physical Exam   GEN: No acute distress.   Neck: No JVD Cardiac: RRR, no murmurs, rubs, or gallops.  Respiratory: Clear to auscultation bilaterally. GI: Soft, nontender, non-distended  MS: No edema; No deformity. Neuro:  Nonfocal  Psych: Normal affect   Labs    Chemistry Recent Labs  Lab 12/17/18 1416 12/17/18 1831 12/18/18 0558  NA 141  --   --   K 2.3* 2.6* 2.5*  CL 101  --   --   CO2 24  --   --   GLUCOSE 119*  --   --   BUN 29*  --   --   CREATININE 2.21*  --   --   CALCIUM 5.3*  --   --   PROT 6.1*  --   --   ALBUMIN 3.1*  --   --   AST 35  --   --   ALT 29  --   --   ALKPHOS 96  --   --   BILITOT 0.8  --   --   GFRNONAA 21*  --   --  GFRAA 24*  --   --   ANIONGAP 16*  --   --      Hematology Recent Labs  Lab 12/17/18 1416  WBC 6.7  RBC 3.06*  HGB 9.2*  HCT 28.1*  MCV 91.8  MCH 30.1  MCHC 32.7  RDW 14.0  PLT 231    Cardiac Enzymes Recent Labs  Lab 12/17/18 1416 12/17/18 1832 12/17/18 2348 12/18/18 0558  TROPONINI <0.03 <0.03 <0.03 <0.03   No results for input(s): TROPIPOC in the last 168 hours.   BNPNo results for input(s): BNP, PROBNP in the last 168 hours.   DDimer No results for input(s): DDIMER in the last 168 hours.   Radiology    Dg Chest Port 1 View  Result Date: 12/17/2018 CLINICAL DATA:  Chest pain and weakness today. EXAM: PORTABLE CHEST 1 VIEW COMPARISON:  08/22/2016 FINDINGS: Borderline cardiomegaly. Mild thoracic aortic tortuosity. Poor inspiration. No evidence of heart failure or effusion. No consolidation, collapse or focal lesion. IMPRESSION: Poor inspiration. No active disease identified. Borderline cardiomegaly and aortic atherosclerosis. Electronically Signed   By: Nelson Chimes M.D.   On: 12/17/2018 14:04    Cardiac Studies     Patient Profile     78 y.o. female   Admitted with atypical cp in the setting of profound electrolyte abn. And abd. Pain    Assessment & Plan    1.  CP :   Very atypical .   troponins are negative.   I agree with Dr. Rockey Situ that this does not reguire any further evaluation.   2.  Hypokalemia:   Getting potassium replacement .   No significant arrhythmias .     [facility] will sign off.   Medication Recommendations:  Cont work up for abdominal pain / indigestion .  Other recommendations (labs, testing, etc):   Follow up as an outpatient:  With her primary MD   For questions or updates, please contact [place] Please consult [email or web address] for contact info under        Signed, Mertie Moores, MD  12/18/2018, 11:15 AM

## 2018-12-19 LAB — PARATHYROID HORMONE, INTACT (NO CA): PTH: 102 pg/mL — ABNORMAL HIGH (ref 15–65)

## 2018-12-19 LAB — POTASSIUM: Potassium: 3.8 mmol/L (ref 3.5–5.1)

## 2018-12-19 LAB — URINE CULTURE

## 2018-12-19 LAB — ECHOCARDIOGRAM COMPLETE
Height: 59 in
Weight: 2553.6 oz

## 2018-12-19 MED ORDER — ASCORBIC ACID 250 MG PO TABS: 250.0000 mg | ORAL_TABLET | Freq: Two times a day (BID) | ORAL | 0 refills | Status: DC

## 2018-12-19 MED ORDER — FERROUS SULFATE 325 (65 FE) MG PO TABS: 325.0000 mg | ORAL_TABLET | Freq: Two times a day (BID) | ORAL | 0 refills | Status: DC

## 2018-12-19 NOTE — Progress Notes (Signed)
[name] to be D/C'd Home per MD order.  Discussed prescriptions and follow up appointments with the patient. Prescriptions given to patient, medication list explained in detail. Pt verbalized understanding.  Allergies as of 12/19/2018      Reactions   Contrast Media [iodinated Diagnostic Agents] Other (See Comments)   Made hands and feet "draw up"   Tramadol Other (See Comments)   Other Reaction: OTHER REACTION, SOB, AGITATIO   Adhesive [tape] Rash   Some bandaids   Penicillins Rash, Other (See Comments)   Has patient had a PCN reaction causing immediate rash, facial/tongue/throat swelling, SOB or lightheadedness with hypotension: Yes Has patient had a PCN reaction causing severe rash involving mucus membranes or skin necrosis: No Has patient had a PCN reaction that required hospitalization No Has patient had a PCN reaction occurring within the last 10 years: No If all of the above answers are "NO", then may proceed with Cephalosporin use.      Medication List    TAKE these medications   acetaminophen 500 MG tablet Commonly known as:  TYLENOL Take 500 mg by mouth every 8 (eight) hours as needed for mild pain.   ascorbic acid 250 MG tablet Commonly known as:  VITAMIN C Take 1 tablet (250 mg total) by mouth 2 (two) times daily.   aspirin EC 81 MG tablet Take 1 tablet (81 mg total) by mouth daily.   atorvastatin 80 MG tablet Commonly known as:  LIPITOR TAKE 1 TABVLET BY MOUTH EVERY DAY   carvedilol 12.5 MG tablet Commonly known as:  COREG Take 12.5 mg by mouth 2 (two) times daily with a meal.   conjugated estrogens vaginal cream Commonly known as:  Premarin Apply 0.5mg  (pea-sized amount)  just inside the vaginal introitus with a finger-tip on  Monday, Wednesday and Friday nights.   doxycycline 100 MG tablet Commonly known as:  VIBRA-TABS 100 mg 2 (two) times daily.   ferrous sulfate 325 (65 FE) MG tablet Take 1 tablet (325 mg total) by mouth 2 (two) times daily  with a meal.   levETIRAcetam 500 MG tablet Commonly known as:  KEPPRA Take 500 mg by mouth daily.   losartan 100 MG tablet Commonly known as:  COZAAR Take 100 mg by mouth daily.   mirabegron ER 50 MG Tb24 tablet Commonly known as:  MYRBETRIQ Take 1 tablet (50 mg total) by mouth daily.   PriLOSEC 40 MG capsule Generic drug:  omeprazole Take 40 mg by mouth daily.       Vitals:   12/19/18 0518 12/19/18 0804  BP: 126/67 (!) 143/68  Pulse: 70 71  Resp: 16   Temp: (!) 97.4 F (36.3 C) 97.8 F (36.6 C)  SpO2: 97% 99%    Skin clean, dry and intact without evidence of skin break down, no evidence of skin tears noted. IV catheter discontinued intact. Site without signs and symptoms of complications. Dressing and pressure applied. Pt denies pain at this time. No complaints noted.  An After Visit Summary was printed and given to the patient. Patient escorted via [place], and D/C home via private auto.  [name] [name]

## 2018-12-19 NOTE — Discharge Summary (Signed)
[facility] [place] NAME: [name] [name]    MR#:  [id number]  DATE OF BIRTH:  [birth year]  DATE OF ADMISSION:  12/17/2018 ADMITTING PHYSICIAN: Sela Hua, MD  DATE OF DISCHARGE: No discharge date for patient encounter.  PRIMARY CARE PHYSICIAN: Kirk Ruths, MD    ADMISSION DIAGNOSIS:  Chest pain, unspecified type [R07.9] Diarrhea, unspecified type [R19.7] Hypokalemia due to excessive gastrointestinal loss of potassium [E87.6]  DISCHARGE DIAGNOSIS:  Active Problems:   Chest pain   SECONDARY DIAGNOSIS:   Past Medical History:  Diagnosis Date  . Anemia   . Arthritis   . Cancer ([place])    melanoma  . Chronic kidney disease    stage 3  . Complication of anesthesia    sometimes hard to wake up  . Diabetes ([place])    diet controlled  . 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 GERD (gastroesophageal reflux disease)   . Headache   . Hyperlipemia   . Hypertension   . Pain    chronic  . Rosacea   . 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 Sleep apnea    cpap    HOSPITAL COURSE:  *Acute atypical/noncardiac chest pain Resolved Cardiology/Dr. Rosary Lively. Nahser did see patient while in house-no intervention recommended Treated with aspirin, Coreg, Lipitor Echocardiogram was a normal study  *Acute nausea/vomiting/diarrhea likely viral gastroenteritis Resolved COVID/influenza negative  *AKI likely due to dehydration in the setting of vomiting and diarrhea. Resolved with IV fluids for rehydration Restarted losartan and HCTZ on day of discharge  *Acute severe hypokalemia  likely due to diarrhea Resolved with potassium repletion  *Hypocalcemia Likely due to diarrhea/emesis Magnesium level normal, phosphorus level normal Repleted  *Microcytic anemia No active bleeding. Most likely secondary to iron deficiency given anemia work-up, started ferrous sulfate and vitamin C  twice daily  *History of seizures Stable on Keppra  *Chronic OSA stable Continued CPAP nightly    DISCHARGE CONDITIONS:   stable  CONSULTS OBTAINED:    DRUG ALLERGIES:   Allergies  Allergen Reactions  . Contrast Media [Iodinated Diagnostic Agents] Other (See Comments)    Made hands and feet "draw up"  . Tramadol Other (See Comments)    Other Reaction: OTHER REACTION, SOB, AGITATIO  . Adhesive [Tape] Rash    Some bandaids  . Penicillins Rash and Other (See Comments)    Has patient had a PCN reaction causing immediate rash, facial/tongue/throat swelling, SOB or lightheadedness with hypotension: Yes Has patient had a PCN reaction causing severe rash involving mucus membranes or skin necrosis: No Has patient had a PCN reaction that required hospitalization No Has patient had a PCN reaction occurring within the last 10 years: No If all of the above answers are "NO", then may proceed with Cephalosporin use. 78     DISCHARGE MEDICATIONS:   Allergies as of 12/19/2018      Reactions   Contrast Media [iodinated Diagnostic Agents] Other (See Comments)   Made hands and feet "draw up"   Tramadol Other (See Comments)   Other Reaction: OTHER REACTION, SOB, AGITATIO   Adhesive [tape] Rash   Some bandaids   Penicillins Rash, Other (See Comments)   Has patient had a PCN reaction causing immediate rash, facial/tongue/throat swelling, SOB or lightheadedness with hypotension: Yes Has patient had a PCN reaction causing severe rash involving mucus membranes or skin necrosis: No Has patient had a  PCN reaction that required hospitalization No Has patient had a PCN reaction occurring within the last 10 years: No If all of the above answers are "NO", then may proceed with Cephalosporin use. 78      Medication List    TAKE these medications   acetaminophen 500 MG tablet Commonly known as:  TYLENOL Take 500 mg by mouth every 8 (eight) hours as needed for mild pain.   ascorbic acid 250 MG  tablet Commonly known as:  VITAMIN C Take 1 tablet (250 mg total) by mouth 2 (two) times daily.   aspirin EC 81 MG tablet Take 1 tablet (81 mg total) by mouth daily.   atorvastatin 80 MG tablet Commonly known as:  LIPITOR TAKE 1 TABVLET BY MOUTH EVERY DAY   carvedilol 12.5 MG tablet Commonly known as:  COREG Take 12.5 mg by mouth 2 (two) times daily with a meal.   conjugated estrogens vaginal cream Commonly known as:  Premarin Apply 0.5mg  (pea-sized amount)  just inside the vaginal introitus with a finger-tip on  Monday, Wednesday and Friday nights.   doxycycline 100 MG tablet Commonly known as:  VIBRA-TABS 100 mg 2 (two) times daily.   ferrous sulfate 325 (65 FE) MG tablet Take 1 tablet (325 mg total) by mouth 2 (two) times daily with a meal.   levETIRAcetam 500 MG tablet Commonly known as:  KEPPRA Take 500 mg by mouth daily.   losartan 100 MG tablet Commonly known as:  COZAAR Take 100 mg by mouth daily.   mirabegron ER 50 MG Tb24 tablet Commonly known as:  MYRBETRIQ Take 1 tablet (50 mg total) by mouth daily.   PriLOSEC 40 MG capsule Generic drug:  omeprazole Take 40 mg by mouth daily.        DISCHARGE INSTRUCTIONS:    If you experience worsening of your admission symptoms, develop shortness of breath, life threatening emergency, suicidal or homicidal thoughts you must seek medical attention immediately by calling 911 or calling your MD immediately  if symptoms less severe.  You Must read complete instructions/literature along with all the possible adverse reactions/side effects for all the Medicines you take and that have been prescribed to you. Take any new Medicines after you have completely understood and accept all the possible adverse reactions/side effects.   Please note  You were cared for by a hospitalist during your hospital stay. If you have any questions about your discharge medications or the care you received while you were in the hospital after  you are discharged, you can call the unit and asked to speak with the hospitalist on call if the hospitalist that took care of you is not available. Once you are discharged, your primary care physician will handle any further medical issues. Please note that NO REFILLS for any discharge medications will be authorized once you are discharged, as it is imperative that you return to your primary care physician (or establish a relationship with a primary care physician if you do not have one) for your aftercare needs so that they can reassess your need for medications and monitor your lab values.    Today   CHIEF COMPLAINT:   Chief Complaint  Patient presents with  . Chest Pain    HISTORY OF PRESENT ILLNESS:  78 y.o. female with a known history of type 2 diabetes, hyperlipidemia, CKD 3, OSA, history of seizures, chronic pain who presented to the ED with chest tightness.  She has had nausea, vomiting, and diarrhea for the last  couple days.  The vomiting and diarrhea stopped spontaneously 2 days ago.  She still was not feeling well, so she called her PCPs office and was tested for strep, flu, and COVID.  She has not received the results of these tests yet.  After seeing her PCP, she came home and was sitting at her kitchen table reading the paper, when she suddenly developed chills and chest tightness.  She also developed some pain that radiated down her left arm.  She had some associated diaphoresis.  Her husband called 86.  While in route to the ED, she received nitroglycerin which resolved her chest tightness.  In the ED, vitals were unremarkable.  Labs were significant for K2.3, creatinine 2.21, calcium 5.3, hemoglobin 9.2.  COVID testing was negative.  Chest x-ray without acute findings.  Initial troponin was negative.  Hospitalists were called for admission.    VITAL SIGNS:  Blood pressure (!) 143/68, pulse 71, temperature 97.8 F (36.6 C), temperature source Oral, resp. rate 16, height 4'  11" (1.499 m), weight 71.9 kg, SpO2 99 %.  I/O:    Intake/Output Summary (Last 24 hours) at 12/19/2018 0951 Last data filed at 12/19/2018 0856 Gross per 24 hour  Intake 480 ml  Output 2000 ml  Net -1520 ml    PHYSICAL EXAMINATION:  GENERAL:  78 y.o.-year-old patient lying in the bed with no acute distress.  EYES: Pupils equal, round, reactive to light and accommodation. No scleral icterus. Extraocular muscles intact.  HEENT: Head atraumatic, normocephalic. Oropharynx and nasopharynx clear.  NECK:  Supple, no jugular venous distention. No thyroid enlargement, no tenderness.  LUNGS: Normal breath sounds bilaterally, no wheezing, rales,rhonchi or crepitation. No use of accessory muscles of respiration.  CARDIOVASCULAR: S1, S2 normal. No murmurs, rubs, or gallops.  ABDOMEN: Soft, non-tender, non-distended. Bowel sounds present. No organomegaly or mass.  EXTREMITIES: No pedal edema, cyanosis, or clubbing.  NEUROLOGIC: Cranial nerves II through XII are intact. Muscle strength 5/5 in all extremities. Sensation intact. Gait not checked.  PSYCHIATRIC: The patient is alert and oriented x 3.  SKIN: No obvious rash, lesion, or ulcer.   DATA REVIEW:   CBC Recent Labs  Lab 12/17/18 1416  WBC 6.7  HGB 9.2*  HCT 28.1*  PLT 231    Chemistries  Recent Labs  Lab 12/17/18 1416  12/18/18 0558 12/19/18 0454  NA 141  --   --   --   K 2.3*   < > 2.5* 3.8  CL 101  --   --   --   CO2 24  --   --   --   GLUCOSE 119*  --   --   --   BUN 29*  --   --   --   CREATININE 2.21*  --   --   --   CALCIUM 5.3*  --   --   --   MG 0.9*  --  1.7  --   AST 35  --   --   --   ALT 29  --   --   --   ALKPHOS 96  --   --   --   BILITOT 0.8  --   --   --    < > = values in this interval not displayed.    Cardiac Enzymes Recent Labs  Lab 12/18/18 0558  TROPONINI <0.03    Microbiology Results  Results for orders placed or performed during the hospital encounter of 12/17/18  Blood Culture (  routine  x 2)     Status: None (Preliminary result)   Collection Time: 12/17/18  2:16 PM  Result Value Ref Range Status   Specimen Description BLOOD LEFT ARM  Final   Special Requests   Final    BOTTLES DRAWN AEROBIC AND ANAEROBIC Blood Culture adequate volume   Culture   Final    NO GROWTH 2 DAYS Performed at [facility], [address], [place], [place] [address]    Report Status PENDING  Incomplete  SARS Coronavirus 2 [facility] order, Performed in [place] hospital lab)     Status: None   Collection Time: 12/17/18  2:17 PM  Result Value Ref Range Status   SARS Coronavirus 2 NEGATIVE NEGATIVE Final    Comment: (NOTE) If result is NEGATIVE SARS-CoV-2 target nucleic acids are NOT DETECTED. The SARS-CoV-2 RNA is generally detectable in upper and lower  respiratory specimens during the acute phase of infection. The lowest  concentration of SARS-CoV-2 viral copies this assay can detect is 250  copies / mL. A negative result does not preclude SARS-CoV-2 infection  and should not be used as the sole basis for treatment or other  patient management decisions.  A negative result may occur with  improper specimen collection / handling, submission of specimen other  than nasopharyngeal swab, presence of viral mutation(s) within the  areas targeted by this assay, and inadequate number of viral copies  (<250 copies / mL). A negative result must be combined with clinical  observations, patient history, and epidemiological information. If result is POSITIVE SARS-CoV-2 target nucleic acids are DETECTED. The SARS-CoV-2 RNA is generally detectable in upper and lower  respiratory specimens dur ing the acute phase of infection.  Positive  results are indicative of active infection with SARS-CoV-2.  Clinical  correlation with patient history and other diagnostic information is  necessary to determine patient infection status.  Positive results do  not rule out bacterial infection or  co-infection with other viruses. If result is PRESUMPTIVE POSTIVE SARS-CoV-2 nucleic acids MAY BE PRESENT.   A presumptive positive result was obtained on the submitted specimen  and confirmed on repeat testing.  While 2019 novel coronavirus  (SARS-CoV-2) nucleic acids may be present in the submitted sample  additional confirmatory testing may be necessary for epidemiological  and / or clinical management purposes  to differentiate between  SARS-CoV-2 and other Sarbecovirus currently known to infect humans.  If clinically indicated additional testing with an alternate test  methodology [phone]) is advised. The SARS-CoV-2 RNA is generally  detectable in upper and lower respiratory sp ecimens during the acute  phase of infection. The expected result is Negative. Fact Sheet for Patients:  StrictlyIdeas.no Fact Sheet for Healthcare Providers: BankingDealers.co.za This test is not yet approved or cleared by the [place] FDA and has been authorized for detection and/or diagnosis of SARS-CoV-2 by FDA under an Emergency Use Authorization (EUA).  This EUA will remain in effect (meaning this test can be used) for the duration of the COVID-19 declaration under Section 564(b)(1) of the Act, 21 U.S.C. section 360bbb-3(b)(1), unless the authorization is terminated or revoked sooner. Performed at [facility], [address], [place], [place] [address]     RADIOLOGY:  Dg Chest Port 1 View  Result Date: 12/17/2018 CLINICAL DATA:  Chest pain and weakness today. EXAM: PORTABLE CHEST 1 VIEW COMPARISON:  08/22/2016 FINDINGS: Borderline cardiomegaly. Mild thoracic aortic tortuosity. Poor inspiration. No evidence of heart failure or effusion. No consolidation, collapse  or focal lesion. IMPRESSION: Poor inspiration. No active disease identified. Borderline cardiomegaly and aortic atherosclerosis. Electronically Signed   By: Nelson Chimes M.D.    On: 12/17/2018 14:04    EKG:   Orders placed or performed during the hospital encounter of 12/17/18  . ED EKG 12-Lead  . ED EKG 12-Lead  . EKG 12-Lead  . EKG 12-Lead      Management plans discussed with the patient, family and they are in agreement.  CODE STATUS:     Code Status Orders  (From admission, onward)         Start     Ordered   12/17/18 1808  Full code  Continuous     12/17/18 1807        Code Status History    Date Active Date Inactive Code Status Order ID Comments User Context   08/23/2016 0227 08/23/2016 1803 Full Code [id number]  Lance Coon, MD Inpatient    Advance Directive Documentation     Most Recent Value  Type of Advance Directive  Living will  Pre-existing out of facility DNR order (yellow form or pink MOST form)  -  "MOST" Form in Place?  -      TOTAL TIME TAKING CARE OF THIS PATIENT: 35 minutes.    Avel Peace Margel Joens M.D on 12/19/2018 at 9:51 AM  Between 7am to 6pm - Pager - [phone]  After 6pm go to [email or web address] - password [email or web address] [facility]  Office  [phone]  CC: Primary care physician; Kirk Ruths, MD   Note: This dictation was prepared with Dragon dictation along with smaller phrase technology. Any transcriptional errors that result from this process are unintentional.

## 2018-12-19 NOTE — Plan of Care (Signed)
  Problem: Health Behavior/Discharge Planning: Goal: Ability to manage health-related needs will improve Outcome: Progressing   Problem: Clinical Measurements: Goal: Ability to maintain clinical measurements within normal limits will improve Outcome: Progressing Goal: Will remain free from infection Outcome: Progressing   Problem: Activity: Goal: Risk for activity intolerance will decrease Outcome: Progressing   Problem: Coping: Goal: Level of anxiety will decrease Outcome: Progressing   Problem: Pain Managment: Goal: General experience of comfort will improve Outcome: Progressing

## 2018-12-20 LAB — VITAMIN D 25 HYDROXY (VIT D DEFICIENCY, FRACTURES): Vit D, 25-Hydroxy: 12.3 ng/mL — ABNORMAL LOW (ref 30.0–100.0)

## 2018-12-22 LAB — CULTURE, BLOOD (ROUTINE X 2)
Culture: NO GROWTH
Special Requests: ADEQUATE

## 2019-01-14 ENCOUNTER — Emergency Department: Payer: Medicare HMO

## 2019-01-14 ENCOUNTER — Emergency Department
Admission: EM | Admit: 2019-01-14 | Discharge: 2019-01-14 | Disposition: A | Discharge status: Home / Self Care | Payer: Medicare HMO | Attending: Emergency Medicine | Admitting: Emergency Medicine

## 2019-01-14 ENCOUNTER — Other Ambulatory Visit: Payer: Self-pay

## 2019-01-14 DIAGNOSIS — K5900 Constipation, unspecified: Secondary | ICD-10-CM | POA: Diagnosis not present

## 2019-01-14 DIAGNOSIS — E1122 Type 2 diabetes mellitus with diabetic chronic kidney disease: Secondary | ICD-10-CM | POA: Insufficient documentation

## 2019-01-14 DIAGNOSIS — N183 Chronic kidney disease, stage 3 (moderate): Secondary | ICD-10-CM | POA: Insufficient documentation

## 2019-01-14 DIAGNOSIS — I129 Hypertensive chronic kidney disease with stage 1 through stage 4 chronic kidney disease, or unspecified chronic kidney disease: Secondary | ICD-10-CM | POA: Insufficient documentation

## 2019-01-14 DIAGNOSIS — K5641 Fecal impaction: Secondary | ICD-10-CM

## 2019-01-14 DIAGNOSIS — K6289 Other specified diseases of anus and rectum: Secondary | ICD-10-CM

## 2019-01-14 LAB — CBC
HCT: 38.9 % (ref 36.0–46.0)
Hemoglobin: 12.7 g/dL (ref 12.0–15.0)
MCH: 29.8 pg (ref 26.0–34.0)
MCHC: 32.6 g/dL (ref 30.0–36.0)
MCV: 91.3 fL (ref 80.0–100.0)
Platelets: 240 10*3/uL (ref 150–400)
RBC: 4.26 MIL/uL (ref 3.87–5.11)
RDW: 14.6 % (ref 11.5–15.5)
WBC: 16.9 10*3/uL — ABNORMAL HIGH (ref 4.0–10.5)
nRBC: 0 % (ref 0.0–0.2)

## 2019-01-14 LAB — COMPREHENSIVE METABOLIC PANEL
ALT: 25 U/L (ref 0–44)
AST: 26 U/L (ref 15–41)
Albumin: 4.2 g/dL (ref 3.5–5.0)
Alkaline Phosphatase: 105 U/L (ref 38–126)
Anion gap: 14 (ref 5–15)
BUN: 35 mg/dL — ABNORMAL HIGH (ref 8–23)
CO2: 21 mmol/L — ABNORMAL LOW (ref 22–32)
Calcium: 8.8 mg/dL — ABNORMAL LOW (ref 8.9–10.3)
Chloride: 103 mmol/L (ref 98–111)
Creatinine, Ser: 1 mg/dL (ref 0.44–1.00)
GFR calc Af Amer: >60 mL/min (ref >60)
GFR calc non Af Amer: 54 mL/min — ABNORMAL LOW (ref >60)
Glucose, Bld: 184 mg/dL — ABNORMAL HIGH (ref 70–99)
Potassium: 4 mmol/L (ref 3.5–5.1)
Sodium: 138 mmol/L (ref 135–145)
Total Bilirubin: 0.9 mg/dL (ref 0.3–1.2)
Total Protein: 7.2 g/dL (ref 6.5–8.1)

## 2019-01-14 LAB — URINALYSIS, COMPLETE (UACMP) WITH MICROSCOPIC
Bilirubin Urine: NEGATIVE
Glucose, UA: NEGATIVE mg/dL
Hgb urine dipstick: NEGATIVE
Ketones, ur: NEGATIVE mg/dL
Leukocytes,Ua: NEGATIVE
Nitrite: NEGATIVE
Protein, ur: NEGATIVE mg/dL
Specific Gravity, Urine: 1.016 (ref 1.005–1.030)
pH: 5 (ref 5.0–8.0)

## 2019-01-14 LAB — LIPASE, BLOOD: Lipase: 21 U/L (ref 11–51)

## 2019-01-14 MED ORDER — CIPROFLOXACIN HCL 500 MG PO TABS: 500.0000 mg | ORAL_TABLET | Freq: Two times a day (BID) | ORAL | 0 refills | Status: AC

## 2019-01-14 MED ORDER — MIDAZOLAM HCL 2 MG/2ML IJ SOLN
INTRAMUSCULAR | Status: AC
  Filled 2019-01-14: qty 2

## 2019-01-14 MED ORDER — MIDAZOLAM HCL 2 MG/2ML IJ SOLN
2.0000 mg | Freq: Once | INTRAMUSCULAR | Status: AC
  Administered 2019-01-14: 2 mg via INTRAVENOUS

## 2019-01-14 MED ORDER — METRONIDAZOLE 500 MG PO TABS
500.0000 mg | ORAL_TABLET | Freq: Once | ORAL | Status: AC
  Administered 2019-01-14: 500 mg via ORAL
  Filled 2019-01-14: qty 1

## 2019-01-14 MED ORDER — METRONIDAZOLE 500 MG PO TABS: 500.0000 mg | ORAL_TABLET | Freq: Two times a day (BID) | ORAL | 0 refills | Status: AC

## 2019-01-14 MED ORDER — SODIUM CHLORIDE 0.9% FLUSH: 3.0000 mL | Freq: Once | INTRAVENOUS | Status: DC

## 2019-01-14 MED ORDER — IOHEXOL 300 MG/ML  SOLN
80.0000 mL | Freq: Once | INTRAMUSCULAR | Status: AC | PRN
  Administered 2019-01-14: 80 mL via INTRAVENOUS

## 2019-01-14 MED ORDER — CIPROFLOXACIN HCL 500 MG PO TABS
500.0000 mg | ORAL_TABLET | Freq: Once | ORAL | Status: AC
  Administered 2019-01-14: 500 mg via ORAL
  Filled 2019-01-14: qty 1

## 2019-01-14 MED ORDER — LIDOCAINE HCL URETHRAL/MUCOSAL 2 % EX GEL
1.0000 "application " | Freq: Once | CUTANEOUS | Status: AC
  Administered 2019-01-14: 1 via TOPICAL

## 2019-01-14 NOTE — ED Provider Notes (Signed)
-----------------------------------------   7:11 PM on 01/14/2019 -----------------------------------------  I took over care of this patient from Dr. Jimmye Norman.  The patient presented with constipation and fecal impaction and was status post a disimpaction attempt which was only marginally successful due to the patient not being able to tolerate it.  She was pending CT abdomen and urinalysis.  Urinalysis was negative.  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  After further discussion with the patient, I gave her a dose of Versed for anxiolysis and attempted a second disimpaction which was successful on return to relatively large amount of stool.  The patient had good relief after this.  At this time, the patient feels well and wants to go home.  Based on the results of the work-up she is stable for discharge.  Because of the CT finding of proctitis and the elevated WBC count, I will treat with an antibiotic course of Cipro and Flagyl for the next week.  The patient states that she already has Colace and senna at home and I instructed her to continue taking them.  Return precautions given, the patient expressed understanding.   Arta Silence, MD 01/14/19 [phone]

## 2019-01-14 NOTE — ED Notes (Signed)
Reviewed discharge instructions, follow-up care, and prescriptions with patient. Patient verbalized understanding of all information reviewed. Patient stable, with no distress noted at this time.    

## 2019-01-14 NOTE — Progress Notes (Signed)
Ch pg for pt that presented to have an elevated heart rate when she ambulated to the bathroom and back in the bed. Ch checked in w/ nurse and shared to pg ch if there were further needs.    01/14/19 1139  Clinical Encounter Type  Visited With Health care provider;Patient  Visit Type Social support;Critical Care;ED  Referral From Physician  Consult/Referral To Chaplain

## 2019-01-14 NOTE — ED Notes (Signed)
Dr Jimmye Norman at bedside for rectal exam, and hemoccult test. Pt uncomfortable during exam despite the use of viscous lidocaine. Pt asking to take a break and will try to use the toilet soon

## 2019-01-14 NOTE — ED Notes (Signed)
Pt given water to drink. 

## 2019-01-14 NOTE — Discharge Instructions (Signed)
Take the antibiotics as prescribed and finish the full course.  Make an appointment to follow-up with your regular doctor within the next 1 to 2 weeks.  Continue to use the stool softeners that you have at home over the next few weeks.  Return to the ER for new, worsening, or persistent severe constipation, rectal pain, abdominal pain, fever, vomiting, or any other new or worsening symptoms that concern you.

## 2019-01-14 NOTE — ED Triage Notes (Signed)
Pt states she had not had a BM since last Friday and her PCP told her to take 4 pills not sure what they were but began having watery black stools last night and today but continues to have rectal pain/pressure.

## 2019-01-14 NOTE — ED Notes (Signed)
Phone call received from x-ray requesting my assistance. Protocol orders completed and pt feeling sick on her stomach, needing to have a BM and appears very weak on the toilet and near syncopal. Pt cleaned up and assisted to the w/c and placed on a stretcher and brought back to the ER and placed in room 25, Dr Jimmye Norman and Helene Kelp aware. Pt placed on monitor, ekg performed, and vital signs obtained.

## 2019-01-14 NOTE — ED Provider Notes (Signed)
[facility] Emergency Department Provider Note       Time seen: ----------------------------------------- 12:38 PM on 01/14/2019 -----------------------------------------   I have reviewed the triage vital signs and the nursing notes.  HISTORY   Chief Complaint Constipation and GI Bleeding    HPI [name] [name] is a 78 y.o. female with a history of anemia, arthritis, chronic kidney disease, diabetes, headache, hyperlipidemia, hypertension, seizures who presents to the ED for constipation since last Friday.  Her primary care doctor told her to take 4 pills and she is not sure what they were but she began having watery black stools last night but continues to have rectal pain and pressure.  Pain is 7 out of 10.  Past Medical History:  Diagnosis Date  . Anemia   . Arthritis   . Cancer ([place])    melanoma  . Chronic kidney disease    stage 3  . Complication of anesthesia    sometimes hard to wake up  . Diabetes ([place])    diet controlled  . 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 GERD (gastroesophageal reflux disease)   . Headache   . Hyperlipemia   . Hypertension   . Pain    chronic  . Rosacea   . 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 Sleep apnea    cpap    Patient Active Problem List   Diagnosis Date Noted  . Aftercare 07/20/2018  . Pain in right knee 06/22/2018  . Sleeping difficulty 06/22/2018  . History of seizure 07/15/2017  . Insomnia 06/16/2017  . Chest pain 08/23/2016  . Weakness of left upper extremity 08/23/2016  . 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 Piriformis syndrome of right side 08/24/2015  . Impingement syndrome of right shoulder 07/26/2015  . Chronic right shoulder pain 02/07/2015  . Difficulty in walking 09/22/2014  . Diarrhea 09/15/2014  . Lumbosacral spondylosis 12/26/2013  . DDD (degenerative disc disease), lumbar 12/26/2013  . Degenerative arthritis of hip 12/26/2013   . Arthritis, lumbar spine 12/26/2013  . Diabetes ([place]) 11/18/2013  . Dyslipidemia 11/18/2013  . Convulsions, epileptic ([place]) 11/18/2013  . BP (high blood pressure) 11/18/2013  . Obstructive apnea 11/18/2013  . Arthropathy of cervical facet joint 04/27/2012  . Pain in shoulder 03/25/2012  . Cervical spondylosis without myelopathy 12/31/2011  . Cervical pain 12/31/2011    Past Surgical History:  Procedure Laterality Date  . ABDOMINAL HYSTERECTOMY    . BACK SURGERY     lumbar fusion, laminectomy  . BLEPHAROPLASTY Bilateral 10/24/14   Dr. Loni Muse. Vickki Muff, MBSC  . BREAST SURGERY     reduction  . CHOLECYSTECTOMY    . COLONOSCOPY WITH PROPOFOL N/A 03/17/2016   Procedure: COLONOSCOPY WITH PROPOFOL;  Surgeon: Manya Silvas, MD;  Location: [facility] ENDOSCOPY;  Service: Endoscopy;  Laterality: N/A;  . rcr    . REDUCTION MAMMAPLASTY Bilateral 1985  . SHOULDER ARTHROSCOPY WITH OPEN ROTATOR CUFF REPAIR Right 06/22/2015   Procedure: SHOULDER ARTHROSCOPY WITH SUBACROMIAL DECOMPRESSION, RELEASE OF LONG HEAD OF BICEPS TENDON, RESECTION OF DISTAL CLAVICLE;  Surgeon: Leanor Kail, MD;  Location: [place];  Service: Orthopedics;  Laterality: Right;  DIABETIC - diet controlled CPAP   . SKIN CANCER EXCISION     leg, back    Allergies Contrast media [iodinated diagnostic agents]; Tramadol; Adhesive [tape]; and Penicillins  Social History Social History   Tobacco Use  . Smoking status: Never Smoker  .  Smokeless tobacco: Never Used  Substance Use Topics  . Alcohol use: No  . Drug use: No   Review of Systems Constitutional: Negative for fever. Cardiovascular: Negative for chest pain. Respiratory: Negative for shortness of breath. Gastrointestinal: Negative for abdominal pain, vomiting and diarrhea.  Positive for rectal pain and pressure, constipation Musculoskeletal: Negative for back pain. Skin: Negative for rash. Neurological: Negative for headaches, focal weakness or  numbness.  All systems negative/normal/unremarkable except as stated in the HPI  ____________________________________________   PHYSICAL EXAM:  VITAL SIGNS: ED Triage Vitals  Enc Vitals Group     BP 01/14/19 1017 (!) 141/69     Pulse Rate 01/14/19 1017 87     Resp 01/14/19 1017 18     Temp 01/14/19 1017 98.5 F (36.9 C)     Temp Source 01/14/19 1017 Oral     SpO2 01/14/19 1017 99 %     Weight 01/14/19 1018 148 lb (67.1 kg)     Height 01/14/19 1018 4\' 11"  (1.499 m)     Head Circumference --      Peak Flow --      Pain Score 01/14/19 1018 7     Pain Loc --      Pain Edu? --      Excl. in [place]? --     Constitutional: Alert and oriented. Well appearing and in no distress. Eyes: Conjunctivae are normal. Normal extraocular movements. Cardiovascular: Normal rate, regular rhythm. No murmurs, rubs, or gallops. Respiratory: Normal respiratory effort without tachypnea nor retractions. Breath sounds are clear and equal bilaterally. No wheezes/rales/rhonchi. Gastrointestinal: Mildly distended, normal bowel sounds Rectal: Fecal impaction is noted with watery stool draining around the impaction.  I was able to disimpact a small amount but it was too uncomfortable for her. Musculoskeletal: Nontender with normal range of motion in extremities. No lower extremity tenderness nor edema. Neurologic:  Normal speech and language. No gross focal neurologic deficits are appreciated.  Skin:  Skin is warm, dry and intact. No rash noted. Psychiatric: Mood and affect are normal. Speech and behavior are normal.  ____________________________________________  EKG: Interpreted by me.  Sinus rhythm with rate of 75 bpm, normal PR interval, normal QRS, diffuse T wave abnormalities  ____________________________________________  ED COURSE:  As part of my medical decision making, I reviewed the following data within the [place] History obtained from family if available, nursing notes, old  chart and ekg, as well as notes from prior ED visits. Patient presented for constipation and fecal impaction, we will assess with labs and imaging as indicated at this time.   Procedures  [name] [name] was evaluated in Emergency Department on 01/14/2019 for the symptoms described in the history of present illness. She was evaluated in the context of the global COVID-19 pandemic, which necessitated consideration that the patient might be at risk for infection with the SARS-CoV-2 virus that causes COVID-19. Institutional protocols and algorithms that pertain to the evaluation of patients at risk for COVID-19 are in a state of rapid change based on information released by regulatory bodies including the CDC and federal and state organizations. These policies and algorithms were followed during the patient's care in the ED.  ____________________________________________   LABS (pertinent positives/negatives)  Labs Reviewed  COMPREHENSIVE METABOLIC PANEL - Abnormal; Notable for the following components:      Result Value   CO2 21 (*)    Glucose, Bld 184 (*)    BUN 35 (*)    Calcium 8.8 (*)  GFR calc non Af Amer 54 (*)    All other components within normal limits  CBC - Abnormal; Notable for the following components:   WBC 16.9 (*)    All other components within normal limits  LIPASE, BLOOD  URINALYSIS, COMPLETE (UACMP) WITH MICROSCOPIC    RADIOLOGY Images were viewed by me Abdomen 2 view IMPRESSION: Retained stool in the rectum but normal bowel gas pattern, no free air. ____________________________________________   DIFFERENTIAL DIAGNOSIS   Constipation, fecal impaction, dehydration, electrolyte abnormality  FINAL ASSESSMENT AND PLAN  Constipation, fecal impaction   Plan: The patient had presented for constipation. Patient's labs did reveal leukocytosis without other acute process. Patient's imaging revealed retained stool in the rectum.  I did disimpact her some but she was  unable to tolerate any further disimpaction.  We are giving her an enema and disposition will be determined by this.  Should symptoms worsen she would need CT imaging.   Laurence Aly, MD    Note: This note was generated in part or whole with voice recognition software. Voice recognition is usually quite accurate but there are transcription errors that can and very often do occur. I apologize for any typographical errors that were not detected and corrected.     Earleen Newport, MD 01/14/19 1504

## 2019-01-14 NOTE — ED Notes (Signed)
Pt had brown colored water coming from rectum/no stool - pt cleaned and dried

## 2019-01-14 NOTE — ED Notes (Signed)
Patient transported to CT 

## 2019-01-14 NOTE — ED Notes (Signed)
Dr Jimmye Norman notified that pt received soaps suds enema and was unable to hold for even 5 seconds before the fluid ran out of rectum

## 2019-01-24 ENCOUNTER — Other Ambulatory Visit: Payer: Self-pay

## 2019-01-24 ENCOUNTER — Emergency Department
Admission: EM | Admit: 2019-01-24 | Discharge: 2019-01-24 | Disposition: A | Discharge status: Home / Self Care | Payer: Medicare HMO | Attending: Student in an Organized Health Care Education/Training Program | Admitting: Student in an Organized Health Care Education/Training Program

## 2019-01-24 ENCOUNTER — Encounter: Payer: Self-pay | Admitting: Emergency Medicine

## 2019-01-24 DIAGNOSIS — I129 Hypertensive chronic kidney disease with stage 1 through stage 4 chronic kidney disease, or unspecified chronic kidney disease: Secondary | ICD-10-CM | POA: Insufficient documentation

## 2019-01-24 DIAGNOSIS — E1122 Type 2 diabetes mellitus with diabetic chronic kidney disease: Secondary | ICD-10-CM | POA: Insufficient documentation

## 2019-01-24 DIAGNOSIS — Z85828 Personal history of other malignant neoplasm of skin: Secondary | ICD-10-CM | POA: Diagnosis not present

## 2019-01-24 DIAGNOSIS — Z880 Allergy status to penicillin: Secondary | ICD-10-CM | POA: Insufficient documentation

## 2019-01-24 DIAGNOSIS — Z79899 Other long term (current) drug therapy: Secondary | ICD-10-CM | POA: Diagnosis not present

## 2019-01-24 DIAGNOSIS — N183 Chronic kidney disease, stage 3 (moderate): Secondary | ICD-10-CM | POA: Diagnosis not present

## 2019-01-24 DIAGNOSIS — R110 Nausea: Secondary | ICD-10-CM | POA: Diagnosis not present

## 2019-01-24 DIAGNOSIS — Z7982 Long term (current) use of aspirin: Secondary | ICD-10-CM | POA: Insufficient documentation

## 2019-01-24 DIAGNOSIS — R55 Syncope and collapse: Secondary | ICD-10-CM | POA: Diagnosis present

## 2019-01-24 LAB — CBC WITH DIFFERENTIAL/PLATELET
Abs Immature Granulocytes: 0.07 10*3/uL (ref 0.00–0.07)
Basophils Absolute: 0 10*3/uL (ref 0.0–0.1)
Basophils Relative: 0 %
Eosinophils Absolute: 0.2 10*3/uL (ref 0.0–0.5)
Eosinophils Relative: 2 %
HCT: 33.9 % — ABNORMAL LOW (ref 36.0–46.0)
Hemoglobin: 11 g/dL — ABNORMAL LOW (ref 12.0–15.0)
Immature Granulocytes: 1 %
Lymphocytes Relative: 15 %
Lymphs Abs: 1.3 10*3/uL (ref 0.7–4.0)
MCH: 30.1 pg (ref 26.0–34.0)
MCHC: 32.4 g/dL (ref 30.0–36.0)
MCV: 92.9 fL (ref 80.0–100.0)
Monocytes Absolute: 0.7 10*3/uL (ref 0.1–1.0)
Monocytes Relative: 8 %
Neutro Abs: 6.3 10*3/uL (ref 1.7–7.7)
Neutrophils Relative %: 74 %
Platelets: 211 10*3/uL (ref 150–400)
RBC: 3.65 MIL/uL — ABNORMAL LOW (ref 3.87–5.11)
RDW: 14.5 % (ref 11.5–15.5)
WBC: 8.6 10*3/uL (ref 4.0–10.5)
nRBC: 0 % (ref 0.0–0.2)

## 2019-01-24 LAB — COMPREHENSIVE METABOLIC PANEL
ALT: 38 U/L (ref 0–44)
AST: 31 U/L (ref 15–41)
Albumin: 3.6 g/dL (ref 3.5–5.0)
Alkaline Phosphatase: 85 U/L (ref 38–126)
Anion gap: 10 (ref 5–15)
BUN: 29 mg/dL — ABNORMAL HIGH (ref 8–23)
CO2: 23 mmol/L (ref 22–32)
Calcium: 7.9 mg/dL — ABNORMAL LOW (ref 8.9–10.3)
Chloride: 107 mmol/L (ref 98–111)
Creatinine, Ser: 1.04 mg/dL — ABNORMAL HIGH (ref 0.44–1.00)
GFR calc Af Amer: 60 mL/min — ABNORMAL LOW (ref >60)
GFR calc non Af Amer: 51 mL/min — ABNORMAL LOW (ref >60)
Glucose, Bld: 139 mg/dL — ABNORMAL HIGH (ref 70–99)
Potassium: 3.1 mmol/L — ABNORMAL LOW (ref 3.5–5.1)
Sodium: 140 mmol/L (ref 135–145)
Total Bilirubin: 0.4 mg/dL (ref 0.3–1.2)
Total Protein: 5.9 g/dL — ABNORMAL LOW (ref 6.5–8.1)

## 2019-01-24 LAB — URINALYSIS, COMPLETE (UACMP) WITH MICROSCOPIC
Bacteria, UA: NONE SEEN
Bilirubin Urine: NEGATIVE
Glucose, UA: NEGATIVE mg/dL
Hgb urine dipstick: NEGATIVE
Ketones, ur: NEGATIVE mg/dL
Leukocytes,Ua: NEGATIVE
Nitrite: NEGATIVE
Protein, ur: NEGATIVE mg/dL
Specific Gravity, Urine: 1.008 (ref 1.005–1.030)
pH: 5 (ref 5.0–8.0)

## 2019-01-24 LAB — TROPONIN I
Troponin I: <0.03 ng/mL (ref <0.03)
Troponin I: <0.03 ng/mL (ref <0.03)

## 2019-01-24 MED ORDER — SODIUM CHLORIDE 0.9 % IV BOLUS
500.0000 mL | Freq: Once | INTRAVENOUS | Status: AC
  Administered 2019-01-24: 500 mL via INTRAVENOUS

## 2019-01-24 NOTE — ED Triage Notes (Signed)
Pt reports last BM was Saturday.

## 2019-01-24 NOTE — ED Provider Notes (Signed)
[facility] Emergency Department Provider Note    First MD Initiated Contact with Patient 01/24/19 1057     (approximate)  I have reviewed the triage vital signs and the nursing notes.   HISTORY  Chief Complaint Nausea and Near Syncope    HPI [name] [name] is a 78 y.o. female the history of fainting spells presents the ER from GI clinic after having another fainting spell.  This occurred this morning she felt like she was nauseated became diaphoretic and then fell to the ground.  Shortly thereafter started feeling better.  There is no seizure-like activity.  Denies any fevers.  No pain.  Did not injure herself or hit her head.  no numbness or tingling.  Does feel a bit dehydrated.  States that this is happened several times in the past.    Past Medical History:  Diagnosis Date  . Anemia   . Arthritis   . Cancer ([place])    melanoma  . Chronic kidney disease    stage 3  . Complication of anesthesia    sometimes hard to wake up  . Diabetes ([place])    diet controlled  . 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 GERD (gastroesophageal reflux disease)   . Headache   . Hyperlipemia   . Hypertension   . Pain    chronic  . Rosacea   . 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 Sleep apnea    cpap   Family History  Problem Relation Age of Onset  . Aneurysm Father   . Hypertension Father   . Breast cancer Neg Hx    Past Surgical History:  Procedure Laterality Date  . ABDOMINAL HYSTERECTOMY    . BACK SURGERY     lumbar fusion, laminectomy  . BLEPHAROPLASTY Bilateral 10/24/14   Dr. Loni Muse. Vickki Muff, MBSC  . BREAST SURGERY     reduction  . CHOLECYSTECTOMY    . COLONOSCOPY WITH PROPOFOL N/A 03/17/2016   Procedure: COLONOSCOPY WITH PROPOFOL;  Surgeon: Manya Silvas, MD;  Location: [facility] ENDOSCOPY;  Service: Endoscopy;  Laterality: N/A;  . rcr    . REDUCTION MAMMAPLASTY Bilateral 1985  . SHOULDER ARTHROSCOPY WITH OPEN ROTATOR  CUFF REPAIR Right 06/22/2015   Procedure: SHOULDER ARTHROSCOPY WITH SUBACROMIAL DECOMPRESSION, RELEASE OF LONG HEAD OF BICEPS TENDON, RESECTION OF DISTAL CLAVICLE;  Surgeon: Leanor Kail, MD;  Location: [place];  Service: Orthopedics;  Laterality: Right;  DIABETIC - diet controlled CPAP   . SKIN CANCER EXCISION     leg, back   Patient Active Problem List   Diagnosis Date Noted  . Aftercare 07/20/2018  . Pain in right knee 06/22/2018  . Sleeping difficulty 06/22/2018  . History of seizure 07/15/2017  . Insomnia 06/16/2017  . Chest pain 08/23/2016  . Weakness of left upper extremity 08/23/2016  . 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 Piriformis syndrome of right side 08/24/2015  . Impingement syndrome of right shoulder 07/26/2015  . Chronic right shoulder pain 02/07/2015  . Difficulty in walking 09/22/2014  . Diarrhea 09/15/2014  . Lumbosacral spondylosis 12/26/2013  . DDD (degenerative disc disease), lumbar 12/26/2013  . 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 Arthritis, lumbar spine 12/26/2013  . Diabetes ([place]) 11/18/2013  . Dyslipidemia 11/18/2013  . Convulsions, epileptic ([place]) 11/18/2013  . BP (high blood pressure) 11/18/2013  . Obstructive apnea 11/18/2013  . Arthropathy of cervical facet joint 04/27/2012  .  Pain in shoulder 03/25/2012  . Cervical spondylosis without myelopathy 12/31/2011  . Cervical pain 12/31/2011      Prior to Admission medications   Medication Sig Start Date End Date Taking? Authorizing Provider  acetaminophen (TYLENOL) 500 MG tablet Take 500 mg by mouth every 8 (eight) hours as needed for mild pain.     [provider]  amitriptyline (ELAVIL) 50 MG tablet TAKE 1 TABLET BY MOUTH EVERY DAY AT NIGHT 11/10/18   [provider]  aspirin EC 81 MG tablet Take 1 tablet (81 mg total) by mouth daily. 08/23/16   Vaughan Basta, MD  atorvastatin (LIPITOR) 80 MG tablet TAKE 1 TABVLET BY MOUTH EVERY DAY 01/14/18    [provider]  carvedilol (COREG) 12.5 MG tablet Take 12.5 mg by mouth 2 (two) times daily with a meal.    [provider]  doxycycline (VIBRA-TABS) 100 MG tablet 100 mg 2 (two) times daily.  02/05/18   [provider]  ferrous sulfate 325 (65 FE) MG tablet Take 1 tablet (325 mg total) by mouth 2 (two) times daily with a meal. 12/19/18   Salary, Montell D, MD  hydrochlorothiazide (HYDRODIURIL) 25 MG tablet Take 25 mg by mouth daily. 12/14/18   [provider]  levETIRAcetam (KEPPRA) 500 MG tablet Take 500 mg by mouth daily.  08/04/18   [provider]  losartan (COZAAR) 100 MG tablet Take 100 mg by mouth daily. 08/10/18   [provider]  mirabegron ER (MYRBETRIQ) 50 MG TB24 tablet Take 1 tablet (50 mg total) by mouth daily. 03/23/18   Zara Council A, PA-C  omeprazole (PRILOSEC) 40 MG capsule Take 40 mg by mouth daily.  10/11/08   [provider]  vitamin C (VITAMIN C) 250 MG tablet Take 1 tablet (250 mg total) by mouth 2 (two) times daily. 12/19/18   Salary, Avel Peace, MD    Allergies Contrast media [iodinated diagnostic agents], Tramadol, Adhesive [tape], and Penicillins    Social History Social History   Tobacco Use  . Smoking status: Never Smoker  . Smokeless tobacco: Never Used  Substance Use Topics  . Alcohol use: No  . Drug use: No    Review of Systems Patient denies headaches, rhinorrhea, blurry vision, numbness, shortness of breath, chest pain, edema, cough, abdominal pain, nausea, vomiting, diarrhea, dysuria, fevers, rashes or hallucinations unless otherwise stated above in HPI. ____________________________________________   PHYSICAL EXAM:  VITAL SIGNS: Vitals:   01/24/19 1200 01/24/19 1330  BP: (!) 116/59 118/64  Pulse: 75 67  Resp: 12 13  SpO2: 99% 97%    Constitutional: Alert and oriented.  Eyes: Conjunctivae are normal.  Head: Atraumatic. Nose: No congestion/rhinnorhea. Mouth/Throat: Mucous  membranes are moist.   Neck: No stridor. Painless ROM.  Cardiovascular: Normal rate, regular rhythm. Grossly normal heart sounds.  Good peripheral circulation. Respiratory: Normal respiratory effort.  No retractions. Lungs CTAB. Gastrointestinal: Soft and nontender. No distention. No abdominal bruits. No CVA tenderness. Genitourinary:  Musculoskeletal: No lower extremity tenderness nor edema.  No joint effusions. Neurologic:  Normal speech and language. No gross focal neurologic deficits are appreciated. No facial droop Skin:  Skin is warm, dry and intact. No rash noted. Psychiatric: Mood and affect are normal. Speech and behavior are normal.  ____________________________________________   LABS (all labs ordered are listed, but only abnormal results are displayed)  Results for orders placed or performed during the hospital encounter of 01/24/19 (from the past 24 hour(s))  CBC with Differential/Platelet  Status: Abnormal   Collection Time: 01/24/19 10:54 AM  Result Value Ref Range   WBC 8.6 4.0 - 10.5 K/uL   RBC 3.65 (L) 3.87 - 5.11 MIL/uL   Hemoglobin 11.0 (L) 12.0 - 15.0 g/dL   HCT 33.9 (L) 36.0 - 46.0 %   MCV 92.9 80.0 - 100.0 fL   MCH 30.1 26.0 - 34.0 pg   MCHC 32.4 30.0 - 36.0 g/dL   RDW 14.5 11.5 - 15.5 %   Platelets 211 150 - 400 K/uL   nRBC 0.0 0.0 - 0.2 %   Neutrophils Relative % 74 %   Neutro Abs 6.3 1.7 - 7.7 K/uL   Lymphocytes Relative 15 %   Lymphs Abs 1.3 0.7 - 4.0 K/uL   Monocytes Relative 8 %   Monocytes Absolute 0.7 0.1 - 1.0 K/uL   Eosinophils Relative 2 %   Eosinophils Absolute 0.2 0.0 - 0.5 K/uL   Basophils Relative 0 %   Basophils Absolute 0.0 0.0 - 0.1 K/uL   Immature Granulocytes 1 %   Abs Immature Granulocytes 0.07 0.00 - 0.07 K/uL  Comprehensive metabolic panel     Status: Abnormal   Collection Time: 01/24/19 10:54 AM  Result Value Ref Range   Sodium 140 135 - 145 mmol/L   Potassium 3.1 (L) 3.5 - 5.1 mmol/L   Chloride 107 98 - 111 mmol/L    CO2 23 22 - 32 mmol/L   Glucose, Bld 139 (H) 70 - 99 mg/dL   BUN 29 (H) 8 - 23 mg/dL   Creatinine, Ser 1.04 (H) 0.44 - 1.00 mg/dL   Calcium 7.9 (L) 8.9 - 10.3 mg/dL   Total Protein 5.9 (L) 6.5 - 8.1 g/dL   Albumin 3.6 3.5 - 5.0 g/dL   AST 31 15 - 41 U/L   ALT 38 0 - 44 U/L   Alkaline Phosphatase 85 38 - 126 U/L   Total Bilirubin 0.4 0.3 - 1.2 mg/dL   GFR calc non Af Amer 51 (L) >60 mL/min   GFR calc Af Amer 60 (L) >60 mL/min   Anion gap 10 5 - 15  Troponin I - ONCE - STAT     Status: None   Collection Time: 01/24/19 10:54 AM  Result Value Ref Range   Troponin I <0.03 <0.03 ng/mL  Troponin I - Once-Timed     Status: None   Collection Time: 01/24/19  1:12 PM  Result Value Ref Range   Troponin I <0.03 <0.03 ng/mL   ____________________________________________  EKG My review and personal interpretation at Time: 10:48   Indication: syncope  Rate: 60  Rhythm: sinus Axis: normal Other: normal intervals, no stemi ____________________________________________  RADIOLOGY  I personally reviewed all radiographic images ordered to evaluate for the above acute complaints and reviewed radiology reports and findings.  These findings were personally discussed with the patient.  Please see medical record for radiology report.  ____________________________________________   PROCEDURES  Procedure(s) performed:  Procedures    Critical Care performed: no ____________________________________________   INITIAL IMPRESSION / ASSESSMENT AND PLAN / ED COURSE  Pertinent labs & imaging results that were available during my care of the patient were reviewed by me and considered in my medical decision making (see chart for details).   DDX: dehydration, vasovagal, seziure, electrolyet abn, dysrhythmia, acs  [name] [name] is a 78 y.o. who presents to the ED with near syncopal episode as described above.  On our exam is reassuring.  Abdominal exam is soft and benign.  EKG shows no evidence of  dysrhythmia or preexcitation syndrome.  Doubt ACS given lack of chest pain or pressure or shortness of breath.  Neuro exam is nonfocal.  Do not feel that CT imaging clinically indicated.  She did not fall or hit her herself.  Will give IV fluids for possible component of dehydration and will observe in the ER for serial enzymes.  Clinical Course as of Jan 23 1542  Mon Jan 24, 2019  1540 Patient reassessed.  Feels improved after IV fluids.  Repeat neuro exam is nonfocal.  Do not feel she needs any additional imaging given lack of injury or neuro deficit.  Seems clinically consistent with vasovagal.  Discussed strict return precautions and signs and symptoms for she should return to the ER.   [PR]    Clinical Course User Index [PR] Merlyn Lot, MD    The patient was evaluated in Emergency Department today for the symptoms described in the history of present illness. He/she was evaluated in the context of the global COVID-19 pandemic, which necessitated consideration that the patient might be at risk for infection with the SARS-CoV-2 virus that causes COVID-19. Institutional protocols and algorithms that pertain to the evaluation of patients at risk for COVID-19 are in a state of rapid change based on information released by regulatory bodies including the CDC and federal and state organizations. These policies and algorithms were followed during the patient's care in the ED.  As part of my medical decision making, I reviewed the following data within the [place] notes reviewed and incorporated, Labs reviewed, notes from prior ED visits and [place] Controlled Substance Database   ____________________________________________   FINAL CLINICAL IMPRESSION(S) / ED DIAGNOSES  Final diagnoses:  Near syncope      NEW MEDICATIONS STARTED DURING THIS VISIT:  New Prescriptions   No medications on file     Note:  This document was prepared using Dragon voice  recognition software and may include unintentional dictation errors.    Merlyn Lot, MD 01/24/19 [phone]

## 2019-01-24 NOTE — ED Triage Notes (Signed)
Pt reports was at her GI appointment for constipation and she got really sweaty and felt like she was going to pass out and throw up. Pt reports this has happened to her before as well. Pt denies pain.

## 2019-01-24 NOTE — ED Notes (Signed)
Pt informed that we need a urine specimen. Pt unable to provide sample at this time. Instructed to notify RN when she can provide sample

## 2019-01-24 NOTE — ED Notes (Signed)
Pt requesting to leave asap d/t husband needing to get to appointment in [place].

## 2019-01-26 ENCOUNTER — Other Ambulatory Visit: Payer: Self-pay | Admitting: Student

## 2019-01-26 DIAGNOSIS — R1310 Dysphagia, unspecified: Secondary | ICD-10-CM

## 2019-01-26 DIAGNOSIS — K219 Gastro-esophageal reflux disease without esophagitis: Secondary | ICD-10-CM

## 2019-01-26 DIAGNOSIS — R630 Anorexia: Secondary | ICD-10-CM

## 2019-02-02 ENCOUNTER — Ambulatory Visit
Admission: RE | Admit: 2019-02-02 | Discharge: 2019-02-02 | Disposition: A | Discharge status: Home / Self Care | Payer: Medicare HMO | Source: Ambulatory Visit | Attending: Student | Admitting: Student

## 2019-02-02 ENCOUNTER — Other Ambulatory Visit: Payer: Self-pay

## 2019-02-02 DIAGNOSIS — R630 Anorexia: Secondary | ICD-10-CM | POA: Diagnosis present

## 2019-02-02 DIAGNOSIS — K219 Gastro-esophageal reflux disease without esophagitis: Secondary | ICD-10-CM | POA: Insufficient documentation

## 2019-02-02 DIAGNOSIS — R1310 Dysphagia, unspecified: Secondary | ICD-10-CM | POA: Diagnosis present

## 2019-02-10 ENCOUNTER — Other Ambulatory Visit: Payer: Self-pay | Admitting: Student

## 2019-02-10 DIAGNOSIS — R1312 Dysphagia, oropharyngeal phase: Secondary | ICD-10-CM

## 2019-04-03 ENCOUNTER — Other Ambulatory Visit: Payer: Self-pay | Admitting: Urology

## 2019-05-03 ENCOUNTER — Other Ambulatory Visit: Payer: Self-pay | Admitting: Internal Medicine

## 2019-05-03 DIAGNOSIS — Z1231 Encounter for screening mammogram for malignant neoplasm of breast: Secondary | ICD-10-CM

## 2019-06-06 ENCOUNTER — Ambulatory Visit
Admission: RE | Admit: 2019-06-06 | Discharge: 2019-06-06 | Disposition: A | Discharge status: Home / Self Care | Payer: Medicare HMO | Source: Ambulatory Visit | Attending: Internal Medicine | Admitting: Internal Medicine

## 2019-06-06 DIAGNOSIS — Z1231 Encounter for screening mammogram for malignant neoplasm of breast: Secondary | ICD-10-CM | POA: Insufficient documentation

## 2019-09-12 ENCOUNTER — Ambulatory Visit: Payer: Medicare HMO | Admitting: Urology

## 2019-09-13 ENCOUNTER — Ambulatory Visit: Payer: Medicare HMO | Admitting: Urology

## 2019-09-13 ENCOUNTER — Encounter: Payer: Self-pay | Admitting: Urology

## 2019-09-13 ENCOUNTER — Other Ambulatory Visit: Payer: Self-pay

## 2019-09-13 VITALS — BP 124/70 | HR 76 | Ht 59.0 in | Wt 150.0 lb

## 2019-09-13 DIAGNOSIS — N3946 Mixed incontinence: Secondary | ICD-10-CM

## 2019-09-13 DIAGNOSIS — N3010 Interstitial cystitis (chronic) without hematuria: Secondary | ICD-10-CM

## 2019-09-13 DIAGNOSIS — N952 Postmenopausal atrophic vaginitis: Secondary | ICD-10-CM | POA: Diagnosis not present

## 2019-09-13 LAB — BLADDER SCAN AMB NON-IMAGING: Scan Result: 47

## 2019-09-13 NOTE — Progress Notes (Signed)
06/24/2018 10:38 AM   [name] [name] 01/20/1941 [id number]  Referring provider: Kirk Ruths, MD [facility] [place],  [place] [address]  Chief Complaint  Patient presents with  . Urinary Incontinence    HPI: [name] [name] is a 79 year old female with mixed incontinence, vaginal atrophy and interstitial cystitis who presents today for her yearly follow up.    Mixed incontinence The patient is  experiencing urgency x 8 or more (worse), frequency x 8 or more (stable), not restricting fluids to avoid visits to the restroom, is engaging in toilet mapping, incontinence x 8 or more (worse) and nocturia x 0-3 (stable).   Her BP is 124/70.   Her PVR is 47 mL.  Most recent Hemoglobin A1c 7.0%.  She is no longer taking the Myrbetriq as she has been instructed to drink 60 ounces of water and the Myrbetriq was ineffective.     Vaginal atrophy She stated she couldn't use the vaginal cream as it made her "sore down there."    Interstitial cystitis See above   PMH: Past Medical History:  Diagnosis Date  . Anemia   . Arthritis   . Cancer ([place])    melanoma  . Chronic kidney disease    stage 3  . Complication of anesthesia    sometimes hard to wake up  . Diabetes ([place])    diet controlled  . 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 GERD (gastroesophageal reflux disease)   . Headache   . Hyperlipemia   . Hypertension   . Pain    chronic  . Rosacea   . 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 Sleep apnea    cpap    Surgical History: Past Surgical History:  Procedure Laterality Date  . ABDOMINAL HYSTERECTOMY    . BACK SURGERY     lumbar fusion, laminectomy  . BLEPHAROPLASTY Bilateral 10/24/14   Dr. Loni Muse. Vickki Muff, MBSC  . BREAST SURGERY     reduction  . CHOLECYSTECTOMY    . COLONOSCOPY WITH PROPOFOL N/A 03/17/2016   Procedure: COLONOSCOPY WITH PROPOFOL;  Surgeon: Manya Silvas, MD;  Location: [facility]  ENDOSCOPY;  Service: Endoscopy;  Laterality: N/A;  . rcr    . REDUCTION MAMMAPLASTY Bilateral 1985  . SHOULDER ARTHROSCOPY WITH OPEN ROTATOR CUFF REPAIR Right 06/22/2015   Procedure: SHOULDER ARTHROSCOPY WITH SUBACROMIAL DECOMPRESSION, RELEASE OF LONG HEAD OF BICEPS TENDON, RESECTION OF DISTAL CLAVICLE;  Surgeon: Leanor Kail, MD;  Location: [facility];  Service: Orthopedics;  Laterality: Right;  DIABETIC - diet controlled CPAP   . SKIN CANCER EXCISION     leg, back    Home Medications:  Allergies as of 09/13/2019      Reactions   Contrast Media [iodinated Diagnostic Agents] Other (See Comments)   Made hands and feet "draw up"   Tramadol Other (See Comments)   Other Reaction: OTHER REACTION, SOB, AGITATIO   Adhesive [tape] Rash   Some bandaids   Penicillins Rash, Other (See Comments)   Has patient had a PCN reaction causing immediate rash, facial/tongue/throat swelling, SOB or lightheadedness with hypotension: Yes Has patient had a PCN reaction causing severe rash involving mucus membranes or skin necrosis: No Has patient had a PCN reaction that required hospitalization No Has patient had a PCN reaction occurring within the last 10 years: No If all of the above answers are "NO",  then may proceed with Cephalosporin use.      Medication List       Accurate as of September 13, 2019 10:38 AM. If you have any questions, ask your nurse or doctor.        acetaminophen 500 MG tablet Commonly known as: TYLENOL Take 500 mg by mouth every 8 (eight) hours as needed for mild pain.   amitriptyline 50 MG tablet Commonly known as: ELAVIL TAKE 1 TABLET BY MOUTH EVERY DAY AT NIGHT   ascorbic acid 250 MG tablet Commonly known as: VITAMIN C Take 1 tablet (250 mg total) by mouth 2 (two) times daily.   aspirin EC 81 MG tablet Take 1 tablet (81 mg total) by mouth daily.   atorvastatin 80 MG tablet Commonly known as: LIPITOR TAKE 1 TABVLET BY MOUTH EVERY DAY   carvedilol 12.5 MG  tablet Commonly known as: COREG Take 12.5 mg by mouth 2 (two) times daily with a meal.   diclofenac Sodium 1 % Gel Commonly known as: VOLTAREN diclofenac 1 % topical gel  APPLY 2 GRAMS TO AREA 4 TIMES A DAY AS NEEDED FOR PAIN   doxycycline 100 MG tablet Commonly known as: VIBRA-TABS 100 mg 2 (two) times daily.   ferrous sulfate 325 (65 FE) MG tablet Take 1 tablet (325 mg total) by mouth 2 (two) times daily with a meal.   GHT Blood Glucose Monitor w/Device Kit   hydrochlorothiazide 25 MG tablet Commonly known as: HYDRODIURIL Take 25 mg by mouth daily.   lactulose 10 GM/15ML solution Commonly known as: CHRONULAC lactulose 10 gram/15 mL oral solution   levETIRAcetam 500 MG tablet Commonly known as: KEPPRA Take 500 mg by mouth daily.   losartan 100 MG tablet Commonly known as: COZAAR Take 100 mg by mouth daily.   Myrbetriq 50 MG Tb24 tablet Generic drug: mirabegron ER TAKE 1 TABLET BY MOUTH EVERY DAY   PriLOSEC 40 MG capsule Generic drug: omeprazole Take 40 mg by mouth daily.   spironolactone 25 MG tablet Commonly known as: ALDACTONE Take 25 mg by mouth daily.       Allergies:  Allergies  Allergen Reactions  . Contrast Media [Iodinated Diagnostic Agents] Other (See Comments)    Made hands and feet "draw up"  . Tramadol Other (See Comments)    Other Reaction: OTHER REACTION, SOB, AGITATIO  . Adhesive [Tape] Rash    Some bandaids  . Penicillins Rash and Other (See Comments)    Has patient had a PCN reaction causing immediate rash, facial/tongue/throat swelling, SOB or lightheadedness with hypotension: Yes Has patient had a PCN reaction causing severe rash involving mucus membranes or skin necrosis: No Has patient had a PCN reaction that required hospitalization No Has patient had a PCN reaction occurring within the last 10 years: No If all of the above answers are "NO", then may proceed with Cephalosporin use.     Family History: Family History  Problem  Relation Age of Onset  . Aneurysm Father   . Hypertension Father   . Breast cancer Neg Hx     Social History:  reports that she has never smoked. She has never used smokeless tobacco. She reports that she does not drink alcohol or use drugs.  ROS: UROLOGY Frequent Urination?: No Hard to postpone urination?: No Burning/pain with urination?: No Get up at night to urinate?: No Leakage of urine?: No Urine stream starts and stops?: No Trouble starting stream?: No Do you have to strain to urinate?: No Blood in  urine?: No Urinary tract infection?: No Sexually transmitted disease?: No Injury to kidneys or bladder?: No Painful intercourse?: No Weak stream?: No Currently pregnant?: No Vaginal bleeding?: No Last menstrual period?: n  Gastrointestinal Nausea?: No Vomiting?: No Indigestion/heartburn?: No Diarrhea?: No Constipation?: No  Constitutional Fever: No Night sweats?: No Weight loss?: No Fatigue?: Yes  Skin Skin rash/lesions?: No Itching?: No  Eyes Blurred vision?: No Double vision?: No  Ears/Nose/Throat Sore throat?: No Sinus problems?: No  Hematologic/Lymphatic Swollen glands?: No Easy bruising?: Yes  Cardiovascular Leg swelling?: No Chest pain?: No  Respiratory Cough?: No Shortness of breath?: No  Endocrine Excessive thirst?: No  Musculoskeletal Back pain?: No Joint pain?: No  Neurological Headaches?: No Dizziness?: No  Psychologic Depression?: No Anxiety?: No  Physical Exam: BP 124/70   Pulse 76   Ht 4' 11"  (1.499 m)   Wt 150 lb (68 kg)   BMI 30.30 kg/m   Constitutional:  Well nourished. Alert and oriented, No acute distress. HEENT: [place] AT, mask in place.  Trachea midline, no masses. Cardiovascular: No clubbing, cyanosis, or edema. Respiratory: Normal respiratory effort, no increased work of breathing. Neurologic: Grossly intact, no focal deficits, moving all 4 extremities. Psychiatric: Normal mood and affect.   Laboratory  Data: Lab Results  Component Value Date   WBC 8.6 01/24/2019   HGB 11.0 (L) 01/24/2019   HCT 33.9 (L) 01/24/2019   MCV 92.9 01/24/2019   PLT 211 01/24/2019    Lab Results  Component Value Date   CREATININE 1.04 (H) 01/24/2019    No results found for: PSA  No results found for: TESTOSTERONE  Lab Results  Component Value Date   HGBA1C 6.9 (H) 08/23/2016    No results found for: TSH     Component Value Date/Time   CHOL 139 08/23/2016 0305   HDL 45 08/23/2016 0305   CHOLHDL 3.1 08/23/2016 0305   VLDL 13 08/23/2016 0305   LDLCALC 81 08/23/2016 0305    Lab Results  Component Value Date   AST 31 01/24/2019   Lab Results  Component Value Date   ALT 38 01/24/2019   No components found for: ALKALINEPHOPHATASE No components found for: BILIRUBINTOTAL  No results found for: ESTRADIOL  Urinalysis    Component Value Date/Time   COLORURINE STRAW (A) 01/24/2019 1312   APPEARANCEUR CLEAR (A) 01/24/2019 1312   LABSPEC 1.008 01/24/2019 1312   [place] 5.0 01/24/2019 1312   GLUCOSEU NEGATIVE 01/24/2019 1312   HGBUR NEGATIVE 01/24/2019 1312   [place] NEGATIVE 01/24/2019 1312   [place] NEGATIVE 01/24/2019 1312   PROTEINUR NEGATIVE 01/24/2019 1312   NITRITE NEGATIVE 01/24/2019 1312   LEUKOCYTESUR NEGATIVE 01/24/2019 1312  I have reviewed the labs.  Pertinent Imaging Results for [name] [name], [name] [name] (MRN [id number]) as of 09/13/2019 10:21  Ref. Range 09/13/2019 10:16  Scan Result Unknown 47     Assessment & Plan:    1. Mixed incontinence She has been instructed to have an intake of 60 ounces of water daily to protect her kidney function and the Myrbetriq 50 mg was not effective in combating her urinary incontinence She states she is quite satisfied with how things are at this time and wishes no further intervention at this time   2. Vaginal atrophy Could not tolerate cream  3.  Interstitial cystitis Asymptomatic at this time   Return if symptoms worsen or fail  to improve.  These notes generated with voice recognition software. I apologize for typographical errors.  Zara Council, [facility]  [place], [place] [address] [phone]

## 2019-10-04 ENCOUNTER — Telehealth: Payer: Self-pay | Admitting: Urology

## 2019-10-04 NOTE — Telephone Encounter (Signed)
Patient called the office and left a message on the clinical triage voicemail.  She states that she "thinks she needs to go back on medicine to hold her urine".  She says that the copay is $450 and she cannot afford to pay that.  She wants to know if there is a coupon or alternate medicine that she can try.  Please call her to discuss.

## 2019-10-04 NOTE — Telephone Encounter (Signed)
The other medications for the bladder are the anticholinergics which have the side effects of dry eyes, dry mouth, constipation, mental confusion and/or urinary retention.  We could send in a prescription for one of these medications as they are available in generic forms, but I do want her to be aware of the side effects prior to calling in the medication.

## 2019-10-04 NOTE — Telephone Encounter (Signed)
LMOM for patient to return call.

## 2019-10-04 NOTE — Telephone Encounter (Signed)
Patient called the office and left a voice mail message that she missed our call.    Please try to reach her tomorrow to discuss.

## 2019-10-04 NOTE — Telephone Encounter (Signed)
Spoke to patient and the Myrbetriq is to expensive. She is wanting to know if there is another medication she can try? She also states you had mentioned she should try PTNS. She wants to know how much that will cost her. I can tell Janett Billow to get PA on PTNS.

## 2019-10-05 MED ORDER — OXYBUTYNIN CHLORIDE ER 10 MG PO TB24: 10.0000 mg | ORAL_TABLET | Freq: Every day | ORAL | 0 refills | Status: DC

## 2019-10-05 NOTE — Addendum Note (Signed)
Addended by: Kyra Manges on: 10/05/2019 05:13 PM   Modules accepted: Orders

## 2019-10-05 NOTE — Telephone Encounter (Signed)
Ptns Are approved No authorization needed.

## 2019-10-05 NOTE — Telephone Encounter (Signed)
Spoke to patient and she is wanting to try another medication before starting PTNS treatments. Please advise

## 2019-10-05 NOTE — Telephone Encounter (Signed)
Patient notified RX sent to pharmacy and follow up appointment scheduled.

## 2019-10-05 NOTE — Telephone Encounter (Signed)
If she wanting to try a medication, we can send in a script for oxybutynin XL 10 mg daily, # 90 and have her follow up in 6 weeks.

## 2019-11-16 ENCOUNTER — Other Ambulatory Visit: Payer: Self-pay

## 2019-11-16 ENCOUNTER — Ambulatory Visit: Payer: Medicare HMO | Admitting: Urology

## 2019-11-16 ENCOUNTER — Encounter: Payer: Self-pay | Admitting: Urology

## 2019-11-16 VITALS — BP 146/71 | HR 61 | Ht 59.0 in | Wt 147.0 lb

## 2019-11-16 DIAGNOSIS — N3946 Mixed incontinence: Secondary | ICD-10-CM | POA: Diagnosis not present

## 2019-11-16 LAB — BLADDER SCAN AMB NON-IMAGING: Scan Result: 96

## 2019-11-16 MED ORDER — MIRABEGRON ER 50 MG PO TB24: 50.0000 mg | ORAL_TABLET | Freq: Every day | ORAL | 0 refills | Status: DC

## 2019-11-16 NOTE — Progress Notes (Signed)
06/24/2018 9:30 AM   [name] [name] October 12, 1940 [id number]  Referring provider: Kirk Ruths, MD [facility] [place],  [place] [address]  Chief Complaint  Patient presents with  . Urinary Incontinence    HPI: [name] [name] is a 79 year old female with mixed incontinence, vaginal atrophy and interstitial cystitis who presents today for a 6 week follow up.    Mixed incontinence The patient is  experiencing urgency x 8 or more (stable), frequency x 8 or more (stable), not restricting fluids to avoid visits to the restroom, is engaging in toilet mapping, incontinence x 8 or more (stable) and nocturia x 0-3 (stable).   Her BP is 146/71.   Her PVR is 79 mL.  Most recent Hemoglobin A1c 7.0%.   She did not find the oxybutynin helped control her urinary symptoms.  Patient denies any modifying or aggravating factors.  Patient denies any gross hematuria, dysuria or suprapubic/flank pain.  Patient denies any fevers, chills, nausea or vomiting.   She stated that the medication that worked the best for her was the [employer], but it is cost prohibitive for her.     Vaginal atrophy She stated she couldn't use the vaginal cream as it made her "sore down there."    Interstitial cystitis See above   PMH: Past Medical History:  Diagnosis Date  . Anemia   . Arthritis   . Cancer ([place])    melanoma  . Chronic kidney disease    stage 3  . Complication of anesthesia    sometimes hard to wake up  . Diabetes ([place])    diet controlled  . 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 GERD (gastroesophageal reflux disease)   . Headache   . Hyperlipemia   . Hypertension   . Pain    chronic  . Rosacea   . 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 Sleep apnea    cpap    Surgical History: Past Surgical History:  Procedure Laterality Date  . ABDOMINAL HYSTERECTOMY    . BACK SURGERY     lumbar fusion, laminectomy  .  BLEPHAROPLASTY Bilateral 10/24/14   Dr. Loni Muse. Vickki Muff, MBSC  . BREAST SURGERY     reduction  . CHOLECYSTECTOMY    . COLONOSCOPY WITH PROPOFOL N/A 03/17/2016   Procedure: COLONOSCOPY WITH PROPOFOL;  Surgeon: Manya Silvas, MD;  [facility] ENDOSCOPY;  Service: Endoscopy;  Laterality: N/A;  . rcr    . REDUCTION MAMMAPLASTY Bilateral 1985  . SHOULDER ARTHROSCOPY WITH OPEN ROTATOR CUFF REPAIR Right 06/22/2015   Procedure: SHOULDER ARTHROSCOPY WITH SUBACROMIAL DECOMPRESSION, RELEASE OF LONG HEAD OF BICEPS TENDON, RESECTION OF DISTAL CLAVICLE;  Surgeon: Leanor Kail, MD;  Location: [facility];  Service: Orthopedics;  Laterality: Right;  DIABETIC - diet controlled CPAP   . SKIN CANCER EXCISION     leg, back    Home Medications:  Allergies as of 11/16/2019      Reactions   Contrast Media [iodinated Diagnostic Agents] Other (See Comments)   Made hands and feet "draw up"   Tramadol Other (See Comments)   Other Reaction: OTHER REACTION, SOB, AGITATIO   Adhesive [tape] Rash   Some bandaids   Penicillins Rash, Other (See Comments)   Has patient had a PCN reaction causing immediate rash, facial/tongue/throat swelling, SOB or lightheadedness with hypotension: Yes Has patient had a PCN reaction causing  severe rash involving mucus membranes or skin necrosis: No Has patient had a PCN reaction that required hospitalization No Has patient had a PCN reaction occurring within the last 10 years: No If all of the above answers are "NO", then may proceed with Cephalosporin use.      Medication List       Accurate as of November 16, 2019 11:59 PM. If you have any questions, ask your nurse or doctor.        STOP taking these medications   oxybutynin 10 MG 24 hr tablet Commonly known as: DITROPAN-XL Stopped by: Zara Council, PA-C     TAKE these medications   acetaminophen 500 MG tablet Commonly known as: TYLENOL Take 500 mg by mouth every 8 (eight) hours as needed for mild pain.     amitriptyline 50 MG tablet Commonly known as: ELAVIL TAKE 1 TABLET BY MOUTH EVERY DAY AT NIGHT   ascorbic acid 250 MG tablet Commonly known as: VITAMIN C Take 1 tablet (250 mg total) by mouth 2 (two) times daily.   aspirin EC 81 MG tablet Take 1 tablet (81 mg total) by mouth daily.   atorvastatin 80 MG tablet Commonly known as: LIPITOR TAKE 1 TABVLET BY MOUTH EVERY DAY   Bayer Microlet Lancets lancets USE TO TEST BLOOD SUGAR ONCE DAILY   carvedilol 12.5 MG tablet Commonly known as: COREG Take 12.5 mg by mouth 2 (two) times daily with a meal.   Contour Next Test test strip Generic drug: glucose blood by XX route 2 (two) times daily Use as instructed.   diclofenac Sodium 1 % Gel Commonly known as: VOLTAREN diclofenac 1 % topical gel  APPLY 2 GRAMS TO AREA 4 TIMES A DAY AS NEEDED FOR PAIN   doxycycline 100 MG tablet Commonly known as: VIBRA-TABS 100 mg 2 (two) times daily.   ferrous sulfate 325 (65 FE) MG tablet Take 1 tablet (325 mg total) by mouth 2 (two) times daily with a meal.   furosemide 20 MG tablet Commonly known as: LASIX Take 20 mg by mouth daily.   gabapentin 300 MG capsule Commonly known as: NEURONTIN TAKE 300 MG AT NIGHT FOR A WEEK THEN INCREASE TO 600 MG AT NIGHT AND CONTINUE THAT DOSE   GHT Blood Glucose Monitor w/Device Kit   hydrochlorothiazide 25 MG tablet Commonly known as: HYDRODIURIL Take 25 mg by mouth daily.   lactulose 10 GM/15ML solution Commonly known as: CHRONULAC lactulose 10 gram/15 mL oral solution   levETIRAcetam 500 MG tablet Commonly known as: KEPPRA Take 500 mg by mouth daily.   losartan 100 MG tablet Commonly known as: COZAAR Take 100 mg by mouth daily.   Myrbetriq 50 MG Tb24 tablet Generic drug: mirabegron ER TAKE 1 TABLET BY MOUTH EVERY DAY What changed: Another medication with the same name was added. Make sure you understand how and when to take each. Changed by: Zara Council, PA-C   mirabegron ER 50 MG  Tb24 tablet Commonly known as: MYRBETRIQ Take 1 tablet (50 mg total) by mouth daily. What changed: You were already taking a medication with the same name, and this prescription was added. Make sure you understand how and when to take each. Changed by: Zara Council, PA-C   PriLOSEC 40 MG capsule Generic drug: omeprazole Take 40 mg by mouth daily.   spironolactone 25 MG tablet Commonly known as: ALDACTONE Take 25 mg by mouth daily.   traZODone 50 MG tablet Commonly known as: DESYREL TAKE 1/2 1 TABLETS BY  MOUTH AT NIGHT       Allergies:  Allergies  Allergen Reactions  . Contrast Media [Iodinated Diagnostic Agents] Other (See Comments)    Made hands and feet "draw up"  . Tramadol Other (See Comments)    Other Reaction: OTHER REACTION, SOB, AGITATIO  . Adhesive [Tape] Rash    Some bandaids  . Penicillins Rash and Other (See Comments)    Has patient had a PCN reaction causing immediate rash, facial/tongue/throat swelling, SOB or lightheadedness with hypotension: Yes Has patient had a PCN reaction causing severe rash involving mucus membranes or skin necrosis: No Has patient had a PCN reaction that required hospitalization No Has patient had a PCN reaction occurring within the last 10 years: No If all of the above answers are "NO", then may proceed with Cephalosporin use.     Family History: Family History  Problem Relation Age of Onset  . Aneurysm Father   . Hypertension Father   . Breast cancer Neg Hx     Social History:  reports that she has never smoked. She has never used smokeless tobacco. She reports that she does not drink alcohol or use drugs.  ROS: For pertinent review of systems please refer to history of present illness  Physical Exam: BP (!) 146/71   Pulse 61   Ht 4' 11"  (1.499 m)   Wt 147 lb (66.7 kg)   BMI 29.69 kg/m   Constitutional:  Well nourished. Alert and oriented, No acute distress. HEENT: [place] AT, mask in place.  Trachea midline, no  masses. Cardiovascular: No clubbing, cyanosis, or edema. Respiratory: Normal respiratory effort, no increased work of breathing. Neurologic: Grossly intact, no focal deficits, moving all 4 extremities. Psychiatric: Normal mood and affect.   Laboratory Data: Lab Results  Component Value Date   WBC 8.6 01/24/2019   HGB 11.0 (L) 01/24/2019   HCT 33.9 (L) 01/24/2019   MCV 92.9 01/24/2019   PLT 211 01/24/2019    Lab Results  Component Value Date   CREATININE 1.04 (H) 01/24/2019    Lab Results  Component Value Date   HGBA1C 6.9 (H) 08/23/2016       Component Value Date/Time   CHOL 139 08/23/2016 0305   HDL 45 08/23/2016 0305   CHOLHDL 3.1 08/23/2016 0305   VLDL 13 08/23/2016 0305   LDLCALC 81 08/23/2016 0305    Lab Results  Component Value Date   AST 31 01/24/2019   Lab Results  Component Value Date   ALT 38 01/24/2019    Urinalysis    Component Value Date/Time   COLORURINE STRAW (A) 01/24/2019 1312   APPEARANCEUR CLEAR (A) 01/24/2019 1312   LABSPEC 1.008 01/24/2019 1312   [place] 5.0 01/24/2019 1312   GLUCOSEU NEGATIVE 01/24/2019 1312   HGBUR NEGATIVE 01/24/2019 1312   BILIRUBINUR NEGATIVE 01/24/2019 1312   [place] 01/24/2019 1312   PROTEINUR NEGATIVE 01/24/2019 1312   NITRITE NEGATIVE 01/24/2019 1312   LEUKOCYTESUR NEGATIVE 01/24/2019 1312  I have reviewed the labs.  Pertinent Imaging Results for [name] [name], [name] [name] (MRN [id number]) as of 11/16/2019 13:44  Ref. Range 11/16/2019 09:40  Scan Result Unknown 96    Assessment & Plan:    1. Mixed incontinence I have given her 2 months worth of the Myrbetriq 50 mg samples as there is a new medication Gemtesa coming to market.  I explained that [name] [name] is in the same family as the Myrbetriq and that we will have samples of that medication soon, so I would be  able to provide samples for her to try the medication.  If [name] [name] has the same or even better efficacy, we can prescribe that medication and  hopefully get better insurance coverage for her   2. Vaginal atrophy Could not tolerate cream  3.  Interstitial cystitis Asymptomatic at this time   Return if symptoms worsen or fail to improve.  These notes generated with voice recognition software. I apologize for typographical errors.  Zara Council, PA-C  [facility] [address] [place], [place] [address] [phone]

## 2019-12-01 ENCOUNTER — Telehealth: Payer: Self-pay | Admitting: Urology

## 2019-12-01 NOTE — Telephone Encounter (Signed)
Patient notified and will pick up samples.

## 2019-12-01 NOTE — Telephone Encounter (Signed)
Would you let [name] [name] know that we have Gemtesa samples available and she can pick them up at the front desk?

## 2019-12-27 ENCOUNTER — Other Ambulatory Visit: Payer: Self-pay | Admitting: Urology

## 2020-01-12 ENCOUNTER — Emergency Department
Admission: EM | Admit: 2020-01-12 | Discharge: 2020-01-12 | Disposition: A | Discharge status: Home / Self Care | Payer: Medicare HMO | Attending: Student in an Organized Health Care Education/Training Program | Admitting: Student in an Organized Health Care Education/Training Program

## 2020-01-12 ENCOUNTER — Emergency Department: Payer: Medicare HMO

## 2020-01-12 ENCOUNTER — Other Ambulatory Visit: Payer: Self-pay

## 2020-01-12 DIAGNOSIS — Z86007 Personal history of in-situ neoplasm of skin: Secondary | ICD-10-CM | POA: Diagnosis not present

## 2020-01-12 DIAGNOSIS — Z7982 Long term (current) use of aspirin: Secondary | ICD-10-CM | POA: Insufficient documentation

## 2020-01-12 DIAGNOSIS — R197 Diarrhea, unspecified: Secondary | ICD-10-CM | POA: Insufficient documentation

## 2020-01-12 DIAGNOSIS — Z79899 Other long term (current) drug therapy: Secondary | ICD-10-CM | POA: Diagnosis not present

## 2020-01-12 DIAGNOSIS — E1122 Type 2 diabetes mellitus with diabetic chronic kidney disease: Secondary | ICD-10-CM | POA: Insufficient documentation

## 2020-01-12 DIAGNOSIS — I129 Hypertensive chronic kidney disease with stage 1 through stage 4 chronic kidney disease, or unspecified chronic kidney disease: Secondary | ICD-10-CM | POA: Diagnosis not present

## 2020-01-12 DIAGNOSIS — N183 Chronic kidney disease, stage 3 unspecified: Secondary | ICD-10-CM | POA: Insufficient documentation

## 2020-01-12 DIAGNOSIS — R109 Unspecified abdominal pain: Secondary | ICD-10-CM | POA: Insufficient documentation

## 2020-01-12 LAB — COMPREHENSIVE METABOLIC PANEL
ALT: 22 U/L (ref 0–44)
AST: 23 U/L (ref 15–41)
Albumin: 3.8 g/dL (ref 3.5–5.0)
Alkaline Phosphatase: 83 U/L (ref 38–126)
Anion gap: 11 (ref 5–15)
BUN: 11 mg/dL (ref 8–23)
CO2: 25 mmol/L (ref 22–32)
Calcium: 7.9 mg/dL — ABNORMAL LOW (ref 8.9–10.3)
Chloride: 107 mmol/L (ref 98–111)
Creatinine, Ser: 0.72 mg/dL (ref 0.44–1.00)
GFR calc Af Amer: >60 mL/min (ref >60)
GFR calc non Af Amer: >60 mL/min (ref >60)
Glucose, Bld: 150 mg/dL — ABNORMAL HIGH (ref 70–99)
Potassium: 3.1 mmol/L — ABNORMAL LOW (ref 3.5–5.1)
Sodium: 143 mmol/L (ref 135–145)
Total Bilirubin: 1.1 mg/dL (ref 0.3–1.2)
Total Protein: 6.7 g/dL (ref 6.5–8.1)

## 2020-01-12 LAB — URINALYSIS, COMPLETE (UACMP) WITH MICROSCOPIC
Bacteria, UA: NONE SEEN
Bilirubin Urine: NEGATIVE
Glucose, UA: NEGATIVE mg/dL
Hgb urine dipstick: NEGATIVE
Ketones, ur: NEGATIVE mg/dL
Leukocytes,Ua: NEGATIVE
Nitrite: NEGATIVE
Protein, ur: NEGATIVE mg/dL
Specific Gravity, Urine: 1.005 (ref 1.005–1.030)
pH: 6 (ref 5.0–8.0)

## 2020-01-12 LAB — CBC
HCT: 37.6 % (ref 36.0–46.0)
Hemoglobin: 12.4 g/dL (ref 12.0–15.0)
MCH: 30.2 pg (ref 26.0–34.0)
MCHC: 33 g/dL (ref 30.0–36.0)
MCV: 91.5 fL (ref 80.0–100.0)
Platelets: 221 10*3/uL (ref 150–400)
RBC: 4.11 MIL/uL (ref 3.87–5.11)
RDW: 14 % (ref 11.5–15.5)
WBC: 6.1 10*3/uL (ref 4.0–10.5)
nRBC: 0 % (ref 0.0–0.2)

## 2020-01-12 LAB — LIPASE, BLOOD: Lipase: 30 U/L (ref 11–51)

## 2020-01-12 MED ORDER — SODIUM CHLORIDE 0.9% FLUSH: 3.0000 mL | Freq: Once | INTRAVENOUS | Status: DC

## 2020-01-12 MED ORDER — SODIUM CHLORIDE 0.9 % IV BOLUS
500.0000 mL | Freq: Once | INTRAVENOUS | Status: AC
  Administered 2020-01-12: 500 mL via INTRAVENOUS

## 2020-01-12 MED ORDER — POTASSIUM CHLORIDE CRYS ER 20 MEQ PO TBCR
40.0000 meq | EXTENDED_RELEASE_TABLET | Freq: Once | ORAL | Status: AC
  Administered 2020-01-12: 40 meq via ORAL
  Filled 2020-01-12: qty 2

## 2020-01-12 NOTE — ED Triage Notes (Signed)
Pt c/o uncontrolled watery diarrhea for the past 2 weeks. Denies any N/V or abd pain at present.

## 2020-01-12 NOTE — ED Notes (Signed)
Pt to CT

## 2020-01-12 NOTE — Discharge Instructions (Signed)
Please double your potassium supplement for the next two days.  Follow up in clinic for repeat blood work.

## 2020-01-12 NOTE — ED Provider Notes (Signed)
[facility] Emergency Department Provider Note    First MD Initiated Contact with Patient 01/12/20 1201     (approximate)  I have reviewed the triage vital signs and the nursing notes.   HISTORY  Chief Complaint Diarrhea    HPI [name] [name] is a 79 y.o. female to history as listed below presents to the ER for evaluation of 2 weeks of intermittent watery diarrhea. Denies any melena or hematochezia. Not having any measured fevers. Is not taking any medication for this. States he does feel weak and tired. Denies any chest pain or shortness of breath. States she has not had any recent medication changes.      Past Medical History:  Diagnosis Date  . Anemia   . Arthritis   . Cancer ([place])    melanoma  . Chronic kidney disease    stage 3  . Complication of anesthesia    sometimes hard to wake up  . Diabetes ([place])    diet controlled  . 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 GERD (gastroesophageal reflux disease)   . Headache   . Hyperlipemia   . Hypertension   . Pain    chronic  . Rosacea   . 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 Sleep apnea    cpap   Family History  Problem Relation Age of Onset  . Aneurysm Father   . Hypertension Father   . Breast cancer Neg Hx    Past Surgical History:  Procedure Laterality Date  . ABDOMINAL HYSTERECTOMY    . BACK SURGERY     lumbar fusion, laminectomy  . BLEPHAROPLASTY Bilateral 10/24/14   Dr. Loni Muse. Vickki Muff, MBSC  . BREAST SURGERY     reduction  . CHOLECYSTECTOMY    . COLONOSCOPY WITH PROPOFOL N/A 03/17/2016   Procedure: COLONOSCOPY WITH PROPOFOL;  Surgeon: Manya Silvas, MD;  Location: [facility] ENDOSCOPY;  Service: Endoscopy;  Laterality: N/A;  . rcr    . REDUCTION MAMMAPLASTY Bilateral 1985  . SHOULDER ARTHROSCOPY WITH OPEN ROTATOR CUFF REPAIR Right 06/22/2015   Procedure: SHOULDER ARTHROSCOPY WITH SUBACROMIAL DECOMPRESSION, RELEASE OF LONG HEAD OF BICEPS TENDON,  RESECTION OF DISTAL CLAVICLE;  Surgeon: Leanor Kail, MD;  Location: [facility];  Service: Orthopedics;  Laterality: Right;  DIABETIC - diet controlled CPAP   . SKIN CANCER EXCISION     leg, back   Patient Active Problem List   Diagnosis Date Noted  . Aftercare 07/20/2018  . Pain in right knee 06/22/2018  . Sleeping difficulty 06/22/2018  . History of seizure 07/15/2017  . Insomnia 06/16/2017  . Chest pain 08/23/2016  . Weakness of left upper extremity 08/23/2016  . 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 Piriformis syndrome of right side 08/24/2015  . Impingement syndrome of right shoulder 07/26/2015  . Chronic right shoulder pain 02/07/2015  . Difficulty in walking 09/22/2014  . Diarrhea 09/15/2014  . Lumbosacral spondylosis 12/26/2013  . DDD (degenerative disc disease), lumbar 12/26/2013  . 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 Arthritis, lumbar spine 12/26/2013  . Diabetes ([place]) 11/18/2013  . Dyslipidemia 11/18/2013  . Convulsions, epileptic ([place]) 11/18/2013  . BP (high blood pressure) 11/18/2013  . Obstructive apnea 11/18/2013  . Arthropathy of cervical facet joint 04/27/2012  . Pain in shoulder 03/25/2012  . Cervical spondylosis without myelopathy 12/31/2011  . Cervical pain 12/31/2011      Prior to Admission medications  Medication Sig Start Date End Date Taking? Authorizing Provider  acetaminophen (TYLENOL) 500 MG tablet Take 500 mg by mouth every 8 (eight) hours as needed for mild pain.     [provider]  amitriptyline (ELAVIL) 50 MG tablet TAKE 1 TABLET BY MOUTH EVERY DAY AT NIGHT 11/10/18   [provider]  aspirin EC 81 MG tablet Take 1 tablet (81 mg total) by mouth daily. 08/23/16   Vaughan Basta, MD  atorvastatin (LIPITOR) 80 MG tablet TAKE 1 TABVLET BY MOUTH EVERY DAY 01/14/18   [provider]  Bayer Microlet Lancets lancets USE TO TEST BLOOD SUGAR ONCE DAILY 10/25/19   [provider]    Blood Glucose Monitoring Suppl (GHT BLOOD GLUCOSE MONITOR) w/Device KIT  08/30/19   [provider]  carvedilol (COREG) 12.5 MG tablet Take 12.5 mg by mouth 2 (two) times daily with a meal.    [provider]  diclofenac Sodium (VOLTAREN) 1 % GEL diclofenac 1 % topical gel  APPLY 2 GRAMS TO AREA 4 TIMES A DAY AS NEEDED FOR PAIN    [provider]  doxycycline (VIBRA-TABS) 100 MG tablet 100 mg 2 (two) times daily.  02/05/18   [provider]  ferrous sulfate 325 (65 FE) MG tablet Take 1 tablet (325 mg total) by mouth 2 (two) times daily with a meal. 12/19/18   Salary, Montell D, MD  furosemide (LASIX) 20 MG tablet Take 20 mg by mouth daily. 10/18/19   [provider]  gabapentin (NEURONTIN) 300 MG capsule TAKE 300 MG AT NIGHT FOR A WEEK THEN INCREASE TO 600 MG AT NIGHT AND CONTINUE THAT DOSE 11/07/19   [provider]  glucose blood (CONTOUR NEXT TEST) test strip by XX route 2 (two) times daily Use as instructed. 10/27/19   [provider]  hydrochlorothiazide (HYDRODIURIL) 25 MG tablet Take 25 mg by mouth daily. 12/14/18   [provider]  lactulose (CHRONULAC) 10 GM/15ML solution lactulose 10 gram/15 mL oral solution 01/20/19   [provider]  levETIRAcetam (KEPPRA) 500 MG tablet Take 500 mg by mouth daily.  08/04/18   [provider]  losartan (COZAAR) 100 MG tablet Take 100 mg by mouth daily. 08/10/18   [provider]  mirabegron ER (MYRBETRIQ) 50 MG TB24 tablet Take 1 tablet (50 mg total) by mouth daily. 11/16/19   Zara Council A, PA-C  MYRBETRIQ 50 MG TB24 tablet TAKE 1 TABLET BY MOUTH EVERY DAY 04/04/19   McGowan, Larene Beach A, PA-C  omeprazole (PRILOSEC) 40 MG capsule Take 40 mg by mouth daily.  10/11/08   [provider]  spironolactone (ALDACTONE) 25 MG tablet Take 25 mg by mouth daily. 09/01/19   [provider]  traZODone (DESYREL) 50 MG tablet TAKE 1/2 1 TABLETS BY MOUTH AT NIGHT  10/24/19   [provider]  vitamin C (VITAMIN C) 250 MG tablet Take 1 tablet (250 mg total) by mouth 2 (two) times daily. 12/19/18   Salary, Avel Peace, MD    Allergies Contrast media [iodinated diagnostic agents], Tramadol, Adhesive [tape], and Penicillins    Social History Social History   Tobacco Use  . Smoking status: Never Smoker  . Smokeless tobacco: Never Used  Substance Use Topics  . Alcohol use: No  . Drug use: No    Review of Systems Patient denies headaches, rhinorrhea, blurry vision, numbness, shortness of breath, chest pain, edema, cough, abdominal pain, nausea, vomiting, diarrhea, dysuria, fevers, rashes or hallucinations unless otherwise stated above  in HPI. ____________________________________________   PHYSICAL EXAM:  VITAL SIGNS: Vitals:   01/12/20 1538  BP: (!) 149/69  Pulse: (!) 56  Resp: 14  Temp: 97.8 F (36.6 C)  SpO2: 100%    Constitutional: Alert and oriented.  Eyes: Conjunctivae are normal.  Head: Atraumatic. Nose: No congestion/rhinnorhea. Mouth/Throat: Mucous membranes are moist.   Neck: No stridor. Painless ROM.  Cardiovascular: Normal rate, regular rhythm. Grossly normal heart sounds.  Good peripheral circulation. Respiratory: Normal respiratory effort.  No retractions. Lungs CTAB. Gastrointestinal: Soft and nontender. No distention. No abdominal bruits. No CVA tenderness. Genitourinary:  Musculoskeletal: No lower extremity tenderness nor edema.  No joint effusions. Neurologic:  Normal speech and language. No gross focal neurologic deficits are appreciated. No facial droop Skin:  Skin is warm, dry and intact. No rash noted. Psychiatric: Mood and affect are normal. Speech and behavior are normal.  ____________________________________________   LABS (all labs ordered are listed, but only abnormal results are displayed)  Results for orders placed or performed during the hospital encounter of 01/12/20 (from the past 24  hour(s))  Lipase, blood     Status: None   Collection Time: 01/12/20 10:59 AM  Result Value Ref Range   Lipase 30 11 - 51 U/L  Comprehensive metabolic panel     Status: Abnormal   Collection Time: 01/12/20 10:59 AM  Result Value Ref Range   Sodium 143 135 - 145 mmol/L   Potassium 3.1 (L) 3.5 - 5.1 mmol/L   Chloride 107 98 - 111 mmol/L   CO2 25 22 - 32 mmol/L   Glucose, Bld 150 (H) 70 - 99 mg/dL   BUN 11 8 - 23 mg/dL   Creatinine, Ser 0.72 0.44 - 1.00 mg/dL   Calcium 7.9 (L) 8.9 - 10.3 mg/dL   Total Protein 6.7 6.5 - 8.1 g/dL   Albumin 3.8 3.5 - 5.0 g/dL   AST 23 15 - 41 U/L   ALT 22 0 - 44 U/L   Alkaline Phosphatase 83 38 - 126 U/L   Total Bilirubin 1.1 0.3 - 1.2 mg/dL   GFR calc non Af Amer >60 >60 mL/min   GFR calc Af Amer >60 >60 mL/min   Anion gap 11 5 - 15  CBC     Status: None   Collection Time: 01/12/20 10:59 AM  Result Value Ref Range   WBC 6.1 4.0 - 10.5 K/uL   RBC 4.11 3.87 - 5.11 MIL/uL   Hemoglobin 12.4 12.0 - 15.0 g/dL   HCT 37.6 36.0 - 46.0 %   MCV 91.5 80.0 - 100.0 fL   MCH 30.2 26.0 - 34.0 pg   MCHC 33.0 30.0 - 36.0 g/dL   RDW 14.0 11.5 - 15.5 %   Platelets 221 150 - 400 K/uL   nRBC 0.0 0.0 - 0.2 %  Urinalysis, Complete w Microscopic     Status: Abnormal   Collection Time: 01/12/20 10:59 AM  Result Value Ref Range   Color, Urine STRAW (A) YELLOW   APPearance CLEAR (A) CLEAR   Specific Gravity, Urine 1.005 1.005 - 1.030   pH 6.0 5.0 - 8.0   Glucose, UA NEGATIVE NEGATIVE mg/dL   Hgb urine dipstick NEGATIVE NEGATIVE   Bilirubin Urine NEGATIVE NEGATIVE   Ketones, ur NEGATIVE NEGATIVE mg/dL   Protein, ur NEGATIVE NEGATIVE mg/dL   Nitrite NEGATIVE NEGATIVE   Leukocytes,Ua NEGATIVE NEGATIVE   RBC / HPF 0-5 0 - 5 RBC/hpf   WBC, UA 0-5 0 - 5 WBC/hpf  Bacteria, UA NONE SEEN NONE SEEN   Squamous Epithelial / LPF 0-5 0 - 5   ____________________________________________ ____________________________________________  RADIOLOGY  I personally reviewed all  radiographic images ordered to evaluate for the above acute complaints and reviewed radiology reports and findings.  These findings were personally discussed with the patient.  Please see medical record for radiology report.  ____________________________________________   PROCEDURES  Procedure(s) performed:  Procedures    Critical Care performed: no ____________________________________________   INITIAL IMPRESSION / ASSESSMENT AND PLAN / ED COURSE  Pertinent labs & imaging results that were available during my care of the patient were reviewed by me and considered in my medical decision making (see chart for details).   DDX: dehydration, electrolyte abn, enteritis, colitis, diverticulitis, ibd  SHAMEIKA SPEELMAN is a 79 y.o. who presents to the ED with sx as described above.  Patient is AFVSS in ED. Exam as above. Given current presentation have considered the above differential.  Non toxic appearing.  Blood work is reassuring.  Will send for stool studies if able to provide stool but patient feels diarrhea is resolving.  Will give IVF.  Patient states she already gave stool studies yesterday and is worried she won't be able to provide another, which is suggestive that her symptoms are improving.   Clinical Course as of Jan 11 1622  Thu Jan 12, 2020  1519 Patient up ambulating without any distress.  Is not had any additional diarrheal episodes.  Given reassuring work-up that she is appropriate for close outpatient follow-up.  We discussed signs and symptoms for which she should return to the ER.     [PR]    Clinical Course User Index [PR] Merlyn Lot, MD    The patient was evaluated in Emergency Department today for the symptoms described in the history of present illness. He/she was evaluated in the context of the global COVID-19 pandemic, which necessitated consideration that the patient might be at risk for infection with the SARS-CoV-2 virus that causes COVID-19.  Institutional protocols and algorithms that pertain to the evaluation of patients at risk for COVID-19 are in a state of rapid change based on information released by regulatory bodies including the CDC and federal and state organizations. These policies and algorithms were followed during the patient's care in the ED.  As part of my medical decision making, I reviewed the following data within the [place] notes reviewed and incorporated, Labs reviewed, notes from prior ED visits and [place] Controlled Substance Database   ____________________________________________   FINAL CLINICAL IMPRESSION(S) / ED DIAGNOSES  Final diagnoses:  Diarrhea, unspecified type      NEW MEDICATIONS STARTED DURING THIS VISIT:  Discharge Medication List as of 01/12/2020  3:26 PM       Note:  This document was prepared using Dragon voice recognition software and may include unintentional dictation errors.    Merlyn Lot, MD 01/12/20 1623

## 2020-01-12 NOTE — ED Notes (Signed)
Ambulatory from waiting room, reports diarrhea for 2 weeks.  States last time this happened had low potassium.  Reports unable to control urges for bowel movement.  Denies pain.  Denies nausea/vomiting.

## 2020-01-13 ENCOUNTER — Telehealth: Payer: Self-pay | Admitting: Urology

## 2020-01-13 NOTE — Telephone Encounter (Signed)
According to the PDR, diarrhea can occur in 2% of people.  I would suggest she stop it and see if the diarrhea clears up.

## 2020-01-13 NOTE — Telephone Encounter (Signed)
Pt called to inform you that the new Rx for [name] [name] is working for her.   fyi

## 2020-01-13 NOTE — Telephone Encounter (Signed)
Pt now calling stating she hates to call about this but pt has been dealing with diarrhea for over a month, going to her PCP and ER for diarrhea, Pt wants to know if diarrhea would be a side effect of the Gemtesa?  Please advise.

## 2020-01-13 NOTE — Telephone Encounter (Signed)
Called pt, gave her advice per Larene Beach, pt states her GI provider told her to also take 2 immodium pills today to see if that helps, if it didn't help to call back tomorrow, pt then asked which should she do? Repeated the advice from [facility] and asked pt to call GI to inform them of her situation and concerns and then proceed as directed.

## 2020-01-28 ENCOUNTER — Other Ambulatory Visit: Payer: Self-pay | Admitting: Urology

## 2020-03-19 ENCOUNTER — Other Ambulatory Visit: Payer: Self-pay

## 2020-03-19 ENCOUNTER — Ambulatory Visit: Payer: Medicare HMO | Admitting: Dermatology

## 2020-03-19 DIAGNOSIS — D229 Melanocytic nevi, unspecified: Secondary | ICD-10-CM

## 2020-03-19 DIAGNOSIS — D1800 Hemangioma unspecified site: Secondary | ICD-10-CM | POA: Diagnosis not present

## 2020-03-19 DIAGNOSIS — L821 Other seborrheic keratosis: Secondary | ICD-10-CM

## 2020-03-19 DIAGNOSIS — Z1283 Encounter for screening for malignant neoplasm of skin: Secondary | ICD-10-CM | POA: Diagnosis not present

## 2020-03-19 DIAGNOSIS — Z85828 Personal history of other malignant neoplasm of skin: Secondary | ICD-10-CM

## 2020-03-19 DIAGNOSIS — L719 Rosacea, unspecified: Secondary | ICD-10-CM

## 2020-03-19 DIAGNOSIS — L820 Inflamed seborrheic keratosis: Secondary | ICD-10-CM | POA: Diagnosis not present

## 2020-03-19 DIAGNOSIS — L578 Other skin changes due to chronic exposure to nonionizing radiation: Secondary | ICD-10-CM

## 2020-03-19 DIAGNOSIS — L814 Other melanin hyperpigmentation: Secondary | ICD-10-CM

## 2020-03-19 DIAGNOSIS — D692 Other nonthrombocytopenic purpura: Secondary | ICD-10-CM

## 2020-03-19 MED ORDER — DOXYCYCLINE HYCLATE 100 MG PO TABS: 100.0000 mg | ORAL_TABLET | Freq: Two times a day (BID) | ORAL | 2 refills | Status: DC

## 2020-03-19 NOTE — Patient Instructions (Signed)

## 2020-03-19 NOTE — Progress Notes (Signed)
   New Patient Visit  Subjective  [name] [name] is a 79 y.o. female who presents for the following: Annual Exam (Pt presents for TBSE, Pt report hx of skin cancers several years ago  ). Check her back and right arm moles itching and irritating. Pt request a refill of Doxycyline for Rosacea.  The patient presents for Total-Body Skin Exam (TBSE) for skin cancer screening and mole check.  The following portions of the chart were reviewed this encounter and updated as appropriate:  Tobacco  Allergies  Meds  Problems  Med Hx  Surg Hx  Fam Hx     Review of Systems:  No other skin or systemic complaints except as noted in HPI or Assessment and Plan.  Objective  Well appearing patient in no apparent distress; mood and affect are within normal limits.  A full examination was performed including scalp, head, eyes, ears, nose, lips, neck, chest, axillae, abdomen, back, buttocks, bilateral upper extremities, bilateral lower extremities, hands, feet, fingers, toes, fingernails, and toenails. All findings within normal limits unless otherwise noted below.  Objective  Back x 17, R chest x 1 (18): Erythematous keratotic or waxy stuck-on papule or plaque.   Objective  Head - Anterior (Face): Mid face erythema with telangiectasias +/- scattered inflammatory papules.    Assessment & Plan  Inflamed seborrheic keratosis (18) Back x 17, R chest x 1  Destruction of lesion - Back x 17, R chest x 1 Complexity: simple   Destruction method: cryotherapy   Informed consent: discussed and consent obtained   Timeout:  patient name, date of birth, surgical site, and procedure verified Lesion destroyed using liquid nitrogen: Yes   Region frozen until ice ball extended beyond lesion: Yes   Outcome: patient tolerated procedure well with no complications   Post-procedure details: wound care instructions given    Rosacea Head - Anterior (Face)  Cont Doxycycline 100 mg take 1 tablet bid   Reordered  Medications doxycycline (VIBRA-TABS) 100 MG tablet  Skin cancer screening   Lentigines - Scattered tan macules - Discussed due to sun exposure - Benign, observe - Call for any changes  Seborrheic Keratoses - Stuck-on, waxy, tan-brown papules and plaques  - Discussed benign etiology and prognosis. - Observe - Call for any changes  Melanocytic Nevi - Tan-brown and/or pink-flesh-colored symmetric macules and papules - Benign appearing on exam today - Observation - Call clinic for new or changing moles - Recommend daily use of broad spectrum spf 30+ sunscreen to sun-exposed areas.   Hemangiomas - Red papules - Discussed benign nature - Observe - Call for any changes  Actinic Damage - diffuse scaly erythematous macules with underlying dyspigmentation - Recommend daily broad spectrum sunscreen SPF 30+ to sun-exposed areas, reapply every 2 hours as needed.  - Call for new or changing lesions.  Skin cancer screening performed today.  Purpura - Violaceous macules and patches - Benign - Related to age, sun damage and/or use of blood thinners - Observe - Can use OTC arnica containing moisturizer such as Dermend Bruise Formula if desired - Call for worsening or other concerns  Return in about 1 year (around 03/19/2021).  IMarye Round, CMA, am acting as scribe for Sarina Ser, MD .  Documentation: I have reviewed the above documentation for accuracy and completeness, and I agree with the above.  Sarina Ser, MD

## 2020-03-22 ENCOUNTER — Encounter: Payer: Self-pay | Admitting: Dermatology

## 2020-05-02 ENCOUNTER — Other Ambulatory Visit: Payer: Self-pay | Admitting: Internal Medicine

## 2020-05-02 DIAGNOSIS — Z1231 Encounter for screening mammogram for malignant neoplasm of breast: Secondary | ICD-10-CM

## 2020-07-24 ENCOUNTER — Ambulatory Visit
Admission: RE | Admit: 2020-07-24 | Discharge: 2020-07-24 | Disposition: A | Discharge status: Home / Self Care | Payer: Medicare HMO | Source: Ambulatory Visit | Attending: Internal Medicine | Admitting: Internal Medicine

## 2020-07-24 ENCOUNTER — Other Ambulatory Visit: Payer: Self-pay

## 2020-07-24 DIAGNOSIS — Z1231 Encounter for screening mammogram for malignant neoplasm of breast: Secondary | ICD-10-CM | POA: Diagnosis not present

## 2020-07-29 ENCOUNTER — Emergency Department: Payer: Medicare HMO

## 2020-07-29 ENCOUNTER — Encounter: Payer: Self-pay | Admitting: Emergency Medicine

## 2020-07-29 ENCOUNTER — Other Ambulatory Visit: Payer: Self-pay

## 2020-07-29 ENCOUNTER — Observation Stay
Admission: EM | Admit: 2020-07-29 | Discharge: 2020-07-31 | Disposition: A | Discharge status: Home / Self Care | Payer: Medicare HMO | Attending: Internal Medicine | Admitting: Internal Medicine

## 2020-07-29 DIAGNOSIS — Z79899 Other long term (current) drug therapy: Secondary | ICD-10-CM | POA: Diagnosis not present

## 2020-07-29 DIAGNOSIS — E876 Hypokalemia: Secondary | ICD-10-CM

## 2020-07-29 DIAGNOSIS — G459 Transient cerebral ischemic attack, unspecified: Secondary | ICD-10-CM

## 2020-07-29 DIAGNOSIS — I129 Hypertensive chronic kidney disease with stage 1 through stage 4 chronic kidney disease, or unspecified chronic kidney disease: Secondary | ICD-10-CM | POA: Diagnosis not present

## 2020-07-29 DIAGNOSIS — Z20822 Contact with and (suspected) exposure to covid-19: Secondary | ICD-10-CM | POA: Insufficient documentation

## 2020-07-29 DIAGNOSIS — R200 Anesthesia of skin: Secondary | ICD-10-CM | POA: Diagnosis present

## 2020-07-29 DIAGNOSIS — E1122 Type 2 diabetes mellitus with diabetic chronic kidney disease: Secondary | ICD-10-CM | POA: Insufficient documentation

## 2020-07-29 DIAGNOSIS — N1831 Chronic kidney disease, stage 3a: Secondary | ICD-10-CM | POA: Diagnosis not present

## 2020-07-29 DIAGNOSIS — R531 Weakness: Secondary | ICD-10-CM | POA: Diagnosis not present

## 2020-07-29 DIAGNOSIS — R29898 Other symptoms and signs involving the musculoskeletal system: Secondary | ICD-10-CM

## 2020-07-29 DIAGNOSIS — Z7982 Long term (current) use of aspirin: Secondary | ICD-10-CM | POA: Diagnosis not present

## 2020-07-29 LAB — CBC
HCT: 37.2 % (ref 36.0–46.0)
Hemoglobin: 12.2 g/dL (ref 12.0–15.0)
MCH: 30.4 pg (ref 26.0–34.0)
MCHC: 32.8 g/dL (ref 30.0–36.0)
MCV: 92.8 fL (ref 80.0–100.0)
Platelets: 184 10*3/uL (ref 150–400)
RBC: 4.01 MIL/uL (ref 3.87–5.11)
RDW: 14.6 % (ref 11.5–15.5)
WBC: 8.3 10*3/uL (ref 4.0–10.5)
nRBC: 0 % (ref 0.0–0.2)

## 2020-07-29 LAB — DIFFERENTIAL
Abs Immature Granulocytes: 0.03 10*3/uL (ref 0.00–0.07)
Basophils Absolute: 0 10*3/uL (ref 0.0–0.1)
Basophils Relative: 0 %
Eosinophils Absolute: 0.1 10*3/uL (ref 0.0–0.5)
Eosinophils Relative: 1 %
Immature Granulocytes: 0 %
Lymphocytes Relative: 25 %
Lymphs Abs: 2.1 10*3/uL (ref 0.7–4.0)
Monocytes Absolute: 0.7 10*3/uL (ref 0.1–1.0)
Monocytes Relative: 9 %
Neutro Abs: 5.3 10*3/uL (ref 1.7–7.7)
Neutrophils Relative %: 65 %

## 2020-07-29 LAB — CBG MONITORING, ED: Glucose-Capillary: 112 mg/dL — ABNORMAL HIGH (ref 70–99)

## 2020-07-29 MED ORDER — SODIUM CHLORIDE 0.9% FLUSH
3.0000 mL | Freq: Once | INTRAVENOUS | Status: AC
  Administered 2020-07-30: 3 mL via INTRAVENOUS

## 2020-07-29 NOTE — ED Triage Notes (Signed)
Patient with complaint of left arm numbness times 30- 40 minutes.

## 2020-07-30 ENCOUNTER — Observation Stay (HOSPITAL_BASED_OUTPATIENT_CLINIC_OR_DEPARTMENT_OTHER)
Admit: 2020-07-30 | Discharge: 2020-07-30 | Disposition: A | Discharge status: Home / Self Care | Payer: Medicare HMO | Attending: Family Medicine | Admitting: Family Medicine

## 2020-07-30 ENCOUNTER — Observation Stay: Admit: 2020-07-30 | Payer: Medicare HMO

## 2020-07-30 ENCOUNTER — Observation Stay: Payer: Medicare HMO

## 2020-07-30 DIAGNOSIS — E876 Hypokalemia: Secondary | ICD-10-CM | POA: Diagnosis not present

## 2020-07-30 DIAGNOSIS — R29898 Other symptoms and signs involving the musculoskeletal system: Secondary | ICD-10-CM | POA: Diagnosis not present

## 2020-07-30 DIAGNOSIS — R200 Anesthesia of skin: Secondary | ICD-10-CM | POA: Diagnosis not present

## 2020-07-30 DIAGNOSIS — G459 Transient cerebral ischemic attack, unspecified: Secondary | ICD-10-CM | POA: Diagnosis not present

## 2020-07-30 DIAGNOSIS — G40909 Epilepsy, unspecified, not intractable, without status epilepticus: Secondary | ICD-10-CM

## 2020-07-30 LAB — COMPREHENSIVE METABOLIC PANEL
ALT: 29 U/L (ref 0–44)
AST: 25 U/L (ref 15–41)
Albumin: 3.7 g/dL (ref 3.5–5.0)
Alkaline Phosphatase: 77 U/L (ref 38–126)
Anion gap: 11 (ref 5–15)
BUN: 15 mg/dL (ref 8–23)
CO2: 28 mmol/L (ref 22–32)
Calcium: 7.3 mg/dL — ABNORMAL LOW (ref 8.9–10.3)
Chloride: 103 mmol/L (ref 98–111)
Creatinine, Ser: 0.6 mg/dL (ref 0.44–1.00)
GFR, Estimated: >60 mL/min (ref >60)
Glucose, Bld: 119 mg/dL — ABNORMAL HIGH (ref 70–99)
Potassium: 2.9 mmol/L — ABNORMAL LOW (ref 3.5–5.1)
Sodium: 142 mmol/L (ref 135–145)
Total Bilirubin: 0.8 mg/dL (ref 0.3–1.2)
Total Protein: 6.5 g/dL (ref 6.5–8.1)

## 2020-07-30 LAB — CBG MONITORING, ED: Glucose-Capillary: 78 mg/dL (ref 70–99)

## 2020-07-30 LAB — LIPID PANEL
Cholesterol: 109 mg/dL (ref 0–200)
HDL: 39 mg/dL — ABNORMAL LOW (ref >40)
LDL Cholesterol: 51 mg/dL (ref 0–99)
Total CHOL/HDL Ratio: 2.8 RATIO
Triglycerides: 96 mg/dL (ref <150)
VLDL: 19 mg/dL (ref 0–40)

## 2020-07-30 LAB — ECHOCARDIOGRAM COMPLETE
AR max vel: 1.96 cm2
AV Area VTI: 2 cm2
AV Area mean vel: 1.57 cm2
AV Mean grad: 4 mmHg
AV Peak grad: 7.5 mmHg
Ao pk vel: 1.37 m/s
Area-P 1/2: 2.77 cm2
Height: 59 in
S' Lateral: 2.63 cm
Weight: 2128 oz

## 2020-07-30 LAB — GLUCOSE, CAPILLARY
Glucose-Capillary: 154 mg/dL — ABNORMAL HIGH (ref 70–99)
Glucose-Capillary: 110 mg/dL — ABNORMAL HIGH (ref 70–99)

## 2020-07-30 LAB — RESP PANEL BY RT-PCR (FLU A&B, COVID) ARPGX2
Influenza A by PCR: NEGATIVE
Influenza B by PCR: NEGATIVE
SARS Coronavirus 2 by RT PCR: NEGATIVE

## 2020-07-30 LAB — MAGNESIUM
Magnesium: 1 mg/dL — ABNORMAL LOW (ref 1.7–2.4)
Magnesium: 1 mg/dL — ABNORMAL LOW (ref 1.7–2.4)

## 2020-07-30 LAB — PROTIME-INR
INR: 1 (ref 0.8–1.2)
Prothrombin Time: 12.4 seconds (ref 11.4–15.2)

## 2020-07-30 LAB — POTASSIUM: Potassium: 3.2 mmol/L — ABNORMAL LOW (ref 3.5–5.1)

## 2020-07-30 LAB — HEMOGLOBIN A1C
Hgb A1c MFr Bld: 6.7 % — ABNORMAL HIGH (ref 4.8–5.6)
Mean Plasma Glucose: 145.59 mg/dL

## 2020-07-30 LAB — APTT: aPTT: 29 seconds (ref 24–36)

## 2020-07-30 MED ORDER — ASPIRIN EC 81 MG PO TBEC
81.0000 mg | DELAYED_RELEASE_TABLET | Freq: Every day | ORAL | Status: DC
  Administered 2020-07-30 – 2020-07-31 (×2): 81 mg via ORAL
  Filled 2020-07-30 (×2): qty 1

## 2020-07-30 MED ORDER — SENNOSIDES-DOCUSATE SODIUM 8.6-50 MG PO TABS: 1.0000 | ORAL_TABLET | Freq: Every evening | ORAL | Status: DC | PRN

## 2020-07-30 MED ORDER — ACETAMINOPHEN 650 MG RE SUPP: 650.0000 mg | RECTAL | Status: DC | PRN

## 2020-07-30 MED ORDER — TRAZODONE HCL 50 MG PO TABS
25.0000 mg | ORAL_TABLET | Freq: Every day | ORAL | Status: DC
  Administered 2020-07-30: 20:00:00 25 mg via ORAL
  Filled 2020-07-30: qty 1

## 2020-07-30 MED ORDER — LORAZEPAM 2 MG/ML IJ SOLN: 1.0000 mg | INTRAMUSCULAR | Status: DC | PRN

## 2020-07-30 MED ORDER — LORAZEPAM 2 MG/ML IJ SOLN
1.0000 mg | Freq: Once | INTRAMUSCULAR | Status: AC
  Administered 2020-07-30: 1 mg via INTRAVENOUS
  Filled 2020-07-30: qty 1

## 2020-07-30 MED ORDER — POTASSIUM CHLORIDE CRYS ER 20 MEQ PO TBCR
40.0000 meq | EXTENDED_RELEASE_TABLET | Freq: Once | ORAL | Status: AC
  Administered 2020-07-30: 40 meq via ORAL
  Filled 2020-07-30: qty 2

## 2020-07-30 MED ORDER — SPIRONOLACTONE 25 MG PO TABS
25.0000 mg | ORAL_TABLET | Freq: Every day | ORAL | Status: DC
  Administered 2020-07-30: 09:00:00 25 mg via ORAL
  Filled 2020-07-30: qty 1

## 2020-07-30 MED ORDER — ACETAMINOPHEN 160 MG/5ML PO SOLN
650.0000 mg | ORAL | Status: DC | PRN
  Filled 2020-07-30: qty 20.3

## 2020-07-30 MED ORDER — MAGNESIUM SULFATE 4 GM/100ML IV SOLN
4.0000 g | Freq: Once | INTRAVENOUS | Status: AC
  Administered 2020-07-30: 15:00:00 4 g via INTRAVENOUS
  Filled 2020-07-30: qty 100

## 2020-07-30 MED ORDER — INSULIN ASPART 100 UNIT/ML ~~LOC~~ SOLN
0.0000 [IU] | Freq: Three times a day (TID) | SUBCUTANEOUS | Status: DC
  Administered 2020-07-30: 17:00:00 2 [IU] via SUBCUTANEOUS
  Filled 2020-07-30: qty 1

## 2020-07-30 MED ORDER — ASPIRIN EC 81 MG PO TBEC: 81.0000 mg | DELAYED_RELEASE_TABLET | Freq: Every day | ORAL | Status: DC

## 2020-07-30 MED ORDER — GABAPENTIN 300 MG PO CAPS
300.0000 mg | ORAL_CAPSULE | Freq: Every day | ORAL | Status: DC
  Administered 2020-07-30: 20:00:00 300 mg via ORAL
  Filled 2020-07-30: qty 1

## 2020-07-30 MED ORDER — ENOXAPARIN SODIUM 40 MG/0.4ML ~~LOC~~ SOLN
40.0000 mg | SUBCUTANEOUS | Status: DC
  Administered 2020-07-30 – 2020-07-31 (×2): 40 mg via SUBCUTANEOUS
  Filled 2020-07-30 (×2): qty 0.4

## 2020-07-30 MED ORDER — CLOPIDOGREL BISULFATE 75 MG PO TABS
75.0000 mg | ORAL_TABLET | Freq: Every day | ORAL | Status: DC
  Administered 2020-07-30 – 2020-07-31 (×2): 75 mg via ORAL
  Filled 2020-07-30 (×2): qty 1

## 2020-07-30 MED ORDER — LEVETIRACETAM 500 MG PO TABS
500.0000 mg | ORAL_TABLET | Freq: Every day | ORAL | Status: DC
  Administered 2020-07-30 – 2020-07-31 (×2): 500 mg via ORAL
  Filled 2020-07-30 (×2): qty 1

## 2020-07-30 MED ORDER — ASCORBIC ACID 500 MG PO TABS
250.0000 mg | ORAL_TABLET | Freq: Two times a day (BID) | ORAL | Status: DC
  Administered 2020-07-30 – 2020-07-31 (×3): 250 mg via ORAL
  Filled 2020-07-30 (×3): qty 1

## 2020-07-30 MED ORDER — STROKE: EARLY STAGES OF RECOVERY BOOK: Freq: Once | Status: DC

## 2020-07-30 MED ORDER — ACETAMINOPHEN 325 MG PO TABS
650.0000 mg | ORAL_TABLET | ORAL | Status: DC | PRN
  Administered 2020-07-30: 650 mg via ORAL
  Filled 2020-07-30: qty 2

## 2020-07-30 MED ORDER — ATORVASTATIN CALCIUM 20 MG PO TABS
80.0000 mg | ORAL_TABLET | Freq: Every day | ORAL | Status: DC
  Administered 2020-07-30 – 2020-07-31 (×2): 80 mg via ORAL
  Filled 2020-07-30 (×2): qty 4

## 2020-07-30 MED ORDER — FERROUS SULFATE 325 (65 FE) MG PO TABS
325.0000 mg | ORAL_TABLET | Freq: Two times a day (BID) | ORAL | Status: DC
  Administered 2020-07-30: 09:00:00 325 mg via ORAL
  Filled 2020-07-30 (×4): qty 1

## 2020-07-30 MED ORDER — POTASSIUM CHLORIDE 10 MEQ/100ML IV SOLN
10.0000 meq | Freq: Once | INTRAVENOUS | Status: AC
  Administered 2020-07-30: 10 meq via INTRAVENOUS
  Filled 2020-07-30: qty 100

## 2020-07-30 MED ORDER — SODIUM CHLORIDE 0.9 % IV SOLN: INTRAVENOUS | Status: DC

## 2020-07-30 MED ORDER — POTASSIUM CHLORIDE 20 MEQ PO PACK
40.0000 meq | PACK | Freq: Once | ORAL | Status: AC
  Administered 2020-07-31: 04:00:00 40 meq via ORAL
  Filled 2020-07-30: qty 2

## 2020-07-30 MED ORDER — LOSARTAN POTASSIUM 50 MG PO TABS
100.0000 mg | ORAL_TABLET | Freq: Every day | ORAL | Status: DC
  Administered 2020-07-31: 08:00:00 100 mg via ORAL
  Filled 2020-07-30 (×2): qty 2

## 2020-07-30 MED ORDER — PANTOPRAZOLE SODIUM 40 MG PO TBEC
40.0000 mg | DELAYED_RELEASE_TABLET | Freq: Every day | ORAL | Status: DC
  Administered 2020-07-30 – 2020-07-31 (×2): 40 mg via ORAL
  Filled 2020-07-30 (×2): qty 1

## 2020-07-30 MED ORDER — CARVEDILOL 6.25 MG PO TABS
12.5000 mg | ORAL_TABLET | Freq: Two times a day (BID) | ORAL | Status: DC
  Administered 2020-07-30 – 2020-07-31 (×3): 12.5 mg via ORAL
  Filled 2020-07-30 (×3): qty 2

## 2020-07-30 NOTE — Progress Notes (Signed)
Pt unable to remember all medications and requested husband to bring in medication list from home. Husband in agreement and will bring list in tomorrow morning. Pt requested not to take PTA medications until list is updated due to old medications being on the list. Hospitalist Mansy was notified through secure chat message of the process and time of update for medications.

## 2020-07-30 NOTE — Progress Notes (Signed)
*  PRELIMINARY RESULTS* Echocardiogram 2D Echocardiogram has been performed.  Sherrie Sport 07/30/2020, 12:04 PM

## 2020-07-30 NOTE — ED Notes (Signed)
Pt assisted to BR.

## 2020-07-30 NOTE — Consult Note (Signed)
Requesting Physician: Mansy    Chief Complaint: Left arm weakness and numbness  I have been asked by Dr. Sidney Ace to see this patient in consultation for acute infarct.  HPI: [name] [name] is an 79 y.o. female with a known history of seizure disorder, hypertension, dyslipidemia and stage III chronic kidney disease, GERD and sleep apnea, who presented to the emergency room with acute onset of left arm weakness and numbness.  She denies any lower extremity paresthesias and denies any headache or dizziness or blurred vision.  No witnessed seizures.  No nausea or vomiting or abdominal pain.  No presyncope or syncope.  No tinnitus or vertigo.  She denies any chest pain or dyspnea or palpitations.  No fever or chills.  Upon presentation to the emergency room, blood pressure was 177/58 with a heart rate of 59 with otherwise normal vital signs.  BP later on was 150/61.  Labs revealed hypokalemia of 2.9 and a calcium of 7.3 with albumin 3.7 with unremarkable CBC. EKG showed mild sinus bradycardia with rate of 58 with nonspecific intraventricular conduction delay.  Uncontrasted head CT scan revealed no acute intracranial normalities.  It showed patchy nonspecific supratentorial cerebral white matter disease that is nonspecific but overall grossly similar to previous exams.  The patient was given 1 mg of IV Ativan, 40 mEq p.o. potassium chloride and 10 mcg IV potassium chloride.  She will be admitted to a an observation   Date last known well: Date: 07/29/2020 Time last known well: Time: 22:45 tPA Given: No: no disabling deficits present  Past Medical History:  Diagnosis Date  . Anemia   . Arthritis   . Cancer ([place])    melanoma  . Chronic kidney disease    stage 3  . Complication of anesthesia    sometimes hard to wake up  . Diabetes ([place])    diet controlled  . 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 GERD (gastroesophageal reflux disease)   . Headache   . Hyperlipemia   .  Hypertension   . Pain    chronic  . Rosacea   . Seizures ([place])    partial epilepsy - none 2 yrs  . Sleep apnea    cpap    Past Surgical History:  Procedure Laterality Date  . ABDOMINAL HYSTERECTOMY    . BACK SURGERY     lumbar fusion, laminectomy  . BLEPHAROPLASTY Bilateral 10/24/14   Dr. Loni Muse. Vickki Muff, MBSC  . BREAST SURGERY     reduction  . CHOLECYSTECTOMY    . COLONOSCOPY WITH PROPOFOL N/A 03/17/2016   Procedure: COLONOSCOPY WITH PROPOFOL;  Surgeon: Manya Silvas, MD;  Location: [facility] ENDOSCOPY;  Service: Endoscopy;  Laterality: N/A;  . rcr    . REDUCTION MAMMAPLASTY Bilateral 1985  . SHOULDER ARTHROSCOPY WITH OPEN ROTATOR CUFF REPAIR Right 06/22/2015   Procedure: SHOULDER ARTHROSCOPY WITH SUBACROMIAL DECOMPRESSION, RELEASE OF LONG HEAD OF BICEPS TENDON, RESECTION OF DISTAL CLAVICLE;  Surgeon: Leanor Kail, MD;  Location: [facility];  Service: Orthopedics;  Laterality: Right;  DIABETIC - diet controlled CPAP   . SKIN CANCER EXCISION     leg, back    Family History  Problem Relation Age of Onset  . Aneurysm Father   . Hypertension Father   . Breast cancer Neg Hx    Social History:  reports that she has never smoked. She has never used smokeless tobacco. She reports that she does not drink alcohol and does not  use drugs.  Allergies:  Allergies  Allergen Reactions  . Contrast Media [Iodinated Diagnostic Agents] Other (See Comments)    Made hands and feet "draw up"  . Tramadol Other (See Comments)    Other Reaction: OTHER REACTION, SOB, AGITATIO  . Adhesive [Tape] Rash    Some bandaids  . Penicillins Rash and Other (See Comments)    Has patient had a PCN reaction causing immediate rash, facial/tongue/throat swelling, SOB or lightheadedness with hypotension: Yes Has patient had a PCN reaction causing severe rash involving mucus membranes or skin necrosis: No Has patient had a PCN reaction that required hospitalization No Has patient had a PCN reaction  occurring within the last 10 years: No If all of the above answers are "NO", then may proceed with Cephalosporin use.     Medications:  Prior to Admission medications   Medication Sig Start Date End Date Taking? Authorizing Provider  acetaminophen (TYLENOL) 500 MG tablet Take 500 mg by mouth every 8 (eight) hours as needed for mild pain.     [provider]  amitriptyline (ELAVIL) 50 MG tablet TAKE 1 TABLET BY MOUTH EVERY DAY AT NIGHT 11/10/18   [provider]  aspirin EC 81 MG tablet Take 1 tablet (81 mg total) by mouth daily. 08/23/16   Vaughan Basta, MD  atorvastatin (LIPITOR) 80 MG tablet TAKE 1 TABVLET BY MOUTH EVERY DAY 01/14/18   [provider]  Bayer Microlet Lancets lancets USE TO TEST BLOOD SUGAR ONCE DAILY 10/25/19   [provider]  Blood Glucose Monitoring Suppl (GHT BLOOD GLUCOSE MONITOR) w/Device KIT  08/30/19   [provider]  carvedilol (COREG) 12.5 MG tablet Take 12.5 mg by mouth 2 (two) times daily with a meal.    [provider]  diclofenac Sodium (VOLTAREN) 1 % GEL diclofenac 1 % topical gel  APPLY 2 GRAMS TO AREA 4 TIMES A DAY AS NEEDED FOR PAIN    [provider]  doxycycline (VIBRA-TABS) 100 MG tablet Take 1 tablet (100 mg total) by mouth 2 (two) times daily. 03/19/20   Ralene Bathe, MD  ferrous sulfate 325 (65 FE) MG tablet Take 1 tablet (325 mg total) by mouth 2 (two) times daily with a meal. 12/19/18   Salary, Holly Bodily D, MD  furosemide (LASIX) 20 MG tablet Take 20 mg by mouth daily. 10/18/19   [provider]  gabapentin (NEURONTIN) 300 MG capsule TAKE 300 MG AT NIGHT FOR A WEEK THEN INCREASE TO 600 MG AT NIGHT AND CONTINUE THAT DOSE 11/07/19   [provider]  glucose blood (CONTOUR NEXT TEST) test strip by XX route 2 (two) times daily Use as instructed. 10/27/19   [provider]  hydrochlorothiazide (HYDRODIURIL) 25 MG tablet Take 25 mg by mouth daily. 12/14/18   [provider]  lactulose (CHRONULAC) 10 GM/15ML solution lactulose 10 gram/15 mL oral solution 01/20/19   [provider]  levETIRAcetam (KEPPRA) 500 MG tablet Take 500 mg by mouth daily.  08/04/18   [provider]  losartan (COZAAR) 100 MG tablet Take 100 mg by mouth daily. 08/10/18   [provider]  mirabegron ER (MYRBETRIQ) 50 MG TB24 tablet Take 1 tablet (50 mg total) by mouth daily. 11/16/19   Zara Council A, PA-C  MYRBETRIQ 50 MG TB24 tablet TAKE 1 TABLET BY MOUTH EVERY DAY 04/04/19   McGowan, Larene Beach A, PA-C  omeprazole (PRILOSEC) 40 MG capsule Take 40 mg by mouth daily.  10/11/08   [provider]  spironolactone (ALDACTONE) 25 MG tablet Take 25 mg by mouth daily. 09/01/19   [provider]  traZODone (DESYREL) 50 MG tablet TAKE 1/2 1 TABLETS BY MOUTH AT NIGHT 10/24/19   [provider]  vitamin C (VITAMIN C) 250 MG tablet Take 1 tablet (250 mg total) by mouth 2 (two) times daily. 12/19/18   Salary, Avel Peace, MD     ROS: History obtained from the patient  General ROS: negative for - chills, fatigue, fever, night sweats, weight gain or weight loss Psychological ROS: negative for - behavioral disorder, hallucinations, memory difficulties, mood swings or suicidal ideation Ophthalmic ROS: negative for - blurry vision, double vision, eye pain or loss of vision ENT ROS: negative for - epistaxis, nasal discharge, oral lesions, sore throat, tinnitus or vertigo Allergy and Immunology ROS: negative for - hives or itchy/watery eyes Hematological and Lymphatic ROS: negative for - bleeding problems, bruising or swollen lymph nodes Endocrine ROS: negative for - galactorrhea, hair pattern changes, polydipsia/polyuria or temperature intolerance Respiratory ROS: negative for - cough, hemoptysis, shortness of breath or wheezing Cardiovascular ROS: negative for - chest pain, dyspnea on exertion, edema or irregular heartbeat Gastrointestinal ROS:  negative for - abdominal pain, diarrhea, hematemesis, nausea/vomiting or stool incontinence Genito-Urinary ROS: negative for - dysuria, hematuria, incontinence or urinary frequency/urgency Musculoskeletal ROS: negative for - joint swelling or muscular weakness Neurological ROS: as noted in HPI Dermatological ROS: negative for rash and skin lesion changes  Physical Examination: Blood pressure 134/71, pulse (!) 52, temperature 98 F (36.7 C), temperature source Oral, resp. rate 18, height 4' 11"  (1.499 m), weight 60.3 kg, SpO2 96 %.  HEENT-  Normocephalic, no lesions, without obvious abnormality.  Normal external eye and conjunctiva.  Normal auditory canals and external ears. Normal external nose, mucus membranes and septum.   Cardiovascular- S1, S2 normal, pulses palpable throughout   Lungs- chest clear, no wheezing, rales, normal symmetric air entry Abdomen- soft, non-tender; bowel sounds normal; no masses,  no organomegaly Extremities- no edema Lymph-no adenopathy palpable Musculoskeletal-arthritic changes Skin-warm and dry, no hyperpigmentation, vitiligo, or suspicious lesions  Neurological Examination   Mental Status: Alert, oriented, thought content appropriate.  Speech fluent without evidence of aphasia.  Able to follow 3 step commands without difficulty. Cranial Nerves: II: Visual fields grossly normal, pupils equal, round, reactive to light and accommodation III,IV, VI: ptosis not present, extra-ocular motions intact bilaterally V,VII: smile symmetric, facial light touch sensation normal bilaterally VIII: hearing normal bilaterally IX,X: gag reflex present XI: bilateral shoulder shrug XII: midline tongue extension Motor: Right : Upper extremity   5/5    Left:     Upper extremity   5/5 with mild hand intrinsic weakness at 5-/5  Lower extremity   5/5     Lower extremity   5/5 Tone and bulk:normal tone throughout; no atrophy noted Sensory: Pinprick and light touch intact  throughout, bilaterally Deep Tendon Reflexes: Symmetric throughout Plantars: Right: mute   Left: mute Cerebellar: Normal finger-to-nose and normal heel-to-shin testing bilaterally Gait: not tested due to safety concerns  Laboratory Studies:  Basic Metabolic Panel: Recent Labs  Lab 07/29/20 2339 07/30/20 0917  NA 142  --   K 2.9* 3.2*  CL 103  --   CO2 28  --   GLUCOSE 119*  --   BUN 15  --   CREATININE 0.60  --   CALCIUM 7.3*  --   MG 1.0* 1.0*    Liver Function Tests: Recent Labs  Lab 07/29/20  10-17-2337  AST 25  ALT 29  ALKPHOS 77  BILITOT 0.8  PROT 6.5  ALBUMIN 3.7   No results for input(s): LIPASE, AMYLASE in the last 168 hours. No results for input(s): AMMONIA in the last 168 hours.  CBC: Recent Labs  Lab 07/29/20 2339  WBC 8.3  NEUTROABS 5.3  HGB 12.2  HCT 37.2  MCV 92.8  PLT 184    Cardiac Enzymes: No results for input(s): CKTOTAL, CKMB, CKMBINDEX, TROPONINI in the last 168 hours.  BNP: Invalid input(s): POCBNP  CBG: Recent Labs  Lab 07/29/20 10-18-2331 07/30/20 0916  GLUCAP 112* 78    Microbiology: Results for orders placed or performed during the hospital encounter of 07/29/20  Resp Panel by RT-PCR (Flu A&B, Covid) Nasopharyngeal Swab     Status: None   Collection Time: 07/30/20 12:09 AM   Specimen: Nasopharyngeal Swab; Nasopharyngeal(NP) swabs in vial transport medium  Result Value Ref Range Status   SARS Coronavirus 2 by RT PCR NEGATIVE NEGATIVE Final    Comment: (NOTE) SARS-CoV-2 target nucleic acids are NOT DETECTED.  The SARS-CoV-2 RNA is generally detectable in upper respiratory specimens during the acute phase of infection. The lowest concentration of SARS-CoV-2 viral copies this assay can detect is 138 copies/mL. A negative result does not preclude SARS-Cov-2 infection and should not be used as the sole basis for treatment or other patient management decisions. A negative result may occur with  improper specimen  collection/handling, submission of specimen other than nasopharyngeal swab, presence of viral mutation(s) within the areas targeted by this assay, and inadequate number of viral copies(<138 copies/mL). A negative result must be combined with clinical observations, patient history, and epidemiological information. The expected result is Negative.  Fact Sheet for Patients:  EntrepreneurPulse.com.au  Fact Sheet for Healthcare Providers:  IncredibleEmployment.be  This test is no t yet approved or cleared by the [place] FDA and  has been authorized for detection and/or diagnosis of SARS-CoV-2 by FDA under an Emergency Use Authorization (EUA). This EUA will remain  in effect (meaning this test can be used) for the duration of the COVID-19 declaration under Section 564(b)(1) of the Act, 21 U.S.C.section 360bbb-3(b)(1), unless the authorization is terminated  or revoked sooner.       Influenza A by PCR NEGATIVE NEGATIVE Final   Influenza B by PCR NEGATIVE NEGATIVE Final    Comment: (NOTE) The Xpert Xpress SARS-CoV-2/FLU/RSV plus assay is intended as an aid in the diagnosis of influenza from Nasopharyngeal swab specimens and should not be used as a sole basis for treatment. Nasal washings and aspirates are unacceptable for Xpert Xpress SARS-CoV-2/FLU/RSV testing.  Fact Sheet for Patients: EntrepreneurPulse.com.au  Fact Sheet for Healthcare Providers: IncredibleEmployment.be  This test is not yet approved or cleared by the [place] FDA and has been authorized for detection and/or diagnosis of SARS-CoV-2 by FDA under an Emergency Use Authorization (EUA). This EUA will remain in effect (meaning this test can be used) for the duration of the COVID-19 declaration under Section 564(b)(1) of the Act, 21 U.S.C. section 360bbb-3(b)(1), unless the authorization is terminated or revoked.  Performed at [facility], [address] [place], [place] [address]     Coagulation Studies: Recent Labs    07/29/20 17-Oct-2337  LABPROT 12.4  INR 1.0    Urinalysis: No results for input(s): COLORURINE, LABSPEC, PHURINE, GLUCOSEU, HGBUR, BILIRUBINUR, KETONESUR, PROTEINUR, UROBILINOGEN, NITRITE, LEUKOCYTESUR in the last 168 hours.  Invalid input(s): APPERANCEUR  Lipid Panel:    Component Value  Date/Time   CHOL 109 07/30/2020 0637   TRIG 96 07/30/2020 0637   HDL 39 (L) 07/30/2020 0637   CHOLHDL 2.8 07/30/2020 0637   VLDL 19 07/30/2020 0637   LDLCALC 51 07/30/2020 0637    HgbA1C:  Lab Results  Component Value Date   HGBA1C 6.9 (H) 08/23/2016    Urine Drug Screen:  No results found for: LABOPIA, COCAINSCRNUR, LABBENZ, AMPHETMU, THCU, LABBARB  Alcohol Level: No results for input(s): ETH in the last 168 hours.  Other results: EKG: sinus rhythm at 58 bpm.  Imaging: MR BRAIN WO CONTRAST  Result Date: 07/30/2020 CLINICAL DATA:  Initial evaluation for acute neuro deficit, stroke suspected. EXAM: MRI HEAD WITHOUT CONTRAST TECHNIQUE: Multiplanar, multiecho pulse sequences of the brain and surrounding structures were obtained without intravenous contrast. COMPARISON:  Prior CT from 07/29/2020 as well as previous MRI from 08/23/2016. FINDINGS: Brain: Cerebral volume stable, and remains within normal limits for age. Scattered patchy predominantly subcentimeter T2/FLAIR hyperintensity again seen involving the periventricular, deep, and subcortical white matter both cerebral hemispheres, nonspecific, but overall mildly progressed as compared to 2018. Appearance is mild to moderate for age. No abnormal foci of restricted diffusion to suggest acute or subacute ischemia. Gray-white matter differentiation maintained. No encephalomalacia to suggest chronic cortical infarction. No foci of susceptibility artifact to suggest acute or chronic intracranial hemorrhage. No mass lesion, midline shift or mass effect.  No hydrocephalus or extra-axial fluid collection. No made of a partially empty sella. Midline structures intact. Vascular: Major intracranial vascular flow voids are maintained. Hypoplastic right vertebral artery noted. Skull and upper cervical spine: Degenerative thickening at the tectorial membrane without significant stenosis. Craniocervical junction otherwise unremarkable. Bone marrow signal intensity within normal limits. No scalp soft tissue abnormality. Sinuses/Orbits: Globes and orbital soft tissues within normal limits. Mild scattered mucosal thickening noted within the ethmoidal air cells. Paranasal sinuses are otherwise clear. No mastoid effusion. Inner ear structures grossly normal. Other: None. IMPRESSION: 1. No acute intracranial abnormality. 2. Scattered T2/FLAIR hyperintensity involving the periventricular, deep, and subcortical white matter of both cerebral hemispheres, overall mildly progressed as compared to most recent available MRI from 2018. Findings are nonspecific, but most commonly related to chronic microvascular ischemic disease. Differential considerations include sequelae of complicated migraines, vasculitis, prior infectious or inflammatory process, or possibly demyelination. Electronically Signed   By: Jeannine Boga M.D.   On: 07/30/2020 02:21   US Carotid Bilateral (at [facility] and [facility] only)  Result Date: 07/30/2020 CLINICAL DATA:  TIA. History of hypertension, hyperlipidemia and diabetes. EXAM: BILATERAL CAROTID DUPLEX ULTRASOUND TECHNIQUE: Pearline Cables scale imaging, color Doppler and duplex ultrasound were performed of bilateral carotid and vertebral arteries in the neck. COMPARISON:  08/23/2016 FINDINGS: Criteria: Quantification of carotid stenosis is based on velocity parameters that correlate the residual internal carotid diameter with NASCET-based stenosis levels, using the diameter of the distal internal carotid lumen as the denominator for stenosis measurement. The following  velocity measurements were obtained: RIGHT ICA: 107/36 cm/sec CCA: 83/66 cm/sec SYSTOLIC ICA/CCA RATIO:  1.6 ECA: 675 cm/sec LEFT ICA: 161/34 cm/sec CCA: 29/47 cm/sec SYSTOLIC ICA/CCA RATIO:  2.3 ECA: 62 cm/sec RIGHT CAROTID ARTERY: There is a minimal amount of eccentric echogenic plaque within the right carotid bulb (image 7 and 18), extending to involve the origin and proximal aspects of the right internal carotid artery (image 23), grossly unchanged compared to the 08/2016 examination and again not resulting in elevated peak systolic velocities within the interrogated course the right internal carotid artery to suggest a  hemodynamically significant stenosis. RIGHT VERTEBRAL ARTERY:  Antegrade flow LEFT CAROTID ARTERY: There is a minimal moderate amount of eccentric echogenic plaque within the left carotid bulb (image 36 and 49), extending to involve the origin and proximal aspects of the left internal carotid artery (image 55), grossly unchanged compared to the 2018 examination and again resulting in elevated peak systolic velocities within the distal aspect of the left internal carotid artery. Greatest acquired peak systolic velocity within the distal left ICA measures 161 centimeters/second (image 63), previously, greatest acquired peak systolic velocity with the proximal left ICA measured 135 centimeters/second. LEFT VERTEBRAL ARTERY:  Antegrade flow IMPRESSION: 1. Minimal moderate amount of left-sided atherosclerotic plaque, morphologically similar to the 2018 examination and again results in elevated peak systolic velocities within the left internal carotid artery compatible with the 50-69% luminal narrowing range. Further evaluation with CTA could be performed as clinically indicated. 2. Minimal amount of right-sided atherosclerotic plaque, not resulting in a hemodynamically significant stenosis. Electronically Signed   By: Sandi Mariscal M.D.   On: 07/30/2020 07:49   CT HEAD CODE STROKE WO  CONTRAST  Result Date: 07/30/2020 CLINICAL DATA:  Code stroke. Initial evaluation for acute stroke, left-sided numbness. EXAM: CT HEAD WITHOUT CONTRAST TECHNIQUE: Contiguous axial images were obtained from the base of the skull through the vertex without intravenous contrast. COMPARISON:  Prior MRI from 08/23/2016. FINDINGS: Brain: Cerebral volume stable, and remains within normal limits for age. Patchy hypodensity involving the supratentorial cerebral white matter noted, nonspecific, but overall grossly similar and stable from previous. No acute intracranial hemorrhage. No acute large vessel territory infarct. No mass lesion, midline shift or mass effect. No hydrocephalus or extra-axial fluid collection. Vascular: No hyperdense vessel. Skull: Scalp soft tissues and calvarium within normal limits. Sinuses/Orbits: Globes and orbital soft tissues demonstrate no acute finding. Sequelae of prior left orbital floor repair noted. Scattered mucosal thickening noted within the ethmoidal air cells. Paranasal sinuses are otherwise clear. No mastoid effusion. Other: None. ASPECTS [facility] Stroke Program Early CT Score) - Ganglionic level infarction (caudate, lentiform nuclei, internal capsule, insula, M1-M3 cortex): 7 - Supraganglionic infarction (M4-M6 cortex): 3 Total score (0-10 with 10 being normal): 10 IMPRESSION: 1. No acute intracranial infarct or other abnormality. 2. ASPECTS is 10. 3. Patchy nonspecific supratentorial cerebral white matter disease, nonspecific, but overall grossly similar to previous exams. Critical Value/emergent results were called by telephone at the time of interpretation on 07/29/2020 at 11:58 pm to provider Rudene Re , who verbally acknowledged these results. Electronically Signed   By: Jeannine Boga M.D.   On: 07/30/2020 00:01    Assessment: 79 y.o. female with a known history of DM, seizure disorder, hypertension, dyslipidemia and stage III chronic kidney disease, GERD and  sleep apnea, who presented to the emergency room with acute onset of left arm weakness and numbness.  Symptoms now improved with her only feeling some abnormal sensation in the left thumb and mild left hand intrinsic weakness.  MRI of the brain personally reviewed and shows no acute changes.  Small vessel ischemic changes are apparent.  Patient on ASA and a statin prior to presentation.   Differential includes TIA and MR negative infarct.  With kyphosis and arthritic changes on examination, can not rule out a cervical etiology as well.  Further work up recommended.   Carotid dopplers show 29-52% LICA stenosis, right shows no evidence of hemodynamically significant stenosis.  Echocardiogram pending.  A1c pending, LDL 51.  Stroke Risk Factors - diabetes mellitus, hyperlipidemia, hypertension  and sleep apnea  Plan: 1. HgbA1c pending with target < 7.0 2. PT consult, OT consult, Speech consult 3. MRI of the cervical spine without contrast 4. Echocardiogram pending 5. Prophylactic therapy-Dual antiplatelet therapy with ASA 55m and Plavix 774mfor three weeks with change to Plavix 7555maily alone as monotherapy after that time. 6. Telemetry monitoring 7. Frequent neuro checks 8. Follow dopplers yearly for left ICA stenosis that appears to currently be asymptomatic.     LesAlexis GoodellD Neurology  07/30/2020, 9:57 AM

## 2020-07-30 NOTE — Progress Notes (Signed)
°   07/29/20 1140  Clinical Encounter Type  Visited With Patient and family together;Health care provider  Visit Type Initial  Referral From Nurse  Consult/Referral To Chaplain  Chaplain responded to CODE STROKE. When she arrived, the neurologist was talking to Pt virtually. After staff finished working with Pt, chaplain briefly spoke to her and her husband. Chaplain was about to ask if she could pray with them, Mr. Benning asked if chaplain would pray. Chaplain prayed and as she was leaving Pt said Merry Christmas.

## 2020-07-30 NOTE — Progress Notes (Signed)
Chart reviewed. Pt admitted with Left arm/hand numbness. Non speech language or swallowing needs at this time. Please reconsult if any speech language or swallowing deficits arise.

## 2020-07-30 NOTE — ED Notes (Signed)
Pt transported up to floor at this time to 1C-109 by RN Bubba Hales

## 2020-07-30 NOTE — ED Notes (Signed)
Pt husband endorsing given 324 oral aspirin at home. MD aware.

## 2020-07-30 NOTE — Care Management Obs Status (Signed)
MEDICARE OBSERVATION STATUS NOTIFICATION   Patient Details  Name: [name] [name] MRN: [id number] Date of Birth: 1941-04-05   Medicare Observation Status Notification Given:  Yes    Shelbie Hutching, RN 07/30/2020, 2:35 PM

## 2020-07-30 NOTE — ED Notes (Signed)
PT at bedside at this time 

## 2020-07-30 NOTE — Progress Notes (Signed)
Per Dr. Tawanna Solo, ok to DC [facility] order set, q2 vital checks and tele monitoring.

## 2020-07-30 NOTE — Progress Notes (Addendum)
Patient is a 79 year old female with history of seizure disorder, hypertension, and hyperlipidemia, stage III CKD, GERD, sleep apnea who presents to the emergency department with complaints of acute onset of left arm weakness/numbness.  On presentation she was hypertensive.  CT head did not show any acute intracranial normalities.  MRI of the brain did not show any acute intracranial normalities.  Carotid Doppler showed minimal-moderate left-sided atherosclerotic plaque with a stenosis of 50 to 69% which has been unchanged since 2018. Neurology consulted.  Stroke work-up initiated.  Currently she does not have any focal neurological deficits.  PT/OT did not recommend any follow-up.  Echocardiogram showed ejection fraction of 55 to 60%, no intracardiac source of emboli. Neurology also requested MRI cervical spine which is pending. Lab work showed severe hypomagnesemia and hypokalemia which have been supplemented. Patient seen and examined the bedside this morning.  She is hemodynamically stable.  When I entered the room, she was being evaluated by neurology, PT/OT. Patient seen by Dr. Sidney Ace last night.  I agree with his assessment and plan.  She has been started on aspirin and Plavix. Full work-up will be completed by tomorrow.  Plan for discharge to home tomorrow.  Will check magnesium and potassium in the morning. Her LDL is 51.  A1c of 6.7.  She may need to be on Metformin on discharge.  Currently she is not taking  any medications at home.

## 2020-07-30 NOTE — ED Provider Notes (Signed)
[facility] Emergency Department Provider Note  ____________________________________________  Time seen: Approximately 12:20 AM  I have reviewed the triage vital signs and the nursing notes.   HISTORY  Chief Complaint Numbness   HPI [name] [name] is a 79 y.o. female with a history of anemia, melanoma, chronic kidney disease, diabetes, hypertension, hyperlipidemia, partial seizure disorder, sleep apnea on CPAP who presents for evaluation of left arm numbness and weakness.  Symptoms started 30 minutes prior to arrival.  Patient denies any involvement of the face or leg.  Denies chest pain, headache, diplopia, dysarthria, dysphagia, gait instability, fever or chills.  Patient denies any prior history of stroke or family history of such.  She is not a smoker.  Patient reports that she has a history of seizure disorder they usually manifest itself with syncopal episodes.  She does not have a history of grand mal seizures.  She has not had any seizures that presented like today.  She denies any syncopal events today.     Past Medical History:  Diagnosis Date  . Anemia   . Arthritis   . Cancer ([place])    melanoma  . Chronic kidney disease    stage 3  . Complication of anesthesia    sometimes hard to wake up  . Diabetes ([place])    diet controlled  . 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 GERD (gastroesophageal reflux disease)   . Headache   . Hyperlipemia   . Hypertension   . Pain    chronic  . Rosacea   . Seizures ([place])    partial epilepsy - none 2 yrs  . Sleep apnea    cpap    Patient Active Problem List   Diagnosis Date Noted  . Aftercare 07/20/2018  . Pain in right knee 06/22/2018  . Sleeping difficulty 06/22/2018  . History of seizure 07/15/2017  . Insomnia 06/16/2017  . Chest pain 08/23/2016  . Weakness of left upper extremity 08/23/2016  . 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 Piriformis syndrome of right  side 08/24/2015  . Impingement syndrome of right shoulder 07/26/2015  . Chronic right shoulder pain 02/07/2015  . Difficulty in walking 09/22/2014  . Diarrhea 09/15/2014  . Lumbosacral spondylosis 12/26/2013  . DDD (degenerative disc disease), lumbar 12/26/2013  . 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 Arthritis, lumbar spine 12/26/2013  . Diabetes ([place]) 11/18/2013  . Dyslipidemia 11/18/2013  . Convulsions, epileptic ([place]) 11/18/2013  . BP (high blood pressure) 11/18/2013  . Obstructive apnea 11/18/2013  . Arthropathy of cervical facet joint 04/27/2012  . Pain in shoulder 03/25/2012  . Cervical spondylosis without myelopathy 12/31/2011  . Cervical pain 12/31/2011    Past Surgical History:  Procedure Laterality Date  . ABDOMINAL HYSTERECTOMY    . BACK SURGERY     lumbar fusion, laminectomy  . BLEPHAROPLASTY Bilateral 10/24/14   Dr. Loni Muse. Vickki Muff, MBSC  . BREAST SURGERY     reduction  . CHOLECYSTECTOMY    . COLONOSCOPY WITH PROPOFOL N/A 03/17/2016   Procedure: COLONOSCOPY WITH PROPOFOL;  Surgeon: Manya Silvas, MD;  Location[facility]COPY;  Service: Endoscopy;  Laterality: N/A;  . rcr    . REDUCTION MAMMAPLASTY Bilateral 1985  . SHOULDER ARTHROSCOPY WITH OPEN ROTATOR CUFF REPAIR Right 06/22/2015   Procedure: SHOULDER ARTHROSCOPY WITH SUBACROMIAL DECOMPRESSION, RELEASE OF LONG HEAD OF BICEPS TENDON, RESECTION OF DISTAL CLAVICLE;  Surgeon: Leanor Kail, MD;  Location: [place];  Service:  Orthopedics;  Laterality: Right;  DIABETIC - diet controlled CPAP   . SKIN CANCER EXCISION     leg, back    Prior to Admission medications   Medication Sig Start Date End Date Taking? Authorizing Provider  acetaminophen (TYLENOL) 500 MG tablet Take 500 mg by mouth every 8 (eight) hours as needed for mild pain.     [provider]  amitriptyline (ELAVIL) 50 MG tablet TAKE 1 TABLET BY MOUTH EVERY DAY AT NIGHT 11/10/18   [provider]  aspirin EC 81 MG tablet  Take 1 tablet (81 mg total) by mouth daily. 08/23/16   Vaughan Basta, MD  atorvastatin (LIPITOR) 80 MG tablet TAKE 1 TABVLET BY MOUTH EVERY DAY 01/14/18   [provider]  Bayer Microlet Lancets lancets USE TO TEST BLOOD SUGAR ONCE DAILY 10/25/19   [provider]  Blood Glucose Monitoring Suppl (GHT BLOOD GLUCOSE MONITOR) w/Device KIT  08/30/19   [provider]  carvedilol (COREG) 12.5 MG tablet Take 12.5 mg by mouth 2 (two) times daily with a meal.    [provider]  diclofenac Sodium (VOLTAREN) 1 % GEL diclofenac 1 % topical gel  APPLY 2 GRAMS TO AREA 4 TIMES A DAY AS NEEDED FOR PAIN    [provider]  doxycycline (VIBRA-TABS) 100 MG tablet Take 1 tablet (100 mg total) by mouth 2 (two) times daily. 03/19/20   Ralene Bathe, MD  ferrous sulfate 325 (65 FE) MG tablet Take 1 tablet (325 mg total) by mouth 2 (two) times daily with a meal. 12/19/18   Salary, Holly Bodily D, MD  furosemide (LASIX) 20 MG tablet Take 20 mg by mouth daily. 10/18/19   [provider]  gabapentin (NEURONTIN) 300 MG capsule TAKE 300 MG AT NIGHT FOR A WEEK THEN INCREASE TO 600 MG AT NIGHT AND CONTINUE THAT DOSE 11/07/19   [provider]  glucose blood (CONTOUR NEXT TEST) test strip by XX route 2 (two) times daily Use as instructed. 10/27/19   [provider]  hydrochlorothiazide (HYDRODIURIL) 25 MG tablet Take 25 mg by mouth daily. 12/14/18   [provider]  lactulose (CHRONULAC) 10 GM/15ML solution lactulose 10 gram/15 mL oral solution 01/20/19   [provider]  levETIRAcetam (KEPPRA) 500 MG tablet Take 500 mg by mouth daily.  08/04/18   [provider]  losartan (COZAAR) 100 MG tablet Take 100 mg by mouth daily. 08/10/18   [provider]  mirabegron ER (MYRBETRIQ) 50 MG TB24 tablet Take 1 tablet (50 mg total) by mouth daily. 11/16/19   Zara Council A, PA-C  MYRBETRIQ 50 MG TB24 tablet TAKE 1 TABLET BY MOUTH EVERY  DAY 04/04/19   McGowan, Larene Beach A, PA-C  omeprazole (PRILOSEC) 40 MG capsule Take 40 mg by mouth daily.  10/11/08   [provider]  spironolactone (ALDACTONE) 25 MG tablet Take 25 mg by mouth daily. 09/01/19   [provider]  traZODone (DESYREL) 50 MG tablet TAKE 1/2 1 TABLETS BY MOUTH AT NIGHT 10/24/19   [provider]  vitamin C (VITAMIN C) 250 MG tablet Take 1 tablet (250 mg total) by mouth 2 (two) times daily. 12/19/18   Salary, Avel Peace, MD    Allergies Contrast media [iodinated diagnostic agents], Tramadol, Adhesive [tape], and Penicillins  Family History  Problem Relation Age of Onset  . Aneurysm Father   . Hypertension Father   . Breast cancer Neg Hx     Social History Social History  Tobacco Use  . Smoking status: Never Smoker  . Smokeless tobacco: Never Used  Vaping Use  . Vaping Use: Never used  Substance Use Topics  . Alcohol use: No  . Drug use: No    Review of Systems  Constitutional: Negative for fever. Eyes: Negative for visual changes. ENT: Negative for sore throat. Neck: No neck pain  Cardiovascular: Negative for chest pain. Respiratory: Negative for shortness of breath. Gastrointestinal: Negative for abdominal pain, vomiting or diarrhea. Genitourinary: Negative for dysuria. Musculoskeletal: Negative for back pain. Skin: Negative for rash. Neurological: Negative for headaches, + LUE weakness and numbness Psych: No SI or HI  ____________________________________________   PHYSICAL EXAM:  VITAL SIGNS: ED Triage Vitals  Enc Vitals Group     BP 07/29/20 2335 (!) 177/58     Pulse Rate 07/29/20 2335 (!) 59     Resp 07/29/20 2335 18     Temp 07/29/20 2335 98 F (36.7 C)     Temp Source 07/29/20 2335 Oral     SpO2 07/29/20 2335 98 %     Weight 07/29/20 2331 133 lb (60.3 kg)     Height 07/29/20 2331 4' 11"  (1.499 m)     Head Circumference --      Peak Flow --      Pain Score 07/29/20 2329 0     Pain Loc --      Pain  Edu? --      Excl. in [place]? --     Constitutional: Alert and oriented. Well appearing and in no apparent distress. HEENT:      Head: Normocephalic and atraumatic.         Eyes: Conjunctivae are normal. Sclera is non-icteric.       Mouth/Throat: Mucous membranes are moist.       Neck: Supple with no signs of meningismus. Cardiovascular: Regular rate and rhythm. No murmurs, gallops, or rubs. 2+ symmetrical distal pulses are present in all extremities.  Respiratory: Normal respiratory effort. Lungs are clear to auscultation bilaterally.  Gastrointestinal: Soft, non tender, and non distended Musculoskeletal: No edema, cyanosis, or erythema of extremities. Neurologic: Normal speech and language. EOMI, PERRL, Face is symmetric, no dysmetria or pronator drift, patient reports mild numbness to touch of the LUE and has 4/5 strength on the LUE. 5/5 strength in all other extremities. Skin: Skin is warm, dry and intact. No rash noted. Psychiatric: Mood and affect are normal. Speech and behavior are normal.   NIH Stroke Scale  Interval: Baseline Time: 12:32 AM Person Administering Scale: [place] stroke scale items in the order listed. Record performance in each category after each subscale exam. Do not go back and change scores. Follow directions provided for each exam technique. Scores should reflect what the patient does, not what the clinician thinks the patient can do. The clinician should record answers while administering the exam and work quickly. Except where indicated, the patient should not be coached (i.e., repeated requests to patient to make a special effort).   1a  Level of consciousness: 0=alert; keenly responsive  1b. LOC questions:  0=Performs both tasks correctly  1c. LOC commands: 0=Performs both tasks correctly  2.  Best Gaze: 0=normal  3.  Visual: 0=No visual loss  4. Facial Palsy: 0=Normal symmetric movement  5a.  Motor left arm: 0=No drift, limb holds  90 (or 45) degrees for full 10 seconds  5b.  Motor right arm: 0=No drift, limb holds 90 (or 45) degrees for full 10 seconds  6a. motor left leg: 0=No drift, limb holds 90 (or 45) degrees for full 10 seconds  6b  Motor right leg:  0=No drift, limb holds 90 (or 45) degrees for full 10 seconds  7. Limb Ataxia: 0=Absent  8.  Sensory: 1=Mild to moderate sensory loss; patient feels pinprick is less sharp or is dull on the affected side; there is a loss of superficial pain with pinprick but patient is aware She is being touched  9. Best Language:  0=No aphasia, normal  10. Dysarthria: 0=Normal  11. Extinction and Inattention: 0=No abnormality   Total:   1    ____________________________________________   LABS (all labs ordered are listed, but only abnormal results are displayed)  Labs Reviewed  COMPREHENSIVE METABOLIC PANEL - Abnormal; Notable for the following components:      Result Value   Potassium 2.9 (*)    Glucose, Bld 119 (*)    Calcium 7.3 (*)    All other components within normal limits  CBG MONITORING, ED - Abnormal; Notable for the following components:   Glucose-Capillary 112 (*)    All other components within normal limits  RESP PANEL BY RT-PCR (FLU A&B, COVID) ARPGX2  PROTIME-INR  APTT  CBC  DIFFERENTIAL  I-STAT CREATININE, ED   ____________________________________________  EKG  ED ECG REPORT I, Rudene Re, the attending physician, personally viewed and interpreted this ECG.  Normal sinus rhythm, rate of 58, normal intervals, normal axis, no ST elevations or depressions.  Unchanged from prior from June 2020 ____________________________________________  RADIOLOGY  I have personally reviewed the images performed during this visit and I agree with the Radiologist's read.   Interpretation by Radiologist:  CT HEAD CODE STROKE WO CONTRAST  Result Date: 07/30/2020 CLINICAL DATA:  Code stroke. Initial evaluation for acute stroke, left-sided numbness. EXAM:  CT HEAD WITHOUT CONTRAST TECHNIQUE: Contiguous axial images were obtained from the base of the skull through the vertex without intravenous contrast. COMPARISON:  Prior MRI from 08/23/2016. FINDINGS: Brain: Cerebral volume stable, and remains within normal limits for age. Patchy hypodensity involving the supratentorial cerebral white matter noted, nonspecific, but overall grossly similar and stable from previous. No acute intracranial hemorrhage. No acute large vessel territory infarct. No mass lesion, midline shift or mass effect. No hydrocephalus or extra-axial fluid collection. Vascular: No hyperdense vessel. Skull: Scalp soft tissues and calvarium within normal limits. Sinuses/Orbits: Globes and orbital soft tissues demonstrate no acute finding. Sequelae of prior left orbital floor repair noted. Scattered mucosal thickening noted within the ethmoidal air cells. Paranasal sinuses are otherwise clear. No mastoid effusion. Other: None. ASPECTS [facility] Stroke Program Early CT Score) - Ganglionic level infarction (caudate, lentiform nuclei, internal capsule, insula, M1-M3 cortex): 7 - Supraganglionic infarction (M4-M6 cortex): 3 Total score (0-10 with 10 being normal): 10 IMPRESSION: 1. No acute intracranial infarct or other abnormality. 2. ASPECTS is 10. 3. Patchy nonspecific supratentorial cerebral white matter disease, nonspecific, but overall grossly similar to previous exams. Critical Value/emergent results were called by telephone at the time of interpretation on 07/29/2020 at 11:58 pm to provider Rudene Re , who verbally acknowledged these results. Electronically Signed   By: Jeannine Boga M.D.   On: 07/30/2020 00:01     ____________________________________________   PROCEDURES  Procedure(s) performed:yes .1-3 Lead EKG Interpretation Performed by: Rudene Re, MD Authorized by: Rudene Re, MD     Interpretation: normal     ECG rate assessment: normal     Rhythm:  sinus rhythm     Ectopy: none  Critical Care performed:  None ____________________________________________   INITIAL IMPRESSION / ASSESSMENT AND PLAN / ED COURSE  79 y.o. female with a history of anemia, melanoma, chronic kidney disease, diabetes, hypertension, hyperlipidemia, partial seizure disorder, sleep apnea on CPAP who presents for evaluation of left arm numbness and weakness. Last known normal was 11PM.  Patient with mild decreased sensation to touch of the left upper extremity and 4 out of 5 weakness but no pronator drift or dysmetria observed.  Patient was made a code stroke.  Normal glucose.  Patient was taken straight to CAT scan.  CT visualized by me with no acute findings, confirmed by radiology.  Patient was seen by teleneurologist who recommended giving 1 mg of IV Ativan, full dose aspirin, and MRI to try to differentiate between epileptic activity versus a stroke.  Teleneurologist more concerned that this is epileptic in nature.  He did recommend admission to the hospital for full stroke evaluation.  Patient received 325 mg of aspirin per her husband at the onset of symptoms.  She passed swallow evaluation.  Her labs were reviewed by me showing hypokalemia with no EKG changes.  Will supplement p.o. and IV.  No other acute abnormalities.  Old medical records reviewed including notes from most recent visit to her Neurologist Dr. Melrose Nakayama on 05/2020.  History gathered from patient and her husband was at bedside.  Plan discussed with both of them were in agreement.  Will discuss to hospitalist for admission  Ddx CVA vs seizure vs muscle spasms from hypokalemia.      _____________________________________________ Please note:  Patient was evaluated in Emergency Department today for the symptoms described in the history of present illness. Patient was evaluated in the context of the global COVID-19 pandemic, which necessitated consideration that the patient might be at risk for infection  with the SARS-CoV-2 virus that causes COVID-19. Institutional protocols and algorithms that pertain to the evaluation of patients at risk for COVID-19 are in a state of rapid change based on information released by regulatory bodies including the CDC and federal and state organizations. These policies and algorithms were followed during the patient's care in the ED.  Some ED evaluations and interventions may be delayed as a result of limited staffing during the pandemic.   [place] Controlled Substance Database was reviewed by me. ____________________________________________   FINAL CLINICAL IMPRESSION(S) / ED DIAGNOSES   Final diagnoses:  Left arm numbness  Left arm weakness  Hypokalemia      NEW MEDICATIONS STARTED DURING THIS VISIT:  ED Discharge Orders    None       Note:  This document was prepared using Dragon voice recognition software and may include unintentional dictation errors.    Rudene Re, MD 07/30/20 [phone]

## 2020-07-30 NOTE — H&P (Signed)
[place]   PATIENT NAME: [name] [name]    MR#:  [id number]  DATE OF BIRTH:  1941/02/13  DATE OF ADMISSION:  07/29/2020  PRIMARY CARE PHYSICIAN: Kirk Ruths, MD   REQUESTING/REFERRING PHYSICIAN: Gonzella Lex, MD  CHIEF COMPLAINT:   Chief Complaint  Patient presents with  . Numbness    HISTORY OF PRESENT ILLNESS:  [name] [name]  is a 79 y.o. Caucasian female with a known history of seizure disorder, hypertension, dyslipidemia and stage III chronic kidney disease, GERD and sleep apnea, who presented to the emergency room with acute onset of left arm weakness and numbness that started around 11 PM.  She denies any lower extremity paresthesias and denies any headache or dizziness or blurred vision.  No witnessed seizures.  No nausea or vomiting or abdominal pain.  No presyncope or syncope.  No tinnitus or vertigo.  She denies any chest pain or dyspnea or palpitations.  No fever or chills.  Upon presentation to the emergency room, blood pressure was 177/58 with a heart rate of 59 with otherwise normal vital signs.  BP later on was 150/61.  Labs revealed hypokalemia of 2.9 and a calcium of 7.3 with albumin 3.7 with unremarkable CBC. EKG showed mild sinus bradycardia with rate of 58 with nonspecific intraventricular conduction delay.  Uncontrasted head CT scan revealed no acute intracranial normalities.  It showed patchy nonspecific supratentorial cerebral white matter disease that is nonspecific but overall grossly similar to previous exams.  The patient was given 1 mg of IV Ativan, 40 mEq p.o. potassium chloride and 10 mcg IV potassium chloride.  She will be admitted to a an observation medically monitored bed for further evaluation and management.  PAST MEDICAL HISTORY:   Past Medical History:  Diagnosis Date  . Anemia   . Arthritis   . Cancer ([place])    melanoma  . Chronic kidney disease    stage 3  . Complication of anesthesia    sometimes hard to wake up   . Diabetes ([place])    diet controlled  . 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 GERD (gastroesophageal reflux disease)   . Headache   . Hyperlipemia   . Hypertension   . Pain    chronic  . Rosacea   . Seizures ([place])    partial epilepsy - none 2 yrs  . Sleep apnea    cpap    PAST SURGICAL HISTORY:   Past Surgical History:  Procedure Laterality Date  . ABDOMINAL HYSTERECTOMY    . BACK SURGERY     lumbar fusion, laminectomy  . BLEPHAROPLASTY Bilateral 10/24/14   Dr. Loni Muse. Vickki Muff, MBSC  . BREAST SURGERY     reduction  . CHOLECYSTECTOMY    . COLONOSCOPY WITH PROPOFOL N/A 03/17/2016   Procedure: COLONOSCOPY WITH PROPOFOL;  Surgeon: Manya Silvas, MD;  Location: [facility] ENDOSCOPY;  Service: Endoscopy;  Laterality: N/A;  . rcr    . REDUCTION MAMMAPLASTY Bilateral 1985  . SHOULDER ARTHROSCOPY WITH OPEN ROTATOR CUFF REPAIR Right 06/22/2015   Procedure: SHOULDER ARTHROSCOPY WITH SUBACROMIAL DECOMPRESSION, RELEASE OF LONG HEAD OF BICEPS TENDON, RESECTION OF DISTAL CLAVICLE;  Surgeon: Leanor Kail, MD;  Location: [place];  Service: Orthopedics;  Laterality: Right;  DIABETIC - diet controlled CPAP   . SKIN CANCER EXCISION     leg, back    SOCIAL HISTORY:   Social History   Tobacco Use  . Smoking status: Never  Smoker  . Smokeless tobacco: Never Used  Substance Use Topics  . Alcohol use: No    FAMILY HISTORY:   Family History  Problem Relation Age of Onset  . Aneurysm Father   . Hypertension Father   . Breast cancer Neg Hx     DRUG ALLERGIES:   Allergies  Allergen Reactions  . Contrast Media [Iodinated Diagnostic Agents] Other (See Comments)    Made hands and feet "draw up"  . Tramadol Other (See Comments)    Other Reaction: OTHER REACTION, SOB, AGITATIO  . Adhesive [Tape] Rash    Some bandaids  . Penicillins Rash and Other (See Comments)    Has patient had a PCN reaction causing immediate rash, facial/tongue/throat swelling, SOB  or lightheadedness with hypotension: Yes Has patient had a PCN reaction causing severe rash involving mucus membranes or skin necrosis: No Has patient had a PCN reaction that required hospitalization No Has patient had a PCN reaction occurring within the last 10 years: No If all of the above answers are "NO", then may proceed with Cephalosporin use.     REVIEW OF SYSTEMS:   ROS As per history of present illness. All pertinent systems were reviewed above. Constitutional, HEENT, cardiovascular, respiratory, GI, GU, musculoskeletal, neuro, psychiatric, endocrine, integumentary and hematologic systems were reviewed and are otherwise negative/unremarkable except for positive findings mentioned above in the HPI.   MEDICATIONS AT HOME:   Prior to Admission medications   Medication Sig Start Date End Date Taking? Authorizing Provider  acetaminophen (TYLENOL) 500 MG tablet Take 500 mg by mouth every 8 (eight) hours as needed for mild pain.     [provider]  amitriptyline (ELAVIL) 50 MG tablet TAKE 1 TABLET BY MOUTH EVERY DAY AT NIGHT 11/10/18   [provider]  aspirin EC 81 MG tablet Take 1 tablet (81 mg total) by mouth daily. 08/23/16   Vaughan Basta, MD  atorvastatin (LIPITOR) 80 MG tablet TAKE 1 TABVLET BY MOUTH EVERY DAY 01/14/18   [provider]  Bayer Microlet Lancets lancets USE TO TEST BLOOD SUGAR ONCE DAILY 10/25/19   [provider]  Blood Glucose Monitoring Suppl (GHT BLOOD GLUCOSE MONITOR) w/Device KIT  08/30/19   [provider]  carvedilol (COREG) 12.5 MG tablet Take 12.5 mg by mouth 2 (two) times daily with a meal.    [provider]  diclofenac Sodium (VOLTAREN) 1 % GEL diclofenac 1 % topical gel  APPLY 2 GRAMS TO AREA 4 TIMES A DAY AS NEEDED FOR PAIN    [provider]  doxycycline (VIBRA-TABS) 100 MG tablet Take 1 tablet (100 mg total) by mouth 2 (two) times daily. 03/19/20   Ralene Bathe, MD  ferrous  sulfate 325 (65 FE) MG tablet Take 1 tablet (325 mg total) by mouth 2 (two) times daily with a meal. 12/19/18   Salary, Holly Bodily D, MD  furosemide (LASIX) 20 MG tablet Take 20 mg by mouth daily. 10/18/19   [provider]  gabapentin (NEURONTIN) 300 MG capsule TAKE 300 MG AT NIGHT FOR A WEEK THEN INCREASE TO 600 MG AT NIGHT AND CONTINUE THAT DOSE 11/07/19   [provider]  glucose blood (CONTOUR NEXT TEST) test strip by XX route 2 (two) times daily Use as instructed. 10/27/19   [provider]  hydrochlorothiazide (HYDRODIURIL) 25 MG tablet Take 25 mg by mouth daily. 12/14/18   [provider]  lactulose (CHRONULAC) 10 GM/15ML solution lactulose 10 gram/15 mL oral solution 01/20/19  [provider]  levETIRAcetam (KEPPRA) 500 MG tablet Take 500 mg by mouth daily.  08/04/18   [provider]  losartan (COZAAR) 100 MG tablet Take 100 mg by mouth daily. 08/10/18   [provider]  mirabegron ER (MYRBETRIQ) 50 MG TB24 tablet Take 1 tablet (50 mg total) by mouth daily. 11/16/19   Zara Council A, PA-C  MYRBETRIQ 50 MG TB24 tablet TAKE 1 TABLET BY MOUTH EVERY DAY 04/04/19   McGowan, Larene Beach A, PA-C  omeprazole (PRILOSEC) 40 MG capsule Take 40 mg by mouth daily.  10/11/08   [provider]  spironolactone (ALDACTONE) 25 MG tablet Take 25 mg by mouth daily. 09/01/19   [provider]  traZODone (DESYREL) 50 MG tablet TAKE 1/2 1 TABLETS BY MOUTH AT NIGHT 10/24/19   [provider]  vitamin C (VITAMIN C) 250 MG tablet Take 1 tablet (250 mg total) by mouth 2 (two) times daily. 12/19/18   Salary, Avel Peace, MD      VITAL SIGNS:  Blood pressure (!) 150/61, pulse (!) 58, temperature 98 F (36.7 C), temperature source Oral, resp. rate 16, height 4' 11"  (1.499 m), weight 60.3 kg, SpO2 99 %.  PHYSICAL EXAMINATION:  Physical Exam  GENERAL:  79 y.o.-year-old Caucasian female patient lying in the bed with no acute distress.  EYES:  Pupils equal, round, reactive to light and accommodation. No scleral icterus. Extraocular muscles intact.  HEENT: Head atraumatic, normocephalic. Oropharynx and nasopharynx clear.  NECK:  Supple, no jugular venous distention. No thyroid enlargement, no tenderness.  LUNGS: Normal breath sounds bilaterally, no wheezing, rales,rhonchi or crepitation. No use of accessory muscles of respiration.  CARDIOVASCULAR: Regular rate and rhythm, S1, S2 normal. No murmurs, rubs, or gallops.  ABDOMEN: Soft, nondistended, nontender. Bowel sounds present. No organomegaly or mass.  EXTREMITIES: No pedal edema, cyanosis, or clubbing.  NEUROLOGIC: Cranial nerves II through XII are intact. Muscle strength 5/5 in all extremities except for the left upper extremity. 4/5. Sensation intact throughout except for minimal diminished sensation to light touch in the left upper extremity. Gait not checked.  PSYCHIATRIC: The patient is alert and oriented x 3.  Normal affect and good eye contact. SKIN: No obvious rash, lesion, or ulcer.   LABORATORY PANEL:   CBC Recent Labs  Lab 07/29/20 2339  WBC 8.3  HGB 12.2  HCT 37.2  PLT 184   ------------------------------------------------------------------------------------------------------------------  Chemistries  Recent Labs  Lab 07/29/20 2339  NA 142  K 2.9*  CL 103  CO2 28  GLUCOSE 119*  BUN 15  CREATININE 0.60  CALCIUM 7.3*  AST 25  ALT 29  ALKPHOS 77  BILITOT 0.8   ------------------------------------------------------------------------------------------------------------------  Cardiac Enzymes No results for input(s): TROPONINI in the last 168 hours. ------------------------------------------------------------------------------------------------------------------  RADIOLOGY:  CT HEAD CODE STROKE WO CONTRAST  Result Date: 07/30/2020 CLINICAL DATA:  Code stroke. Initial evaluation for acute stroke, left-sided numbness. EXAM: CT HEAD WITHOUT CONTRAST  TECHNIQUE: Contiguous axial images were obtained from the base of the skull through the vertex without intravenous contrast. COMPARISON:  Prior MRI from 08/23/2016. FINDINGS: Brain: Cerebral volume stable, and remains within normal limits for age. Patchy hypodensity involving the supratentorial cerebral white matter noted, nonspecific, but overall grossly similar and stable from previous. No acute intracranial hemorrhage. No acute large vessel territory infarct. No mass lesion, midline shift or mass effect. No hydrocephalus or extra-axial fluid collection. Vascular: No hyperdense vessel. Skull: Scalp soft tissues and calvarium within normal limits. Sinuses/Orbits: Globes and orbital soft  tissues demonstrate no acute finding. Sequelae of prior left orbital floor repair noted. Scattered mucosal thickening noted within the ethmoidal air cells. Paranasal sinuses are otherwise clear. No mastoid effusion. Other: None. ASPECTS [facility] Stroke Program Early CT Score) - Ganglionic level infarction (caudate, lentiform nuclei, internal capsule, insula, M1-M3 cortex): 7 - Supraganglionic infarction (M4-M6 cortex): 3 Total score (0-10 with 10 being normal): 10 IMPRESSION: 1. No acute intracranial infarct or other abnormality. 2. ASPECTS is 10. 3. Patchy nonspecific supratentorial cerebral white matter disease, nonspecific, but overall grossly similar to previous exams. Critical Value/emergent results were called by telephone at the time of interpretation on 07/29/2020 at 11:58 pm to provider Rudene Re , who verbally acknowledged these results. Electronically Signed   By: Jeannine Boga M.D.   On: 07/30/2020 00:01      IMPRESSION AND PLAN:   1.  Left upper extremity weakness and numbness, rule out acute CVA. -The patient will be admitted to an observation medical monitored bed. -We will follow neuro checks every 4 hours for 24 hours. -We will add Plavix to her baby aspirin. -Neurology consultation will be  obtained. -I notified Dr. Cleda Mccreedy about the patient. -PT/OT and ST consults will be obtained. -We will obtain a brain MRI without contrast as well as bilateral carotid Doppler and 2D echo for further assessment.  2.  Hypokalemia. -Potassium will be replaced and magnesium level will be checked.  3.  Seizure disorder. -She will be placed on as needed IV Ativan and continue her Depakote.  4.  Essential hypertension. -We will continue her antihypertensives with permissive parameters.  5.  Dyslipidemia. -Continue her fenofibrate as well as statin therapy and check fasting lipids.  6.  Type 2 diabetes mellitus with peripheral neuropathy. -She will be placed on supplement coverage with NovoLog and will continue Neurontin.  7.  DVT prophylaxis. -Subcutaneous Lovenox.   All the records are reviewed and case discussed with ED provider. The plan of care was discussed in details with the patient (and family). I answered all questions. The patient agreed to proceed with the above mentioned plan. Further management will depend upon hospital course.   CODE STATUS: Full code  Status is: Observation  The patient remains OBS appropriate and will d/c before 2 midnights.  Dispo: The patient is from: Home              Anticipated d/c is to: Home              Anticipated d/c date is: 1 day              Patient currently is not medically stable to d/c.   TOTAL TIME TAKING CARE OF THIS PATIENT: 50 minutes.    Christel Mormon M.D on 07/30/2020 at 12:45 AM  [facility]   From 7 PM-7 AM, contact night-coverage [email or web address]  CC: Primary care physician; Kirk Ruths, MD

## 2020-07-30 NOTE — ED Notes (Signed)
Pt endorsing symptoms in L hand have all but resolved "except for the thumb"

## 2020-07-30 NOTE — ED Notes (Signed)
Admitting MD Mansy messaged at this time regarding pt c/o returning numbness of L hand. Awaiting response at this time.

## 2020-07-30 NOTE — ED Notes (Signed)
Echo at bedside at this time

## 2020-07-30 NOTE — ED Notes (Signed)
Meal tray given at this time 

## 2020-07-30 NOTE — Consult Note (Signed)
[facility] Tele[facility]urology Consult Services   Date of Service:   07/29/2020 23:41:16  Diagnosis:       R20.2 - Paresthesia of skin  Impression: Patient with history of Diabetes Mellitus, hypertension, seizures Presents with left arm numbness and left hand tightened up. Head CT: No acute Intracranial hemorrhage. NIHSS 1 Etiology for Symptoms/presentation is unclear, the spasm in left hand unlikely to be due to acute ischemic stroke and appears more like an epileptic phenomenon (but not similar to her previous seizures), overall stroke is less suspected and IV Thrombolysis is not recommended. Symptoms not suggestive of Large Vessel Occlusion. Should do a trial of IV Ativan 2mg  x1 in ER and assess response. Will be admitted for stroke workup.  Metrics: Last Known Well: 07/29/2020 22:45:00 [facility] Notification Time: 07/29/2020 23:41:16 Arrival Time: 07/29/2020 23:25:00 Stamp Time: 07/29/2020 23:41:16 Initial Response Time: 07/29/2020 23:49:41 Time First Login Attempt: 07/29/2020 23:49:41 Symptoms: left arm numbness. NIHSS Start Assessment Time: 07/29/2020 23:52:11 Patient is not a candidate for Thrombolytic. Thrombolytic Medical Decision: 07/29/2020 23:59:00 Patient was not deemed candidate for Thrombolytic because of following reasons: Other Diagnosis suspected.  CT head was reviewed.  ED Physician notified of diagnostic impression and management plan on 07/30/2020 00:10:19  Advanced Imaging: Advanced Imaging Not Recommended because:  Clinical Presentation is not Suggestive of LVO and NIHSS is <6   Our recommendations are outlined below.  Recommendations:       Activate Stroke Protocol Admission/Order Set       Stroke/Telemetry Floor       Neuro Checks       Bedside Swallow Eval       DVT Prophylaxis       IV Fluids, Normal Saline       Head of Bed 30 Degrees       Euglycemia and Avoid Hyperthermia (PRN Acetaminophen)       Antiplatelet  Therapy Recommended       trial of ativan 2mg  IV x1 in ER.  Routine Consultation with [facility] for Follow up Care  Sign Out:       Discussed with Emergency Department Provider    ------------------------------------------------------------------------------  History of Present Illness: Patient is a 79 year old Female.  Patient was brought by private transportation with symptoms of left arm numbness.  Patient with history of Diabetes Mellitus, hypertension, seizures last known well per patient and Presenting symptoms/Chief complaint/reason for consult: 22:45 Developed left arm numbness and left hand tightened up. Chart reviewed for Pertinent medical, surgical, family history. Chart reviewed for Medications list and allergies. Pertinent positive and negative review of systems noted in History of Present Illness. When possible Patient/Family Consented to Telemedicine evaluation. When applicable I personally reviewed images of Head CT or MRI.   Past Medical History:      Hypertension      Diabetes Mellitus  Anticoagulant use:  No  Antiplatelet use: Yes aspirin  Allergies:  Reviewed    Examination: BP(182/71), Pulse(73), Blood Glucose(112) 1A: Level of Consciousness - Alert; keenly responsive + 0 1B: Ask Month and Age - Both Questions Right + 0 1C: Blink Eyes & Squeeze Hands - Performs Both Tasks + 0 2: Test Horizontal Extraocular Movements - Normal + 0 3: Test Visual Fields - No Visual Loss + 0 4: Test Facial Palsy (Use Grimace if Obtunded) - Normal symmetry + 0 5A: Test Left Arm Motor Drift - No Drift for 10 Seconds + 0 5B: Test Right Arm Motor Drift - No Drift for 10 Seconds +  0 6A: Test Left Leg Motor Drift - No Drift for 5 Seconds + 0 6B: Test Right Leg Motor Drift - No Drift for 5 Seconds + 0 7: Test Limb Ataxia (FNF/Heel-Shin) - No Ataxia + 0 8: Test Sensation - Mild-Moderate Loss: Less Sharp/More Dull + 1 9: Test Language/Aphasia - Normal; No aphasia +  0 10: Test Dysarthria - Normal + 0 11: Test Extinction/Inattention - No abnormality + 0  NIHSS Score: 1  Pre-Morbid Modified Rankin Scale: 1 Points = No significant disability despite symptoms; able to carry out all usual duties and activities   Patient/Family was informed the Neurology Consult would occur via TeleHealth consult by way of interactive audio and video telecommunications and consented to receiving care in this manner.   Patient is being evaluated for possible acute neurologic impairment and high probability of imminent or life-threatening deterioration. I spent total of 30 minutes providing care to this patient, including time for face to face visit via telemedicine, review of medical records, imaging studies and discussion of findings with providers, the patient and/or family.   Dr Elenor Quinones   [facility] [phone]  Case [id number]

## 2020-07-30 NOTE — Evaluation (Signed)
Physical Therapy Evaluation Patient Details Name: [name] [name] MRN: [id number] DOB: 07/14/1941 Today's Date: 07/30/2020   History of Present Illness  Pt is a 79 y.o. Caucasian female with a known history of seizure disorder, hypertension, dyslipidemia and stage III chronic kidney disease, GERD and sleep apnea, who presented to the emergency room with acute onset of left arm weakness and numbness that started around 11 PM. MRI negative for acute infarct.    Clinical Impression  Pt alert, agreeable to PT, denied pain. Able to provide clear PLOF as well as incident that led to admission; pt reported near resolvment of UE symptoms, complained of residual L thumb weakness/tingling. Upon assessment the pt demonstrated LE strength WFLs, decreased L touch to L thumb/index finger, as well as decreased L thumb strength. She peformed bed mobility with supervision, and sit <> stand with SPC and with RW. Pt reported improved safety with RW (utilizes rollator for in home mobility at baseline). Pt ambulated ~22ft with RW and supervision/CGA, and performed toileting with supervision as well. Overall the patient demonstrated mild deficits and near return to baseline level of functioning. Recommendation is HHPT to maximize safety, mobility, and activity tolerance/strength.     Follow Up Recommendations Home health PT    Equipment Recommendations  None recommended by PT    Recommendations for Other Services       Precautions / Restrictions Precautions Precautions: Fall Restrictions Weight Bearing Restrictions: No      Mobility  Bed Mobility Overal bed mobility: Needs Assistance Bed Mobility: Supine to Sit;Sit to Supine     Supine to sit: Supervision Sit to supine: Supervision        Transfers Overall transfer level: Needs assistance Equipment used: Rolling walker (2 wheeled);Straight cane Transfers: Sit to/from Stand Sit to Stand: Supervision;Min guard         General transfer  comment: able to perform with both straight cane and RW, pt endorsed improved comfort with RW (uses rollator for in home mobility)  Ambulation/Gait   Gait Distance (Feet): 30 Feet Assistive device: Rolling walker (2 wheeled) Gait Pattern/deviations: WFL(Within Functional Limits) Gait velocity: decreased      Stairs            Wheelchair Mobility    Modified Rankin (Stroke Patients Only)       Balance Overall balance assessment: Needs assistance Sitting-balance support: Feet unsupported Sitting balance-Leahy Scale: Normal       Standing balance-Leahy Scale: Good Standing balance comment: able to wash hands at the sink with single UE support;                             Pertinent Vitals/Pain Pain Assessment: No/denies pain    Home Living Family/patient expects to be discharged to:: Private residence Living Arrangements: Spouse/significant other Available Help at Discharge: Family;Available 24 hours/day Type of Home: House Home Access: Stairs to enter Entrance Stairs-Rails: Left Entrance Stairs-Number of Steps: 5-6 Home Layout: Two level;Able to live on main level with bedroom/bathroom Home Equipment: Shower seat - built in;Grab bars - tub/shower;Cane - single point;Other (comment);Walker - 4 wheels Additional Comments: has rollator    Prior Function Level of Independence: Independent with assistive device(s)         Comments: Pt indep with rollator in the home and for dr appts, uses [facility] for quick trips otherwise, indep with ADL, 1 fall in 10/2019     Hand Dominance   Dominant Hand: Right  Extremity/Trunk Assessment   Upper Extremity Assessment Upper Extremity Assessment: Defer to OT evaluation LUE Deficits / Details: grossly 4+/5, pt endorses thumb still "doesn't feel right" but improved from before; [place] and [facility] WFL LUE Sensation: decreased light touch    Lower Extremity Assessment Lower Extremity Assessment: Overall WFL for tasks  assessed (grossly 4+/5, minimal pain with r knee extension due to arthritis)    Cervical / Trunk Assessment Cervical / Trunk Assessment: Kyphotic  Communication   Communication: HOH (very mildly HOH)  Cognition Arousal/Alertness: Awake/alert Behavior During Therapy: WFL for tasks assessed/performed Overall Cognitive Status: Within Functional Limits for tasks assessed                                        General Comments      Exercises Other Exercises Other Exercises: Pt educated on benefit of follow up from PT/OT services for strengthening as needed Other Exercises: vitals WFLs throughout session Other Exercises: supervision for toileting/pericare care on standard commode   Assessment/Plan    PT Assessment Patient needs continued PT services  PT Problem List Decreased strength       PT Treatment Interventions DME instruction;Therapeutic exercise;Gait training;Balance training;Stair training;Functional mobility training;Neuromuscular re-education;Therapeutic activities;Patient/family education    PT Goals (Current goals can be found in the Care Plan section)  Acute Rehab PT Goals Patient Stated Goal: go home PT Goal Formulation: With patient Time For Goal Achievement: 08/13/20 Potential to Achieve Goals: Good    Frequency Min 2X/week   Barriers to discharge        Co-evaluation               AM-PAC PT "6 Clicks" Mobility  Outcome Measure Help needed turning from your back to your side while in a flat bed without using bedrails?: None Help needed moving from lying on your back to sitting on the side of a flat bed without using bedrails?: None Help needed moving to and from a bed to a chair (including a wheelchair)?: None Help needed standing up from a chair using your arms (e.g., wheelchair or bedside chair)?: None Help needed to walk in hospital room?: None Help needed climbing 3-5 steps with a railing? : A Little 6 Click Score: 23     End of Session Equipment Utilized During Treatment: Gait belt Activity Tolerance: Patient tolerated treatment well Patient left: in bed;with call bell/phone within reach Nurse Communication: Mobility status PT Visit Diagnosis: Other abnormalities of gait and mobility (R26.89)    Time: 1028-1050 PT Time Calculation (min) (ACUTE ONLY): 22 min   Charges:   PT Evaluation $PT Eval Low Complexity: 1 Low PT Treatments $Therapeutic Activity: 8-22 mins       Lieutenant Diego PT, DPT 11:11 AM,07/30/20

## 2020-07-30 NOTE — TOC Initial Note (Signed)
Transition of Care [facility]) - Initial/Assessment Note    Patient Details  Name: [name] [name] MRN: [id number] Date of Birth: [birth year]  Transition of Care [facility]) CM/SW Contact:    Shelbie Hutching, RN Phone Number: 07/30/2020, 3:24 PM  Clinical Narrative:                 Patient placed under observation for left arm numbness no evidence of stroke on imaging.  RNCM met with patient at the bedside.  Patient is from home with her husband.  Patient reports that she uses a cane and rollator at home.  Patient reports that she has been referred for OP physical therapy by her PCP office and would like to follow up with that using her current referral.  Patient declines the home health services.  Patient can drive but husband does most of the driving.   Patient is current with her PCP, Dr. Ouida Sills and uses [facility] for prescriptions medications.    Expected Discharge Plan: OP Rehab Barriers to Discharge: Continued Medical Work up   Patient Goals and CMS Choice Patient states their goals for this hospitalization and ongoing recovery are:: To go home and follow up with OP PT CMS Medicare.gov Compare Post Acute Care list provided to:: Patient Choice offered to / list presented to : Patient  Expected Discharge Plan and Services Expected Discharge Plan: OP Rehab   Discharge Planning Services: CM Consult Post Acute Care Choice:  (OP PT)                   DME Arranged: N/A DME Agency: NA       HH Arranged: Patient Refused HH          Prior Living Arrangements/Services   Lives with:: Spouse Patient language and need for interpreter reviewed:: Yes Do you feel safe going back to the place where you live?: Yes      Need for Family Participation in Patient Care: Yes (Comment) (left arm numbness) Care giver support system in place?: Yes (comment) (husband) Current home services: DME (rollator and cane) Criminal Activity/Legal Involvement Pertinent to Current Situation/Hospitalization: No -  Comment as needed  Activities of Daily Living Home Assistive Devices/Equipment: Cane (specify quad or straight) ADL Screening (condition at time of admission) Patient's cognitive ability adequate to safely complete daily activities?: Yes Is the patient deaf or have difficulty hearing?: No Does the patient have difficulty seeing, even when wearing glasses/contacts?: No Does the patient have difficulty concentrating, remembering, or making decisions?: No Patient able to express need for assistance with ADLs?: Yes Does the patient have difficulty dressing or bathing?: No Independently performs ADLs?: Yes (appropriate for developmental age) Does the patient have difficulty walking or climbing stairs?: No Weakness of Legs: None Weakness of Arms/Hands: Left  Permission Sought/Granted Permission sought to share information with : Case Manager,Family Supports Permission granted to share information with : Yes, Verbal Permission Granted  Share Information with NAME: Kyung Rudd     Permission granted to share info w Relationship: husband     Emotional Assessment Appearance:: Appears stated age Attitude/Demeanor/Rapport: Engaged Affect (typically observed): Accepting Orientation: : Oriented to Self,Oriented to Place,Oriented to  Time,Oriented to Situation Alcohol / Substance Use: Not Applicable Psych Involvement: No (comment)  Admission diagnosis:  TIA (transient ischemic attack) [G45.9] Hypokalemia [E87.6] Left arm numbness [R20.0] Left arm weakness [R29.898] Patient Active Problem List   Diagnosis Date Noted   Left arm numbness 07/30/2020   Aftercare 07/20/2018   Pain in right knee  06/22/2018   Sleeping difficulty 06/22/2018   History of seizure 07/15/2017   Insomnia 06/16/2017   Chest pain 08/23/2016   Weakness of left upper extremity 08/23/2016   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/27/2015   Piriformis syndrome of right side 08/24/2015   Impingement syndrome of right  shoulder 07/26/2015   Chronic right shoulder pain 02/07/2015   Difficulty in walking 09/22/2014   Diarrhea 09/15/2014   Lumbosacral spondylosis 12/26/2013   DDD (degenerative disc disease), lumbar 12/26/2013   Degenerative arthritis of hip 12/26/2013   Arthritis, lumbar spine 12/26/2013   Diabetes ([place]) 11/18/2013   Dyslipidemia 11/18/2013   Convulsions, 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

## 2020-07-30 NOTE — Evaluation (Signed)
Occupational Therapy Evaluation Patient Details Name: [name] [name] MRN: [id number] DOB: [birth year] Today's Date: 07/30/2020    History of Present Illness Pt is a 79 y.o. Caucasian female with a known history of seizure disorder, hypertension, dyslipidemia and stage III chronic kidney disease, GERD and sleep apnea, who presented to the emergency room with acute onset of left arm weakness and numbness that started around 11 PM. MRI negative for acute infarct.   Clinical Impression   Pt was seen for OT evaluation this date. Prior to hospital admission, pt was indep with basic ADL, med mgt, meal prep, and housekeeping, occasionally driving (spouse does most driving). Pt ambulating with rollator in the home, [facility] for short community distances, otherwise using rollator. Pt lives with her spouse in a 2 story home (their bedroom on the main floor). Currently pt demonstrates impairments in balance, sensation to L thumb, and LUE strength as described below (See OT problem list) which functionally limit her ability to perform ADL/self-care tasks, requiring supervision to [place] for ADL transfers and LB ADL tasks. Near baseline, but would benefit from skilled OT services while hospitalized to address noted impairments and functional limitations (see below for any additional details) in order to maximize safety and independence while minimizing falls risk and caregiver burden. Upon hospital discharge, do not anticipate need for skilled OT services.      Follow Up Recommendations  No OT follow up    Equipment Recommendations  None recommended by OT    Recommendations for Other Services       Precautions / Restrictions Precautions Precautions: Fall Restrictions Weight Bearing Restrictions: No      Mobility Bed Mobility Overal bed mobility: Needs Assistance Bed Mobility: Supine to Sit;Sit to Supine     Supine to sit: Supervision Sit to supine: Supervision        Transfers Overall transfer  level: Needs assistance Equipment used: Straight cane Transfers: Sit to/from Stand Sit to Stand: Supervision;Min guard              Balance Overall balance assessment: Mild deficits observed, not formally tested                                         ADL either performed or assessed with clinical judgement   ADL Overall ADL's : Needs assistance/impaired                                       General ADL Comments: Pt at supervision to CGA level for LB ADL, toileting; near baseline     Vision Baseline Vision/History: Wears glasses (mostly contacts) Wears Glasses: At all times Patient Visual Report: No change from baseline       Perception     Praxis      Pertinent Vitals/Pain Pain Assessment: No/denies pain     Hand Dominance Right   Extremity/Trunk Assessment Upper Extremity Assessment Upper Extremity Assessment: LUE deficits/detail (RUE WFL) LUE Deficits / Details: grossly 4+/5, pt endorses thumb still "doesn't feel right" but improved from before; [place] and [facility] WFL LUE Sensation: decreased light touch   Lower Extremity Assessment Lower Extremity Assessment: Overall WFL for tasks assessed;Defer to PT evaluation   Cervical / Trunk Assessment Cervical / Trunk Assessment: Kyphotic   Communication Communication Communication: HOH (very slightly HOH)   Cognition  Arousal/Alertness: Awake/alert Behavior During Therapy: WFL for tasks assessed/performed Overall Cognitive Status: Within Functional Limits for tasks assessed                                     General Comments       Exercises Other Exercises Other Exercises: Pt ambulated in room from bed to toilet with SPC in L hand and handheld assist with R hand, managed clothing and hygiene without assist Other Exercises: pt instructed in LUE ex and activities to support return to PLOF Other Exercises: pt managed morning medications from RN with no significant  difficulty   Shoulder Instructions      Home Living Family/patient expects to be discharged to:: Private residence Living Arrangements: Spouse/significant other Available Help at Discharge: Family;Available 24 hours/day Type of Home: House Home Access: Stairs to enter [employer] of Steps: 5-6 Entrance Stairs-Rails: Left Home Layout: Two level;Able to live on main level with bedroom/bathroom     Bathroom Shower/Tub: Occupational psychologist: Standard     Home Equipment: Shower seat - built in;Grab bars - tub/shower;Cane - single point;Other (comment)   Additional Comments: has rollator      Prior Functioning/Environment Level of Independence: Independent with assistive device(s)        Comments: Pt indep with rollator in the home and for dr appts, uses [facility] for quick trips otherwise, indep with ADL, 1 fall in 10/2019        OT Problem List: Impaired sensation;Impaired balance (sitting and/or standing);Decreased strength      OT Treatment/Interventions: Self-care/ADL training;Balance training;DME and/or AE instruction;Therapeutic exercise;Neuromuscular education;Therapeutic activities;Patient/family education    OT Goals(Current goals can be found in the care plan section) Acute Rehab OT Goals Patient Stated Goal: go home OT Goal Formulation: With patient Time For Goal Achievement: 08/13/20 Potential to Achieve Goals: Good  OT Frequency: Min 1X/week   Barriers to D/C:            Co-evaluation              AM-PAC OT "6 Clicks" Daily Activity     Outcome Measure Help from another person eating meals?: None Help from another person taking care of personal grooming?: None Help from another person toileting, which includes using toliet, bedpan, or urinal?: A Little Help from another person bathing (including washing, rinsing, drying)?: A Little Help from another person to put on and taking off regular upper body clothing?: None Help from  another person to put on and taking off regular lower body clothing?: None 6 Click Score: 22   End of Session Equipment Utilized During Treatment: Gait belt Nurse Communication: Mobility status  Activity Tolerance: Patient tolerated treatment well Patient left: in bed;with call bell/phone within reach  OT Visit Diagnosis: Other abnormalities of gait and mobility (R26.89);Hemiplegia and hemiparesis Hemiplegia - Right/Left: Left Hemiplegia - dominant/non-dominant: Non-Dominant Hemiplegia - caused by: Unspecified                Time: 1540-0867 OT Time Calculation (min): 40 min Charges:  OT General Charges $OT Visit: 1 Visit OT Evaluation $OT Eval Moderate Complexity: 1 Mod OT Treatments $Self Care/Home Management : 8-22 mins $Neuromuscular Re-education: 8-22 mins  Jeni Salles, MPH, MS, OTR/L ascom [phone] 07/30/20, 10:10 AM

## 2020-07-31 DIAGNOSIS — R200 Anesthesia of skin: Secondary | ICD-10-CM | POA: Diagnosis not present

## 2020-07-31 DIAGNOSIS — G459 Transient cerebral ischemic attack, unspecified: Secondary | ICD-10-CM | POA: Diagnosis not present

## 2020-07-31 LAB — GLUCOSE, CAPILLARY
Glucose-Capillary: 107 mg/dL — ABNORMAL HIGH (ref 70–99)
Glucose-Capillary: 120 mg/dL — ABNORMAL HIGH (ref 70–99)

## 2020-07-31 LAB — MAGNESIUM: Magnesium: 2.3 mg/dL (ref 1.7–2.4)

## 2020-07-31 LAB — POTASSIUM: Potassium: 4.4 mmol/L (ref 3.5–5.1)

## 2020-07-31 MED ORDER — POTASSIUM CHLORIDE CRYS ER 20 MEQ PO TBCR
20.0000 meq | EXTENDED_RELEASE_TABLET | Freq: Every day | ORAL | 1 refills | Status: DC
Start: 2020-07-31 — End: 2023-06-08

## 2020-07-31 MED ORDER — MAGNESIUM OXIDE 400 MG PO CAPS: 400.0000 mg | ORAL_CAPSULE | Freq: Every day | ORAL | 1 refills | Status: AC

## 2020-07-31 MED ORDER — CLOPIDOGREL BISULFATE 75 MG PO TABS: 75.0000 mg | ORAL_TABLET | Freq: Every day | ORAL | 1 refills | Status: AC

## 2020-07-31 MED ORDER — METFORMIN HCL ER 500 MG PO TB24: 500.0000 mg | ORAL_TABLET | Freq: Every day | ORAL | 11 refills | Status: AC

## 2020-07-31 NOTE — Discharge Summary (Signed)
Physician Discharge Summary  [name] [name] MVE:[id number] DOB: 1941-03-07 DOA: 07/29/2020  PCP: Kirk Ruths, MD  Admit date: 07/29/2020 Discharge date: 07/31/2020  Admitted From: Home Disposition:  Home  Discharge Condition:Stable CODE STATUS:FULL Diet recommendation: Heart Healthy   Brief/Interim Summary:  Patient is a 79 year old female with history of seizure disorder, hypertension, and hyperlipidemia, stage IIIa CKD, GERD, sleep apnea who presents to the emergency department with complaints of acute onset of left arm weakness/numbness.  On presentation she was hypertensive.  CT head did not show any acute intracranial normalities.  MRI of the brain did not show any acute intracranial normalities.  Carotid Doppler showed minimal-moderate left-sided atherosclerotic plaque with a stenosis of 50 to 69% which has been unchanged since 2018. Neurology consulted.  Stroke work-up initiated and completed.  Currently she does not have any focal neurological deficits.  PT/OT did not recommended home health.  Echocardiogram showed ejection fraction of 55 to 60%, no intracardiac source of emboli. Neurology also requested MRI cervical spine which showed moderate multilevel cervical spondylosis, multilevel foraminal narrowing. Lab work showed severe hypomagnesemia and hypokalemia which have been supplemented. Patient seen and examined the bedside this morning.  She is hemodynamically stable.  Neurology recommended to start on  Plavix and stop aspirin.  Will recommend outpatient follow-up with neurosurgery for her cervical MRI findings. Her LDL is 51.  A1c of 6.7.  She may need to be on Metformin on discharge.  Currently she is not taking  any medications at home.  We started her on Metformin 500 mg daily. We recommend to follow-up with her primary care physician in a week.Marland Kitchen   Discharge Diagnoses:  Active Problems:   Left arm numbness    Discharge Instructions  Discharge Instructions     Diet - low sodium heart healthy   Complete by: As directed    Discharge instructions   Complete by: As directed    1)Take prescribed medications as instructed. 2)Follow up with your PCP in a week.  Do a BMP and magnesium test during the follow-up 3)Follow up with home health 4)Follow up with neurosurgery as an outpatient.   Increase activity slowly   Complete by: As directed      Allergies as of 07/31/2020      Reactions   Contrast Media [iodinated Diagnostic Agents] Other (See Comments)   Made hands and feet "draw up"   Tramadol Other (See Comments)   Other Reaction: OTHER REACTION, SOB, AGITATIO   Adhesive [tape] Rash   Some bandaids   Penicillins Rash, Other (See Comments)   Has patient had a PCN reaction causing immediate rash, facial/tongue/throat swelling, SOB or lightheadedness with hypotension: Yes Has patient had a PCN reaction causing severe rash involving mucus membranes or skin necrosis: No Has patient had a PCN reaction that required hospitalization No Has patient had a PCN reaction occurring within the last 10 years: No If all of the above answers are "NO", then may proceed with Cephalosporin use.      Medication List    STOP taking these medications   aspirin EC 81 MG tablet   doxycycline 100 MG tablet Commonly known as: VIBRA-TABS     TAKE these medications   acetaminophen 500 MG tablet Commonly known as: TYLENOL Take 500 mg by mouth every 8 (eight) hours as needed for mild pain.   atorvastatin 80 MG tablet Commonly known as: LIPITOR Take 80 mg by mouth daily.   carvedilol 12.5 MG tablet Commonly known as: COREG Take  12.5 mg by mouth 2 (two) times daily with a meal.   clopidogrel 75 MG tablet Commonly known as: PLAVIX Take 1 tablet (75 mg total) by mouth daily. Start taking on: August 01, 2020   divalproex 125 MG DR tablet Commonly known as: DEPAKOTE Take 125 mg by mouth 2 (two) times daily.   fluticasone 50 MCG/ACT nasal spray Commonly  known as: FLONASE Place 1 spray into both nostrils daily as needed for allergies or rhinitis.   furosemide 20 MG tablet Commonly known as: LASIX Take 20 mg by mouth daily.   gabapentin 300 MG capsule Commonly known as: NEURONTIN Take 600 mg by mouth 2 (two) times daily.   HYDROcodone-acetaminophen 5-325 MG tablet Commonly known as: NORCO/VICODIN Take 1 tablet by mouth 4 (four) times daily as needed.   levETIRAcetam 500 MG tablet Commonly known as: KEPPRA Take 500 mg by mouth 2 (two) times daily.   Magnesium Oxide 400 MG Caps Take 1 capsule (400 mg total) by mouth daily.   metFORMIN 500 MG 24 hr tablet Commonly known as: Glucophage XR Take 1 tablet (500 mg total) by mouth daily with breakfast.   omeprazole 40 MG capsule Commonly known as: PRILOSEC Take 40 mg by mouth in the morning and at bedtime.   potassium chloride SA 20 MEQ tablet Commonly known as: KLOR-CON Take 1 tablet (20 mEq total) by mouth daily. What changed:   medication strength  how much to take   senna 8.6 MG Tabs tablet Commonly known as: SENOKOT Take 1 tablet by mouth in the morning and at bedtime.   spironolactone 50 MG tablet Commonly known as: ALDACTONE Take 50 mg by mouth daily.   traZODone 50 MG tablet Commonly known as: DESYREL Take 25-50 mg by mouth at bedtime.       Follow-up Information    Kirk Ruths, MD. Schedule an appointment as soon as possible for a visit in 1 week.   Specialty: Internal Medicine Why: Office closed patient to make own follow up appt Contact information: [place] [id number] [phone]              Allergies  Allergen Reactions  . Contrast Media [Iodinated Diagnostic Agents] Other (See Comments)    Made hands and feet "draw up"  . Tramadol Other (See Comments)    Other Reaction: OTHER REACTION, SOB, AGITATIO  . Adhesive [Tape] Rash    Some bandaids  . Penicillins Rash and Other (See  Comments)    Has patient had a PCN reaction causing immediate rash, facial/tongue/throat swelling, SOB or lightheadedness with hypotension: Yes Has patient had a PCN reaction causing severe rash involving mucus membranes or skin necrosis: No Has patient had a PCN reaction that required hospitalization No Has patient had a PCN reaction occurring within the last 10 years: No If all of the above answers are "NO", then may proceed with Cephalosporin use.     Consultations:  Neurology   Procedures/Studies: MR BRAIN WO CONTRAST  Result Date: 07/30/2020 CLINICAL DATA:  Initial evaluation for acute neuro deficit, stroke suspected. EXAM: MRI HEAD WITHOUT CONTRAST TECHNIQUE: Multiplanar, multiecho pulse sequences of the brain and surrounding structures were obtained without intravenous contrast. COMPARISON:  Prior CT from 07/29/2020 as well as previous MRI from 08/23/2016. FINDINGS: Brain: Cerebral volume stable, and remains within normal limits for age. Scattered patchy predominantly subcentimeter T2/FLAIR hyperintensity again seen involving the periventricular, deep, and subcortical white matter both cerebral hemispheres, nonspecific,  but overall mildly progressed as compared to 2018. Appearance is mild to moderate for age. No abnormal foci of restricted diffusion to suggest acute or subacute ischemia. Gray-white matter differentiation maintained. No encephalomalacia to suggest chronic cortical infarction. No foci of susceptibility artifact to suggest acute or chronic intracranial hemorrhage. No mass lesion, midline shift or mass effect. No hydrocephalus or extra-axial fluid collection. No made of a partially empty sella. Midline structures intact. Vascular: Major intracranial vascular flow voids are maintained. Hypoplastic right vertebral artery noted. Skull and upper cervical spine: Degenerative thickening at the tectorial membrane without significant stenosis. Craniocervical junction otherwise  unremarkable. Bone marrow signal intensity within normal limits. No scalp soft tissue abnormality. Sinuses/Orbits: Globes and orbital soft tissues within normal limits. Mild scattered mucosal thickening noted within the ethmoidal air cells. Paranasal sinuses are otherwise clear. No mastoid effusion. Inner ear structures grossly normal. Other: None. IMPRESSION: 1. No acute intracranial abnormality. 2. Scattered T2/FLAIR hyperintensity involving the periventricular, deep, and subcortical white matter of both cerebral hemispheres, overall mildly progressed as compared to most recent available MRI from 2018. Findings are nonspecific, but most commonly related to chronic microvascular ischemic disease. Differential considerations include sequelae of complicated migraines, vasculitis, prior infectious or inflammatory process, or possibly demyelination. Electronically Signed   By: Jeannine Boga M.D.   On: 07/30/2020 02:21   MR CERVICAL SPINE WO CONTRAST  Result Date: 07/30/2020 CLINICAL DATA:  Initial evaluation for cervical radiculopathy, left upper extremity weakness and numbness. EXAM: MRI CERVICAL SPINE WITHOUT CONTRAST TECHNIQUE: Multiplanar, multisequence MR imaging of the cervical spine was performed. No intravenous contrast was administered. COMPARISON:  Previous MRI from 05/23/2014. FINDINGS: Alignment: Straightening with mild reversal of the normal cervical lordosis. 3 mm anterolisthesis of C4 on C5. 3 mm retrolisthesis of C6 on C7, with trace stepwise anterolisthesis of C7 on T1, T1 on T2, and T2 on T3. Findings chronic and facet mediated. Vertebrae: Vertebral body height maintained without acute or chronic fracture. Bone marrow signal intensity within normal limits. No discrete or worrisome osseous lesions. No abnormal marrow edema. Cord: Signal intensity within the cervical spinal cord is within normal limits. Posterior Fossa, vertebral arteries, paraspinal tissues: Visualized brain and posterior  fossa within normal limits. Craniocervical junction normal. Paraspinous and prevertebral soft tissues within normal limits. Normal flow voids seen within the vertebral arteries bilaterally. Strongly dominant left vertebral artery noted. Disc levels: C2-C3: Minimal disc bulge. Mild bilateral facet hypertrophy. No canal or foraminal stenosis. C3-C4: Diffuse disc bulge with bilateral uncovertebral hypertrophy. Moderate left worse than right facet and ligament flavum hypertrophy. Resultant mild spinal stenosis without significant cord deformity. Moderate to severe left with moderate right C4 foraminal stenosis, progressed from previous. C4-C5: Anterolisthesis. Mild diffuse disc bulge with disc desiccation. Broad posterior disc osteophyte flattens and partially effaces the ventral thecal sac. Superimposed moderate left worse than right facet hypertrophy. Resultant mild spinal stenosis without cord impingement. Moderate bilateral C5 foraminal stenosis. C5-C6: Degenerative intervertebral disc space narrowing with diffuse disc osteophyte complex. Flattening and partial effacement of the ventral thecal sac, slightly greater on the left. Mild spinal stenosis with mild flattening of the ventral cord, greater on the left. No cord signal changes. No more than mild bilateral C6 foraminal narrowing. Appearance is similar. C6-C7: Degenerative intervertebral disc space narrowing with diffuse disc osteophyte complex. Broad posterior component flattens and effaces the ventral thecal sac. Resultant mild spinal stenosis with minimal cord flattening. No cord signal changes. Moderate left with mild right C7 foraminal stenosis. Appearance is similar to  perhaps mildly progressed from previous. C7-T1: Anterolisthesis. Mild disc bulge with uncovertebral hypertrophy. Bilateral facet degeneration. No spinal stenosis. Foramina remain patent. Visualized upper thoracic spine demonstrates multilevel noncompressive disc bulging without significant  stenosis. IMPRESSION: 1. Moderate multilevel cervical spondylosis with resultant mild diffuse spinal stenosis at C3-4 through C6-7. Overall, appearance is mildly progressed as compared to 2015. 2. Multifactorial degenerative changes with resultant multilevel foraminal narrowing as above. Notable findings include moderate to severe left with moderate right C4 foraminal stenosis, moderate bilateral C5 foraminal narrowing, with moderate left C7 foraminal stenosis. Electronically Signed   By: Jeannine Boga M.D.   On: 07/30/2020 21:33   US Carotid Bilateral (at [facility] and [facility] only)  Result Date: 07/30/2020 CLINICAL DATA:  TIA. History of hypertension, hyperlipidemia and diabetes. EXAM: BILATERAL CAROTID DUPLEX ULTRASOUND TECHNIQUE: Pearline Cables scale imaging, color Doppler and duplex ultrasound were performed of bilateral carotid and vertebral arteries in the neck. COMPARISON:  08/23/2016 FINDINGS: Criteria: Quantification of carotid stenosis is based on velocity parameters that correlate the residual internal carotid diameter with NASCET-based stenosis levels, using the diameter of the distal internal carotid lumen as the denominator for stenosis measurement. The following velocity measurements were obtained: RIGHT ICA: 107/36 cm/sec CCA: 26/71 cm/sec SYSTOLIC ICA/CCA RATIO:  1.6 ECA: 675 cm/sec LEFT ICA: 161/34 cm/sec CCA: 24/58 cm/sec SYSTOLIC ICA/CCA RATIO:  2.3 ECA: 62 cm/sec RIGHT CAROTID ARTERY: There is a minimal amount of eccentric echogenic plaque within the right carotid bulb (image 7 and 18), extending to involve the origin and proximal aspects of the right internal carotid artery (image 23), grossly unchanged compared to the 08/2016 examination and again not resulting in elevated peak systolic velocities within the interrogated course the right internal carotid artery to suggest a hemodynamically significant stenosis. RIGHT VERTEBRAL ARTERY:  Antegrade flow LEFT CAROTID ARTERY: There is a minimal moderate  amount of eccentric echogenic plaque within the left carotid bulb (image 36 and 49), extending to involve the origin and proximal aspects of the left internal carotid artery (image 55), grossly unchanged compared to the 2018 examination and again resulting in elevated peak systolic velocities within the distal aspect of the left internal carotid artery. Greatest acquired peak systolic velocity within the distal left ICA measures 161 centimeters/second (image 63), previously, greatest acquired peak systolic velocity with the proximal left ICA measured 135 centimeters/second. LEFT VERTEBRAL ARTERY:  Antegrade flow IMPRESSION: 1. Minimal moderate amount of left-sided atherosclerotic plaque, morphologically similar to the 2018 examination and again results in elevated peak systolic velocities within the left internal carotid artery compatible with the 50-69% luminal narrowing range. Further evaluation with CTA could be performed as clinically indicated. 2. Minimal amount of right-sided atherosclerotic plaque, not resulting in a hemodynamically significant stenosis. Electronically Signed   By: Sandi Mariscal M.D.   On: 07/30/2020 07:49   MM 3D SCREEN BREAST BILATERAL  Result Date: 07/27/2020 CLINICAL DATA:  Screening. EXAM: DIGITAL SCREENING BILATERAL MAMMOGRAM WITH TOMO AND CAD COMPARISON:  Previous exam(s). ACR Breast Density Category b: There are scattered areas of fibroglandular density. FINDINGS: There are no findings suspicious for malignancy. Images were processed with CAD. IMPRESSION: No mammographic evidence of malignancy. A result letter of this screening mammogram will be mailed directly to the patient. RECOMMENDATION: Screening mammogram in one year. (Code:SM-B-01Y) BI-RADS CATEGORY  1: Negative. Electronically Signed   By: Ammie Ferrier M.D.   On: 07/27/2020 11:09   ECHOCARDIOGRAM COMPLETE  Result Date: 07/30/2020    ECHOCARDIOGRAM REPORT   Patient Name:   [name] [name]  Inda Merlin Date of Exam: 07/30/2020  Medical Rec #:  [id number]        Height:       59.0 in Accession #:    [id number]       Weight:       133.0 lb Date of Birth:  [birth year]        BSA:          1.551 m Patient Age:    32 years         BP:           1456/64 mmHg Patient Gender: F                HR:           63 bpm. Exam Location:  [facility] Procedure: 2D Echo, Color Doppler and Cardiac Doppler Indications:     TIA 435.9  History:         Patient has prior history of Echocardiogram examinations, most                  recent 12/18/2018. Risk Factors:Hypertension, Diabetes and                  Dyslipidemia.  Sonographer:     Sherrie Sport RDCS (AE) Referring Phys:  [id number] Arvella Merles MANSY Diagnosing Phys: Kathlyn Sacramento MD IMPRESSIONS  1. Left ventricular ejection fraction, by estimation, is 55 to 60%. The left ventricle has normal function. The left ventricle has no regional wall motion abnormalities. There is mild left ventricular hypertrophy. Left ventricular diastolic parameters are indeterminate.  2. Right ventricular systolic function is normal. The right ventricular size is normal.  3. The mitral valve is normal in structure. No evidence of mitral valve regurgitation. No evidence of mitral stenosis.  4. The aortic valve is normal in structure. Aortic valve regurgitation is not visualized. No aortic stenosis is present.  5. The inferior vena cava is dilated in size with >50% respiratory variability, suggesting right atrial pressure of 8 mmHg. Conclusion(s)/Recommendation(s): No intracardiac source of embolism detected on this transthoracic study. FINDINGS  Left Ventricle: Left ventricular ejection fraction, by estimation, is 55 to 60%. The left ventricle has normal function. The left ventricle has no regional wall motion abnormalities. The left ventricular internal cavity size was normal in size. There is  mild left ventricular hypertrophy. Left ventricular diastolic parameters are indeterminate. Right Ventricle: The right ventricular size is normal. No  increase in right ventricular wall thickness. Right ventricular systolic function is normal. Left Atrium: Left atrial size was normal in size. Right Atrium: Right atrial size was normal in size. Pericardium: There is no evidence of pericardial effusion. Mitral Valve: The mitral valve is normal in structure. No evidence of mitral valve regurgitation. No evidence of mitral valve stenosis. Tricuspid Valve: The tricuspid valve is normal in structure. Tricuspid valve regurgitation is not demonstrated. No evidence of tricuspid stenosis. Aortic Valve: The aortic valve is normal in structure. Aortic valve regurgitation is not visualized. No aortic stenosis is present. Aortic valve mean gradient measures 4.0 mmHg. Aortic valve peak gradient measures 7.5 mmHg. Aortic valve area, by VTI measures 2.00 cm. Pulmonic Valve: The pulmonic valve was normal in structure. Pulmonic valve regurgitation is not visualized. No evidence of pulmonic stenosis. Aorta: The aortic root is normal in size and structure. Venous: The inferior vena cava is dilated in size with greater than 50% respiratory variability, suggesting right atrial pressure of 8 mmHg. IAS/Shunts: No atrial level shunt detected by  color flow Doppler.  LEFT VENTRICLE PLAX 2D LVIDd:         3.77 cm  Diastology LVIDs:         2.63 cm  LV e' medial:    6.85 cm/s LV PW:         1.13 cm  LV E/e' medial:  9.6 LV IVS:        1.13 cm  LV e' lateral:   6.09 cm/s LVOT diam:     2.00 cm  LV E/e' lateral: 10.8 LV SV:         63 LV SV Index:   40 LVOT Area:     3.14 cm  RIGHT VENTRICLE RV Basal diam:  2.52 cm RV S prime:     11.60 cm/s TAPSE (M-mode): 3.7 cm LEFT ATRIUM             Index       RIGHT ATRIUM           Index LA diam:        2.90 cm 1.87 cm/m  RA Area:     11.10 cm LA Vol (A2C):   80.8 ml 52.10 ml/m RA Volume:   22.40 ml  14.44 ml/m LA Vol (A4C):   38.1 ml 24.57 ml/m LA Biplane Vol: 61.5 ml 39.66 ml/m  AORTIC VALVE                   PULMONIC VALVE AV Area (Vmax):     1.96 cm    PV Vmax:       0.71 m/s AV Area (Vmean):   1.57 cm    PV Peak grad:  2.0 mmHg AV Area (VTI):     2.00 cm AV Vmax:           137.33 cm/s AV Vmean:          91.833 cm/s AV VTI:            0.312 m AV Peak Grad:      7.5 mmHg AV Mean Grad:      4.0 mmHg LVOT Vmax:         85.70 cm/s LVOT Vmean:        46.000 cm/s LVOT VTI:          0.199 m LVOT/AV VTI ratio: 0.64  AORTA Ao Root diam: 2.30 cm MITRAL VALVE               TRICUSPID VALVE MV Area (PHT): 2.77 cm    TR Peak grad:   12.4 mmHg MV Decel Time: 274 msec    TR Vmax:        176.00 cm/s MV E velocity: 65.50 cm/s MV A velocity: 90.20 cm/s  SHUNTS MV E/A ratio:  0.73        Systemic VTI:  0.20 m                            Systemic Diam: 2.00 cm Kathlyn Sacramento MD Electronically signed by Kathlyn Sacramento MD Signature Date/Time: 07/30/2020/12:21:37 PM    Final    CT HEAD CODE STROKE WO CONTRAST  Result Date: 07/30/2020 CLINICAL DATA:  Code stroke. Initial evaluation for acute stroke, left-sided numbness. EXAM: CT HEAD WITHOUT CONTRAST TECHNIQUE: Contiguous axial images were obtained from the base of the skull through the vertex without intravenous contrast. COMPARISON:  Prior MRI from 08/23/2016. FINDINGS: Brain: Cerebral volume stable, and remains within normal limits  for age. Patchy hypodensity involving the supratentorial cerebral white matter noted, nonspecific, but overall grossly similar and stable from previous. No acute intracranial hemorrhage. No acute large vessel territory infarct. No mass lesion, midline shift or mass effect. No hydrocephalus or extra-axial fluid collection. Vascular: No hyperdense vessel. Skull: Scalp soft tissues and calvarium within normal limits. Sinuses/Orbits: Globes and orbital soft tissues demonstrate no acute finding. Sequelae of prior left orbital floor repair noted. Scattered mucosal thickening noted within the ethmoidal air cells. Paranasal sinuses are otherwise clear. No mastoid effusion. Other: None. ASPECTS  [facility] Stroke Program Early CT Score) - Ganglionic level infarction (caudate, lentiform nuclei, internal capsule, insula, M1-M3 cortex): 7 - Supraganglionic infarction (M4-M6 cortex): 3 Total score (0-10 with 10 being normal): 10 IMPRESSION: 1. No acute intracranial infarct or other abnormality. 2. ASPECTS is 10. 3. Patchy nonspecific supratentorial cerebral white matter disease, nonspecific, but overall grossly similar to previous exams. Critical Value/emergent results were called by telephone at the time of interpretation on 07/29/2020 at 11:58 pm to provider Rudene Re , who verbally acknowledged these results. Electronically Signed   By: Jeannine Boga M.D.   On: 07/30/2020 00:01      Subjective: Patient seen and examined at the bedside this morning.  Hemodynamically stable for discharge today.  Discharge Exam: Vitals:   07/31/20 0745 07/31/20 1112  BP: (!) 147/47 (!) 142/59  Pulse: 69 63  Resp: 16 16  Temp: 98.1 F (36.7 C) 98.3 F (36.8 C)  SpO2: 97% 98%   Vitals:   07/31/20 0002 07/31/20 0408 07/31/20 0745 07/31/20 1112  BP: (!) 137/55 (!) 128/39 (!) 147/47 (!) 142/59  Pulse: 72 (!) 58 69 63  Resp: 17 16 16 16   Temp: 98.9 F (37.2 C) 97.6 F (36.4 C) 98.1 F (36.7 C) 98.3 F (36.8 C)  TempSrc: Oral Oral    SpO2: 96% 97% 97% 98%  Weight:      Height:        General: Pt is alert, awake, not in acute distress Cardiovascular: RRR, S1/S2 +, no rubs, no gallops Respiratory: CTA bilaterally, no wheezing, no rhonchi Abdominal: Soft, NT, ND, bowel sounds + Extremities: no edema, no cyanosis    The results of significant diagnostics from this hospitalization (including imaging, microbiology, ancillary and laboratory) are listed below for reference.     Microbiology: Recent Results (from the past 240 hour(s))  Resp Panel by RT-PCR (Flu A&B, Covid) Nasopharyngeal Swab     Status: None   Collection Time: 07/30/20 12:09 AM   Specimen: Nasopharyngeal Swab;  Nasopharyngeal(NP) swabs in vial transport medium  Result Value Ref Range Status   SARS Coronavirus 2 by RT PCR NEGATIVE NEGATIVE Final    Comment: (NOTE) SARS-CoV-2 target nucleic acids are NOT DETECTED.  The SARS-CoV-2 RNA is generally detectable in upper respiratory specimens during the acute phase of infection. The lowest concentration of SARS-CoV-2 viral copies this assay can detect is 138 copies/mL. A negative result does not preclude SARS-Cov-2 infection and should not be used as the sole basis for treatment or other patient management decisions. A negative result may occur with  improper specimen collection/handling, submission of specimen other than nasopharyngeal swab, presence of viral mutation(s) within the areas targeted by this assay, and inadequate number of viral copies(<138 copies/mL). A negative result must be combined with clinical observations, patient history, and epidemiological information. The expected result is Negative.  Fact Sheet for Patients:  EntrepreneurPulse.com.au  Fact Sheet for Healthcare Providers:  IncredibleEmployment.be  This test  is no t yet approved or cleared by the [place] and  has been authorized for detection and/or diagnosis of SARS-CoV-2 by FDA under an Emergency Use Authorization (EUA). This EUA will remain  in effect (meaning this test can be used) for the duration of the COVID-19 declaration under Section 564(b)(1) of the Act, 21 U.S.C.section 360bbb-3(b)(1), unless the authorization is terminated  or revoked sooner.       Influenza A by PCR NEGATIVE NEGATIVE Final   Influenza B by PCR NEGATIVE NEGATIVE Final    Comment: (NOTE) The Xpert Xpress SARS-CoV-2/FLU/RSV plus assay is intended as an aid in the diagnosis of influenza from Nasopharyngeal swab specimens and should not be used as a sole basis for treatment. Nasal washings and aspirates are unacceptable for Xpert Xpress  SARS-CoV-2/FLU/RSV testing.  Fact Sheet for Patients: EntrepreneurPulse.com.au  Fact Sheet for Healthcare Providers: IncredibleEmployment.be  This test is not yet approved or cleared by the [place] FDA and has been authorized for detection and/or diagnosis of SARS-CoV-2 by FDA under an Emergency Use Authorization (EUA). This EUA will remain in effect (meaning this test can be used) for the duration of the COVID-19 declaration under Section 564(b)(1) of the Act, 21 U.S.C. section 360bbb-3(b)(1), unless the authorization is terminated or revoked.  Performed at [facility], [address] [place], [place] [address]      Labs: BNP (last 3 results) No results for input(s): BNP in the last 8760 hours. Basic Metabolic Panel: Recent Labs  Lab 07/29/20 2339 07/30/20 0917 07/31/20 0444  NA 142  --   --   K 2.9* 3.2* 4.4  CL 103  --   --   CO2 28  --   --   GLUCOSE 119*  --   --   BUN 15  --   --   CREATININE 0.60  --   --   CALCIUM 7.3*  --   --   MG 1.0* 1.0* 2.3   Liver Function Tests: Recent Labs  Lab 07/29/20 2339  AST 25  ALT 29  ALKPHOS 77  BILITOT 0.8  PROT 6.5  ALBUMIN 3.7   No results for input(s): LIPASE, AMYLASE in the last 168 hours. No results for input(s): AMMONIA in the last 168 hours. CBC: Recent Labs  Lab 07/29/20 2339  WBC 8.3  NEUTROABS 5.3  HGB 12.2  HCT 37.2  MCV 92.8  PLT 184   Cardiac Enzymes: No results for input(s): CKTOTAL, CKMB, CKMBINDEX, TROPONINI in the last 168 hours. BNP: Invalid input(s): POCBNP CBG: Recent Labs  Lab 07/30/20 0916 07/30/20 1550 07/30/20 2137 07/31/20 0744 07/31/20 1114  GLUCAP 78 154* 110* 107* 120*   D-Dimer No results for input(s): DDIMER in the last 72 hours. Hgb A1c Recent Labs    07/30/20 0637  HGBA1C 6.7*   Lipid Profile Recent Labs    07/30/20 0637  CHOL 109  HDL 39*  LDLCALC 51  TRIG 96  CHOLHDL 2.8   Thyroid function  studies No results for input(s): TSH, T4TOTAL, T3FREE, THYROIDAB in the last 72 hours.  Invalid input(s): FREET3 Anemia work up No results for input(s): VITAMINB12, FOLATE, FERRITIN, TIBC, IRON, RETICCTPCT in the last 72 hours. Urinalysis    Component Value Date/Time   COLORURINE STRAW (A) 01/12/2020 1059   APPEARANCEUR CLEAR (A) 01/12/2020 1059   LABSPEC 1.005 01/12/2020 1059   PHURINE 6.0 01/12/2020 1059   GLUCOSEU NEGATIVE 01/12/2020 1059   HGBUR NEGATIVE 01/12/2020 1059   BILIRUBINUR NEGATIVE  01/12/2020 [place] 01/12/2020 1059   PROTEINUR NEGATIVE 01/12/2020 1059   NITRITE NEGATIVE 01/12/2020 1059   LEUKOCYTESUR NEGATIVE 01/12/2020 1059   Sepsis Labs Invalid input(s): PROCALCITONIN,  WBC,  LACTICIDVEN Microbiology Recent Results (from the past 240 hour(s))  Resp Panel by RT-PCR (Flu A&B, Covid) Nasopharyngeal Swab     Status: None   Collection Time: 07/30/20 12:09 AM   Specimen: Nasopharyngeal Swab; Nasopharyngeal(NP) swabs in vial transport medium  Result Value Ref Range Status   SARS Coronavirus 2 by RT PCR NEGATIVE NEGATIVE Final    Comment: (NOTE) SARS-CoV-2 target nucleic acids are NOT DETECTED.  The SARS-CoV-2 RNA is generally detectable in upper respiratory specimens during the acute phase of infection. The lowest concentration of SARS-CoV-2 viral copies this assay can detect is 138 copies/mL. A negative result does not preclude SARS-Cov-2 infection and should not be used as the sole basis for treatment or other patient management decisions. A negative result may occur with  improper specimen collection/handling, submission of specimen other than nasopharyngeal swab, presence of viral mutation(s) within the areas targeted by this assay, and inadequate number of viral copies(<138 copies/mL). A negative result must be combined with clinical observations, patient history, and epidemiological information. The expected result is Negative.  Fact  Sheet for Patients:  EntrepreneurPulse.com.au  Fact Sheet for Healthcare Providers:  IncredibleEmployment.be  This test is no t yet approved or cleared by the [place] FDA and  has been authorized for detection and/or diagnosis of SARS-CoV-2 by FDA under an Emergency Use Authorization (EUA). This EUA will remain  in effect (meaning this test can be used) for the duration of the COVID-19 declaration under Section 564(b)(1) of the Act, 21 U.S.C.section 360bbb-3(b)(1), unless the authorization is terminated  or revoked sooner.       Influenza A by PCR NEGATIVE NEGATIVE Final   Influenza B by PCR NEGATIVE NEGATIVE Final    Comment: (NOTE) The Xpert Xpress SARS-CoV-2/FLU/RSV plus assay is intended as an aid in the diagnosis of influenza from Nasopharyngeal swab specimens and should not be used as a sole basis for treatment. Nasal washings and aspirates are unacceptable for Xpert Xpress SARS-CoV-2/FLU/RSV testing.  Fact Sheet for Patients: EntrepreneurPulse.com.au  Fact Sheet for Healthcare Providers: IncredibleEmployment.be  This test is not yet approved or cleared by the [place] FDA and has been authorized for detection and/or diagnosis of SARS-CoV-2 by FDA under an Emergency Use Authorization (EUA). This EUA will remain in effect (meaning this test can be used) for the duration of the COVID-19 declaration under Section 564(b)(1) of the Act, 21 U.S.C. section 360bbb-3(b)(1), unless the authorization is terminated or revoked.  Performed at [facility], [address] [place], [place] [address]     Please note: You were cared for by a hospitalist during your hospital stay. Once you are discharged, your primary care physician will handle any further medical issues. Please note that NO REFILLS for any discharge medications will be authorized once you are discharged, as it is imperative  that you return to your primary care physician (or establish a relationship with a primary care physician if you do not have one) for your post hospital discharge needs so that they can reassess your need for medications and monitor your lab values.    Time coordinating discharge: 40 minutes  SIGNED:   Shelly Coss, MD  [facility] 07/31/2020, 12:39 PM Pager [phone]  If 7PM-7AM, please contact night-coverage [email or web address] Password [email or web address]

## 2020-07-31 NOTE — Plan of Care (Signed)
Pt AAox4, VS stable, No complaints today, Up at baseline ambulating. Plan to discharge home with self care. Discharge instructions reviewed with patient, no questions or concerns. IV removed. Husband to transport patient home.      Problem: Education: Goal: Knowledge of disease or condition will improve Outcome: Adequate for Discharge Goal: Knowledge of secondary prevention will improve Outcome: Adequate for Discharge

## 2020-07-31 NOTE — Progress Notes (Signed)
Physical Therapy Treatment Patient Details Name: [name] [name] MRN: [id number] DOB: 1941/03/28 Today's Date: 07/31/2020    History of Present Illness Pt is a 79 y.o. Caucasian female with a known history of seizure disorder, hypertension, dyslipidemia and stage III chronic kidney disease, GERD and sleep apnea, who presented to the emergency room with acute onset of left arm weakness and numbness that started around 11 PM. MRI negative for acute infarct.    PT Comments    Pt resting in bed upon PT arrival; pt reporting L UE symptoms resolved.  Able to ambulate 2 laps around nursing loop with RW (pt steady and safe with step through gait pattern during ambulation).  No reports of pain.  No PT needs anticipated upon hospital discharge.  Will continue to focus on strengthening and independence with functional mobility during hospitalization.   Follow Up Recommendations  No PT follow up     Equipment Recommendations  None recommended by PT (rollator appears appropriate for pt use)    Recommendations for Other Services       Precautions / Restrictions Precautions Precautions: Fall Restrictions Weight Bearing Restrictions: No    Mobility  Bed Mobility Overal bed mobility: Modified Independent Bed Mobility: Supine to Sit;Sit to Supine     Supine to sit: Modified independent (Device/Increase time);HOB elevated Sit to supine: Modified independent (Device/Increase time);HOB elevated   General bed mobility comments: no difficulties noted  Transfers Overall transfer level: Needs assistance Equipment used: None Transfers: Sit to/from [facility] Sit to Stand: Supervision Stand pivot transfers: Supervision       General transfer comment: steady safe transfers (no UE support) from bed x1 trial and from toilet x1 trial  Ambulation/Gait Ambulation/Gait assistance: Min guard;Supervision Gait Distance (Feet): 350 Feet Assistive device: Rolling walker (2  wheeled) Gait Pattern/deviations: WFL(Within Functional Limits) Gait velocity: decreased   General Gait Details: steady with RW use   Stairs             Wheelchair Mobility    Modified Rankin (Stroke Patients Only)       Balance Overall balance assessment: Needs assistance Sitting-balance support: No upper extremity supported;Feet supported Sitting balance-Leahy Scale: Normal Sitting balance - Comments: steady sitting reaching outside BOS   Standing balance support: No upper extremity supported Standing balance-Leahy Scale: Good Standing balance comment: steady washing hands at sink                            Cognition Arousal/Alertness: Awake/alert Behavior During Therapy: WFL for tasks assessed/performed Overall Cognitive Status: Within Functional Limits for tasks assessed                                        Exercises      General Comments   Nursing cleared pt for participation in physical therapy.  Pt agreeable to PT session.      Pertinent Vitals/Pain Pain Assessment: No/denies pain  Vitals (HR and O2 on room air) stable and WFL throughout treatment session.    Home Living                      Prior Function            PT Goals (current goals can now be found in the care plan section) Acute Rehab PT Goals Patient Stated Goal: go home PT  Goal Formulation: With patient Time For Goal Achievement: 08/13/20 Potential to Achieve Goals: Good Progress towards PT goals: Progressing toward goals    Frequency    Min 2X/week      PT Plan Current plan remains appropriate    Co-evaluation              AM-PAC PT "6 Clicks" Mobility   Outcome Measure  Help needed turning from your back to your side while in a flat bed without using bedrails?: None Help needed moving from lying on your back to sitting on the side of a flat bed without using bedrails?: None Help needed moving to and from a bed to a chair  (including a wheelchair)?: None Help needed standing up from a chair using your arms (e.g., wheelchair or bedside chair)?: None Help needed to walk in hospital room?: None Help needed climbing 3-5 steps with a railing? : A Little 6 Click Score: 23    End of Session Equipment Utilized During Treatment: Gait belt Activity Tolerance: Patient tolerated treatment well Patient left: in bed;with call bell/phone within reach;with bed alarm set Nurse Communication: Mobility status;Precautions PT Visit Diagnosis: Other abnormalities of gait and mobility (R26.89)     Time: 1120-1150 PT Time Calculation (min) (ACUTE ONLY): 30 min  Charges:  $Therapeutic Activity: 23-37 mins                    Leitha Bleak, PT 07/31/20, 12:33 PM

## 2020-07-31 NOTE — Progress Notes (Signed)
Subjective: Patient doing well today.  No further LUE symptoms.    Objective: Current vital signs: BP (!) 142/59 (BP Location: Left Arm)   Pulse 63   Temp 98.3 F (36.8 C)   Resp 16   Ht 4\' 11"  (1.499 m)   Wt 60.3 kg   SpO2 98%   BMI 26.86 kg/m  Vital signs in last 24 hours: Temp:  [97.6 F (36.4 C)-98.9 F (37.2 C)] 98.3 F (36.8 C) (12/21 1112) Pulse Rate:  [58-72] 63 (12/21 1112) Resp:  [16-17] 16 (12/21 1112) BP: (128-147)/(39-60) 142/59 (12/21 1112) SpO2:  [96 %-100 %] 98 % (12/21 1112)  Intake/Output from previous day: 12/20 0701 - 12/21 0700 In: 762.1 [I.V.:708.7; IV Piggyback:53.3] Out: -  Intake/Output this shift: No intake/output data recorded. Nutritional status:  Diet Order            Diet Carb Modified Fluid consistency: Thin; Room service appropriate? Yes  Diet effective now                 Neurologic Exam: Mental Status: Alert, oriented, thought content appropriate.  Speech fluent without evidence of aphasia.  Able to follow 3 step commands without difficulty. Cranial Nerves: II: Visual fields grossly normal, pupils equal, round, reactive to light and accommodation III,IV, VI: ptosis not present, extra-ocular motions intact bilaterally V,VII: smile symmetric, facial light touch sensation normal bilaterally VIII: hearing normal bilaterally IX,X: gag reflex present XI: bilateral shoulder shrug XII: midline tongue extension Motor: 5/5 throughout Sensory: Pinprick and light touch intact throughout, bilaterally   Lab Results: Basic Metabolic Panel: Recent Labs  Lab 07/29/20 2339 07/30/20 0917 07/31/20 0444  NA 142  --   --   K 2.9* 3.2* 4.4  CL 103  --   --   CO2 28  --   --   GLUCOSE 119*  --   --   BUN 15  --   --   CREATININE 0.60  --   --   CALCIUM 7.3*  --   --   MG 1.0* 1.0* 2.3    Liver Function Tests: Recent Labs  Lab 07/29/20 2339  AST 25  ALT 29  ALKPHOS 77  BILITOT 0.8  PROT 6.5  ALBUMIN 3.7   No results for  input(s): LIPASE, AMYLASE in the last 168 hours. No results for input(s): AMMONIA in the last 168 hours.  CBC: Recent Labs  Lab 07/29/20 2339  WBC 8.3  NEUTROABS 5.3  HGB 12.2  HCT 37.2  MCV 92.8  PLT 184    Cardiac Enzymes: No results for input(s): CKTOTAL, CKMB, CKMBINDEX, TROPONINI in the last 168 hours.  Lipid Panel: Recent Labs  Lab 07/30/20 0637  CHOL 109  TRIG 96  HDL 39*  CHOLHDL 2.8  VLDL 19  LDLCALC 51    CBG: Recent Labs  Lab 07/30/20 0916 07/30/20 1550 07/30/20 2137 07/31/20 0744 07/31/20 1114  GLUCAP 78 154* 110* 107* 120*    Microbiology: Results for orders placed or performed during the hospital encounter of 07/29/20  Resp Panel by RT-PCR (Flu A&B, Covid) Nasopharyngeal Swab     Status: None   Collection Time: 07/30/20 12:09 AM   Specimen: Nasopharyngeal Swab; Nasopharyngeal(NP) swabs in vial transport medium  Result Value Ref Range Status   SARS Coronavirus 2 by RT PCR NEGATIVE NEGATIVE Final    Comment: (NOTE) SARS-CoV-2 target nucleic acids are NOT DETECTED.  The SARS-CoV-2 RNA is generally detectable in upper respiratory specimens during the acute phase of  infection. The lowest concentration of SARS-CoV-2 viral copies this assay can detect is 138 copies/mL. A negative result does not preclude SARS-Cov-2 infection and should not be used as the sole basis for treatment or other patient management decisions. A negative result may occur with  improper specimen collection/handling, submission of specimen other than nasopharyngeal swab, presence of viral mutation(s) within the areas targeted by this assay, and inadequate number of viral copies(<138 copies/mL). A negative result must be combined with clinical observations, patient history, and epidemiological information. The expected result is Negative.  Fact Sheet for Patients:  EntrepreneurPulse.com.au  Fact Sheet for Healthcare Providers:   IncredibleEmployment.be  This test is no t yet approved or cleared by the [place] FDA and  has been authorized for detection and/or diagnosis of SARS-CoV-2 by FDA under an Emergency Use Authorization (EUA). This EUA will remain  in effect (meaning this test can be used) for the duration of the COVID-19 declaration under Section 564(b)(1) of the Act, 21 U.S.C.section 360bbb-3(b)(1), unless the authorization is terminated  or revoked sooner.       Influenza A by PCR NEGATIVE NEGATIVE Final   Influenza B by PCR NEGATIVE NEGATIVE Final    Comment: (NOTE) The Xpert Xpress SARS-CoV-2/FLU/RSV plus assay is intended as an aid in the diagnosis of influenza from Nasopharyngeal swab specimens and should not be used as a sole basis for treatment. Nasal washings and aspirates are unacceptable for Xpert Xpress SARS-CoV-2/FLU/RSV testing.  Fact Sheet for Patients: EntrepreneurPulse.com.au  Fact Sheet for Healthcare Providers: IncredibleEmployment.be  This test is not yet approved or cleared by the [place] FDA and has been authorized for detection and/or diagnosis of SARS-CoV-2 by FDA under an Emergency Use Authorization (EUA). This EUA will remain in effect (meaning this test can be used) for the duration of the COVID-19 declaration under Section 564(b)(1) of the Act, 21 U.S.C. section 360bbb-3(b)(1), unless the authorization is terminated or revoked.  Performed at [facility], [address] [place],  [address]     Coagulation Studies: Recent Labs    07/29/20 2337/11/23  LABPROT 12.4  INR 1.0    Imaging: MR BRAIN WO CONTRAST  Result Date: 07/30/2020 CLINICAL DATA:  Initial evaluation for acute neuro deficit, stroke suspected. EXAM: MRI HEAD WITHOUT CONTRAST TECHNIQUE: Multiplanar, multiecho pulse sequences of the brain and surrounding structures were obtained without intravenous contrast. COMPARISON:   Prior CT from 07/29/2020 as well as previous MRI from 08/23/2016. FINDINGS: Brain: Cerebral volume stable, and remains within normal limits for age. Scattered patchy predominantly subcentimeter T2/FLAIR hyperintensity again seen involving the periventricular, deep, and subcortical white matter both cerebral hemispheres, nonspecific, but overall mildly progressed as compared to 11/23/2016. Appearance is mild to moderate for age. No abnormal foci of restricted diffusion to suggest acute or subacute ischemia. Gray-white matter differentiation maintained. No encephalomalacia to suggest chronic cortical infarction. No foci of susceptibility artifact to suggest acute or chronic intracranial hemorrhage. No mass lesion, midline shift or mass effect. No hydrocephalus or extra-axial fluid collection. No made of a partially empty sella. Midline structures intact. Vascular: Major intracranial vascular flow voids are maintained. Hypoplastic right vertebral artery noted. Skull and upper cervical spine: Degenerative thickening at the tectorial membrane without significant stenosis. Craniocervical junction otherwise unremarkable. Bone marrow signal intensity within normal limits. No scalp soft tissue abnormality. Sinuses/Orbits: Globes and orbital soft tissues within normal limits. Mild scattered mucosal thickening noted within the ethmoidal air cells. Paranasal sinuses are otherwise clear. No mastoid effusion. Inner ear structures  grossly normal. Other: None. IMPRESSION: 1. No acute intracranial abnormality. 2. Scattered T2/FLAIR hyperintensity involving the periventricular, deep, and subcortical white matter of both cerebral hemispheres, overall mildly progressed as compared to most recent available MRI from 2018. Findings are nonspecific, but most commonly related to chronic microvascular ischemic disease. Differential considerations include sequelae of complicated migraines, vasculitis, prior infectious or inflammatory process, or  possibly demyelination. Electronically Signed   By: Jeannine Boga M.D.   On: 07/30/2020 02:21   MR CERVICAL SPINE WO CONTRAST  Result Date: 07/30/2020 CLINICAL DATA:  Initial evaluation for cervical radiculopathy, left upper extremity weakness and numbness. EXAM: MRI CERVICAL SPINE WITHOUT CONTRAST TECHNIQUE: Multiplanar, multisequence MR imaging of the cervical spine was performed. No intravenous contrast was administered. COMPARISON:  Previous MRI from 05/23/2014. FINDINGS: Alignment: Straightening with mild reversal of the normal cervical lordosis. 3 mm anterolisthesis of C4 on C5. 3 mm retrolisthesis of C6 on C7, with trace stepwise anterolisthesis of C7 on T1, T1 on T2, and T2 on T3. Findings chronic and facet mediated. Vertebrae: Vertebral body height maintained without acute or chronic fracture. Bone marrow signal intensity within normal limits. No discrete or worrisome osseous lesions. No abnormal marrow edema. Cord: Signal intensity within the cervical spinal cord is within normal limits. Posterior Fossa, vertebral arteries, paraspinal tissues: Visualized brain and posterior fossa within normal limits. Craniocervical junction normal. Paraspinous and prevertebral soft tissues within normal limits. Normal flow voids seen within the vertebral arteries bilaterally. Strongly dominant left vertebral artery noted. Disc levels: C2-C3: Minimal disc bulge. Mild bilateral facet hypertrophy. No canal or foraminal stenosis. C3-C4: Diffuse disc bulge with bilateral uncovertebral hypertrophy. Moderate left worse than right facet and ligament flavum hypertrophy. Resultant mild spinal stenosis without significant cord deformity. Moderate to severe left with moderate right C4 foraminal stenosis, progressed from previous. C4-C5: Anterolisthesis. Mild diffuse disc bulge with disc desiccation. Broad posterior disc osteophyte flattens and partially effaces the ventral thecal sac. Superimposed moderate left worse than  right facet hypertrophy. Resultant mild spinal stenosis without cord impingement. Moderate bilateral C5 foraminal stenosis. C5-C6: Degenerative intervertebral disc space narrowing with diffuse disc osteophyte complex. Flattening and partial effacement of the ventral thecal sac, slightly greater on the left. Mild spinal stenosis with mild flattening of the ventral cord, greater on the left. No cord signal changes. No more than mild bilateral C6 foraminal narrowing. Appearance is similar. C6-C7: Degenerative intervertebral disc space narrowing with diffuse disc osteophyte complex. Broad posterior component flattens and effaces the ventral thecal sac. Resultant mild spinal stenosis with minimal cord flattening. No cord signal changes. Moderate left with mild right C7 foraminal stenosis. Appearance is similar to perhaps mildly progressed from previous. C7-T1: Anterolisthesis. Mild disc bulge with uncovertebral hypertrophy. Bilateral facet degeneration. No spinal stenosis. Foramina remain patent. Visualized upper thoracic spine demonstrates multilevel noncompressive disc bulging without significant stenosis. IMPRESSION: 1. Moderate multilevel cervical spondylosis with resultant mild diffuse spinal stenosis at C3-4 through C6-7. Overall, appearance is mildly progressed as compared to 2015. 2. Multifactorial degenerative changes with resultant multilevel foraminal narrowing as above. Notable findings include moderate to severe left with moderate right C4 foraminal stenosis, moderate bilateral C5 foraminal narrowing, with moderate left C7 foraminal stenosis. Electronically Signed   By: Jeannine Boga M.D.   On: 07/30/2020 21:33   US Carotid Bilateral (at [facility] and [facility] only)  Result Date: 07/30/2020 CLINICAL DATA:  TIA. History of hypertension, hyperlipidemia and diabetes. EXAM: BILATERAL CAROTID DUPLEX ULTRASOUND TECHNIQUE: Pearline Cables scale imaging, color Doppler and duplex ultrasound were  performed of bilateral carotid  and vertebral arteries in the neck. COMPARISON:  08/23/2016 FINDINGS: Criteria: Quantification of carotid stenosis is based on velocity parameters that correlate the residual internal carotid diameter with NASCET-based stenosis levels, using the diameter of the distal internal carotid lumen as the denominator for stenosis measurement. The following velocity measurements were obtained: RIGHT ICA: 107/36 cm/sec CCA: 0000000 cm/sec SYSTOLIC ICA/CCA RATIO:  1.6 ECA: 675 cm/sec LEFT ICA: 161/34 cm/sec CCA: [id number] cm/sec SYSTOLIC ICA/CCA RATIO:  2.3 ECA: 62 cm/sec RIGHT CAROTID ARTERY: There is a minimal amount of eccentric echogenic plaque within the right carotid bulb (image 7 and 18), extending to involve the origin and proximal aspects of the right internal carotid artery (image 23), grossly unchanged compared to the 08/2016 examination and again not resulting in elevated peak systolic velocities within the interrogated course the right internal carotid artery to suggest a hemodynamically significant stenosis. RIGHT VERTEBRAL ARTERY:  Antegrade flow LEFT CAROTID ARTERY: There is a minimal moderate amount of eccentric echogenic plaque within the left carotid bulb (image 36 and 49), extending to involve the origin and proximal aspects of the left internal carotid artery (image 55), grossly unchanged compared to the 2018 examination and again resulting in elevated peak systolic velocities within the distal aspect of the left internal carotid artery. Greatest acquired peak systolic velocity within the distal left ICA measures 161 centimeters/second (image 63), previously, greatest acquired peak systolic velocity with the proximal left ICA measured 135 centimeters/second. LEFT VERTEBRAL ARTERY:  Antegrade flow IMPRESSION: 1. Minimal moderate amount of left-sided atherosclerotic plaque, morphologically similar to the 2018 examination and again results in elevated peak systolic velocities within the left internal carotid artery  compatible with the 50-69% luminal narrowing range. Further evaluation with CTA could be performed as clinically indicated. 2. Minimal amount of right-sided atherosclerotic plaque, not resulting in a hemodynamically significant stenosis. Electronically Signed   By: Sandi Mariscal M.D.   On: 07/30/2020 07:49   ECHOCARDIOGRAM COMPLETE  Result Date: 07/30/2020    ECHOCARDIOGRAM REPORT   Patient Name:   [name] [name] Date of Exam: 07/30/2020 Medical Rec #:  [id number]        Height:       59.0 in Accession #:    [id number]       Weight:       133.0 lb Date of Birth:  [birth year]        BSA:          1.551 m Patient Age:    79 years         BP:           1456/64 mmHg Patient Gender: F                HR:           63 bpm. Exam Location:  [facility] Procedure: 2D Echo, Color Doppler and Cardiac Doppler Indications:     TIA 435.9  History:         Patient has prior history of Echocardiogram examinations, most                  recent 12/18/2018. Risk Factors:Hypertension, Diabetes and                  Dyslipidemia.  Sonographer:     Sherrie Sport RDCS (AE) Referring Phys:  [id number] Arvella Merles MANSY Diagnosing Phys: Kathlyn Sacramento MD IMPRESSIONS  1. Left ventricular ejection fraction, by estimation, is 55 to 60%. The left  ventricle has normal function. The left ventricle has no regional wall motion abnormalities. There is mild left ventricular hypertrophy. Left ventricular diastolic parameters are indeterminate.  2. Right ventricular systolic function is normal. The right ventricular size is normal.  3. The mitral valve is normal in structure. No evidence of mitral valve regurgitation. No evidence of mitral stenosis.  4. The aortic valve is normal in structure. Aortic valve regurgitation is not visualized. No aortic stenosis is present.  5. The inferior vena cava is dilated in size with >50% respiratory variability, suggesting right atrial pressure of 8 mmHg. Conclusion(s)/Recommendation(s): No intracardiac source of embolism detected on  this transthoracic study. FINDINGS  Left Ventricle: Left ventricular ejection fraction, by estimation, is 55 to 60%. The left ventricle has normal function. The left ventricle has no regional wall motion abnormalities. The left ventricular internal cavity size was normal in size. There is  mild left ventricular hypertrophy. Left ventricular diastolic parameters are indeterminate. Right Ventricle: The right ventricular size is normal. No increase in right ventricular wall thickness. Right ventricular systolic function is normal. Left Atrium: Left atrial size was normal in size. Right Atrium: Right atrial size was normal in size. Pericardium: There is no evidence of pericardial effusion. Mitral Valve: The mitral valve is normal in structure. No evidence of mitral valve regurgitation. No evidence of mitral valve stenosis. Tricuspid Valve: The tricuspid valve is normal in structure. Tricuspid valve regurgitation is not demonstrated. No evidence of tricuspid stenosis. Aortic Valve: The aortic valve is normal in structure. Aortic valve regurgitation is not visualized. No aortic stenosis is present. Aortic valve mean gradient measures 4.0 mmHg. Aortic valve peak gradient measures 7.5 mmHg. Aortic valve area, by VTI measures 2.00 cm. Pulmonic Valve: The pulmonic valve was normal in structure. Pulmonic valve regurgitation is not visualized. No evidence of pulmonic stenosis. Aorta: The aortic root is normal in size and structure. Venous: The inferior vena cava is dilated in size with greater than 50% respiratory variability, suggesting right atrial pressure of 8 mmHg. IAS/Shunts: No atrial level shunt detected by color flow Doppler.  LEFT VENTRICLE PLAX 2D LVIDd:         3.77 cm  Diastology LVIDs:         2.63 cm  LV e' medial:    6.85 cm/s LV PW:         1.13 cm  LV E/e' medial:  9.6 LV IVS:        1.13 cm  LV e' lateral:   6.09 cm/s LVOT diam:     2.00 cm  LV E/e' lateral: 10.8 LV SV:         63 LV SV Index:   40 LVOT  Area:     3.14 cm  RIGHT VENTRICLE RV Basal diam:  2.52 cm RV S prime:     11.60 cm/s TAPSE (M-mode): 3.7 cm LEFT ATRIUM             Index       RIGHT ATRIUM           Index LA diam:        2.90 cm 1.87 cm/m  RA Area:     11.10 cm LA Vol (A2C):   80.8 ml 52.10 ml/m RA Volume:   22.40 ml  14.44 ml/m LA Vol (A4C):   38.1 ml 24.57 ml/m LA Biplane Vol: 61.5 ml 39.66 ml/m  AORTIC VALVE  PULMONIC VALVE AV Area (Vmax):    1.96 cm    PV Vmax:       0.71 m/s AV Area (Vmean):   1.57 cm    PV Peak grad:  2.0 mmHg AV Area (VTI):     2.00 cm AV Vmax:           137.33 cm/s AV Vmean:          91.833 cm/s AV VTI:            0.312 m AV Peak Grad:      7.5 mmHg AV Mean Grad:      4.0 mmHg LVOT Vmax:         85.70 cm/s LVOT Vmean:        46.000 cm/s LVOT VTI:          0.199 m LVOT/AV VTI ratio: 0.64  AORTA Ao Root diam: 2.30 cm MITRAL VALVE               TRICUSPID VALVE MV Area (PHT): 2.77 cm    TR Peak grad:   12.4 mmHg MV Decel Time: 274 msec    TR Vmax:        176.00 cm/s MV E velocity: 65.50 cm/s MV A velocity: 90.20 cm/s  SHUNTS MV E/A ratio:  0.73        Systemic VTI:  0.20 m                            Systemic Diam: 2.00 cm Kathlyn Sacramento MD Electronically signed by Kathlyn Sacramento MD Signature Date/Time: 07/30/2020/12:21:37 PM    Final    CT HEAD CODE STROKE WO CONTRAST  Result Date: 07/30/2020 CLINICAL DATA:  Code stroke. Initial evaluation for acute stroke, left-sided numbness. EXAM: CT HEAD WITHOUT CONTRAST TECHNIQUE: Contiguous axial images were obtained from the base of the skull through the vertex without intravenous contrast. COMPARISON:  Prior MRI from 08/23/2016. FINDINGS: Brain: Cerebral volume stable, and remains within normal limits for age. Patchy hypodensity involving the supratentorial cerebral white matter noted, nonspecific, but overall grossly similar and stable from previous. No acute intracranial hemorrhage. No acute large vessel territory infarct. No mass lesion, midline  shift or mass effect. No hydrocephalus or extra-axial fluid collection. Vascular: No hyperdense vessel. Skull: Scalp soft tissues and calvarium within normal limits. Sinuses/Orbits: Globes and orbital soft tissues demonstrate no acute finding. Sequelae of prior left orbital floor repair noted. Scattered mucosal thickening noted within the ethmoidal air cells. Paranasal sinuses are otherwise clear. No mastoid effusion. Other: None. ASPECTS [facility] Stroke Program Early CT Score) - Ganglionic level infarction (caudate, lentiform nuclei, internal capsule, insula, M1-M3 cortex): 7 - Supraganglionic infarction (M4-M6 cortex): 3 Total score (0-10 with 10 being normal): 10 IMPRESSION: 1. No acute intracranial infarct or other abnormality. 2. ASPECTS is 10. 3. Patchy nonspecific supratentorial cerebral white matter disease, nonspecific, but overall grossly similar to previous exams. Critical Value/emergent results were called by telephone at the time of interpretation on 07/29/2020 at 11:58 pm to provider Rudene Re , who verbally acknowledged these results. Electronically Signed   By: Jeannine Boga M.D.   On: 07/30/2020 00:01    Medications:  I have reviewed the patient's current medications. Scheduled: .  stroke: mapping our early stages of recovery book   Does not apply Once  . ascorbic acid  250 mg Oral BID  . aspirin EC  81 mg Oral Daily  . atorvastatin  80 mg Oral Daily  . carvedilol  12.5 mg Oral BID WC  . clopidogrel  75 mg Oral Daily  . enoxaparin (LOVENOX) injection  40 mg Subcutaneous Q24H  . ferrous sulfate  325 mg Oral BID WC  . gabapentin  300 mg Oral QHS  . insulin aspart  0-9 Units Subcutaneous TID AC & HS  . levETIRAcetam  500 mg Oral Daily  . losartan  100 mg Oral Daily  . pantoprazole  40 mg Oral Daily  . traZODone  25 mg Oral QHS    Assessment/Plan: 79 y.o. female with a known history ofDM, seizure disorder, hypertension, dyslipidemia and stage III chronic kidney  disease, GERD and sleep apnea, who presented to the emergency room with acute onset of left arm weakness and numbness. Symptoms now resolved.  MRI of the brain personally reviewed and shows no acute changes.  Small vessel ischemic changes are apparent.  Patient on ASA and a statin prior to presentation.   Differential includes TIA and MR negative infarct.  With kyphosis and arthritic changes on examination, can not rule out a cervical etiology as well.  MRI of the cervical spine personally reviewed and shows multiple levels of cervical spondylosis and degenerative changes, at times more prominent on the left.     Carotid dopplers show 0000000 LICA stenosis, right shows no evidence of hemodynamically significant stenosis.  Echocardiogram with EF of 55-60% and no evidence of cardiac source of emboli noted.  A1c 6.7, LDL 51.  Recommendations: 1. With unclear etiology of presentation would not start DUAP but change ASA to Plavix 75mg  daily 2. Follow dopplers yearly for left ICA stenosis that appears to currently be asymptomatic.  3. Patient has been seeing a physician at [facility] for her cervical spondylosis.  May continue follow up on an outpatient basis.   4. Continue statin  No further neurologic intervention is recommended at this time.  If further questions arise, please call or page at that time.  Thank you for allowing neurology to participate in the care of this patient.    LOS: 0 days   Alexis Goodell, MD Neurology  07/31/2020  11:43 AM

## 2020-07-31 NOTE — TOC Transition Note (Signed)
Transition of Care [facility]) - CM/SW Discharge Note   Patient Details  Name: [name] [name] MRN: [id number] Date of Birth: 1940-08-27  [facility] CM/SW Contact:  Shelbie Hutching, RN Phone Number: 07/31/2020, 12:32 PM   Clinical Narrative:    Patient medically cleared for discharge home today.  Patient wants to go to outpatient physical therapy and already has a referral from her PCP office.  No discharge needs identified.   Final next level of care: OP Rehab Barriers to Discharge: Barriers Resolved   Patient Goals and CMS Choice Patient states their goals for this hospitalization and ongoing recovery are:: To go home and follow up with OP PT CMS Medicare.gov Compare Post Acute Care list provided to:: Patient Choice offered to / list presented to : Patient  Discharge Placement                       Discharge Plan and Services   Discharge Planning Services: CM Consult Post Acute Care Choice:  (OP PT)          DME Arranged: N/A DME Agency: NA       HH Arranged: Patient Refused [facility]          Social Determinants of Health (SDOH) Interventions     Readmission Risk Interventions No flowsheet data found.

## 2020-10-09 ENCOUNTER — Ambulatory Visit (INDEPENDENT_AMBULATORY_CARE_PROVIDER_SITE_OTHER): Payer: Medicare HMO | Admitting: Dermatology

## 2020-10-09 ENCOUNTER — Other Ambulatory Visit: Payer: Self-pay

## 2020-10-09 DIAGNOSIS — L905 Scar conditions and fibrosis of skin: Secondary | ICD-10-CM | POA: Diagnosis not present

## 2020-10-09 DIAGNOSIS — I87302 Chronic venous hypertension (idiopathic) without complications of left lower extremity: Secondary | ICD-10-CM | POA: Diagnosis not present

## 2020-10-09 DIAGNOSIS — I8392 Asymptomatic varicose veins of left lower extremity: Secondary | ICD-10-CM | POA: Diagnosis not present

## 2020-10-09 NOTE — Progress Notes (Signed)
   Follow-Up Visit   Subjective  [name] [name] is a 80 y.o. female who presents for the following: Knot (Left lower leg, noticed the end of December. She has pain on the left lateral calf that started 1 1/2 weeks ago.). Patient states she has a history of melanoma of the left medial calf treated 50+ years ago.  The following portions of the chart were reviewed this encounter and updated as appropriate:       Review of Systems:  No other skin or systemic complaints except as noted in HPI or Assessment and Plan.  Objective  Well appearing patient in no apparent distress; mood and affect are within normal limits.  A focused examination was performed including lower legs. Relevant physical exam findings are noted in the Assessment and Plan.  Objective  Left Medial Upper Calf: Scar  Objective  Left Lower Leg: 2+ pitting edema of the ankles and soft indistinct swelling of the left medial calf with 1+ pitting edema.   Assessment & Plan   Varicose Veins - Dilated blue, purple or red veins at the L lateral lower leg with associated pain - Reassured - start daily compression hose  Scar Left Medial Upper Calf  Hx of melanoma many years ago per patient. Clear. Observe for recurrence. Call clinic for new or changing lesions.  Recommend regular skin exams, daily broad-spectrum spf 30+ sunscreen use, and photoprotection.      Stasis edema of left lower extremity Left Lower Leg  Vs Lipoma (less likely) of the left medial calf.  Stasis in the legs causes chronic leg swelling, which may result in itchy or painful rashes, skin discoloration, skin texture changes, and sometimes ulceration.  Recommend daily compression hose/stockings- easiest to put on first thing in morning, remove at bedtime.  Elevate legs as much as possible. Avoid salt/sodium rich foods.  Patient will discuss leg swelling with primary care doctor. Consider increasing diuretic (furosemide).  Return if symptoms  worsen or fail to improve.   IJamesetta Orleans, CMA, am acting as scribe for Brendolyn Patty, MD .  Documentation: I have reviewed the above documentation for accuracy and completeness, and I agree with the above.  Brendolyn Patty MD

## 2020-10-09 NOTE — Patient Instructions (Addendum)
Recommend compression knee highs for swelling of the lower legs. May be measured and purchased at [place]. Put on first thing in the morning, and take off before bed.  Stasis in the legs causes chronic leg swelling, which may result in itchy or painful rashes, skin discoloration, skin texture changes, and sometimes ulceration.  Recommend daily compression hose/stockings- easiest to put on first thing in morning, remove at bedtime.  Elevate legs as much as possible. Avoid salt/sodium rich foods.   Swelling in leg vs possible lipoma.  Discuss leg swelling with primary care doctor. Consider increasing diuretic (furosemide).

## 2020-11-23 ENCOUNTER — Encounter: Payer: Self-pay | Admitting: Emergency Medicine

## 2020-11-23 ENCOUNTER — Other Ambulatory Visit: Payer: Self-pay

## 2020-11-23 ENCOUNTER — Emergency Department: Payer: Medicare HMO

## 2020-11-23 ENCOUNTER — Emergency Department
Admission: EM | Admit: 2020-11-23 | Discharge: 2020-11-23 | Disposition: A | Discharge status: Home / Self Care | Payer: Medicare HMO | Attending: Emergency Medicine | Admitting: Emergency Medicine

## 2020-11-23 DIAGNOSIS — B9689 Other specified bacterial agents as the cause of diseases classified elsewhere: Secondary | ICD-10-CM | POA: Insufficient documentation

## 2020-11-23 DIAGNOSIS — N183 Chronic kidney disease, stage 3 unspecified: Secondary | ICD-10-CM | POA: Diagnosis not present

## 2020-11-23 DIAGNOSIS — Z7984 Long term (current) use of oral hypoglycemic drugs: Secondary | ICD-10-CM | POA: Diagnosis not present

## 2020-11-23 DIAGNOSIS — Z859 Personal history of malignant neoplasm, unspecified: Secondary | ICD-10-CM | POA: Diagnosis not present

## 2020-11-23 DIAGNOSIS — E1122 Type 2 diabetes mellitus with diabetic chronic kidney disease: Secondary | ICD-10-CM | POA: Diagnosis not present

## 2020-11-23 DIAGNOSIS — Z79899 Other long term (current) drug therapy: Secondary | ICD-10-CM | POA: Insufficient documentation

## 2020-11-23 DIAGNOSIS — I129 Hypertensive chronic kidney disease with stage 1 through stage 4 chronic kidney disease, or unspecified chronic kidney disease: Secondary | ICD-10-CM | POA: Insufficient documentation

## 2020-11-23 DIAGNOSIS — Z7902 Long term (current) use of antithrombotics/antiplatelets: Secondary | ICD-10-CM | POA: Insufficient documentation

## 2020-11-23 DIAGNOSIS — J329 Chronic sinusitis, unspecified: Secondary | ICD-10-CM | POA: Diagnosis not present

## 2020-11-23 DIAGNOSIS — R0981 Nasal congestion: Secondary | ICD-10-CM | POA: Diagnosis present

## 2020-11-23 DIAGNOSIS — R059 Cough, unspecified: Secondary | ICD-10-CM | POA: Insufficient documentation

## 2020-11-23 MED ORDER — CEFDINIR 300 MG PO CAPS
300.0000 mg | ORAL_CAPSULE | Freq: Two times a day (BID) | ORAL | Status: DC
  Administered 2020-11-23: 300 mg via ORAL
  Filled 2020-11-23: qty 1

## 2020-11-23 MED ORDER — DOXYCYCLINE HYCLATE 100 MG PO TABS: 100.0000 mg | ORAL_TABLET | Freq: Once | ORAL | Status: DC

## 2020-11-23 MED ORDER — CEFDINIR 300 MG PO CAPS: 300.0000 mg | ORAL_CAPSULE | Freq: Two times a day (BID) | ORAL | 0 refills | Status: DC

## 2020-11-23 MED ORDER — LEVOCETIRIZINE DIHYDROCHLORIDE 5 MG PO TABS: 5.0000 mg | ORAL_TABLET | Freq: Every morning | ORAL | 0 refills | Status: AC

## 2020-11-23 MED ORDER — DOXYCYCLINE HYCLATE 100 MG PO TABS: 100.0000 mg | ORAL_TABLET | Freq: Two times a day (BID) | ORAL | 0 refills | Status: DC

## 2020-11-23 MED ORDER — PSEUDOEPH-BROMPHEN-DM 30-2-10 MG/5ML PO SYRP: 10.0000 mL | ORAL_SOLUTION | Freq: Four times a day (QID) | ORAL | 0 refills | Status: AC | PRN

## 2020-11-23 NOTE — ED Notes (Addendum)
Patient reports congestion, nasal drainage, congestion, and headache x several days. Patient reports using nasal spray without improvement. Patient denies SOB.

## 2020-11-23 NOTE — ED Provider Notes (Signed)
[facility] Emergency Department Provider Note  ____________________________________________  Time seen: Approximately 3:34 PM  I have reviewed the triage vital signs and the nursing notes.   HISTORY  Chief Complaint Cough, Nasal Congestion, and Headache    HPI [name] [name] is a 80 y.o. female who presents emergency department complaining of nasal congestion, sinus pressure, frontal headache, dry cough.  Patient states that she has had some mild postnasal drip and dry cough for approximately 3 weeks.  Over the last week she has developed increasing nasal congestion, sinus pressure and headache.  No fevers or chills.  No difficulty breathing or swallowing.  Patient denies any sore throat, chest pain, difficulty breathing.  No abdominal complaints.  Medical history as described below with no complaints of chronic medical issues.   Patient has been taking generic Flonase and an over-the-counter antihistamine which appears to be either Claritin or Zyrtec.  She states that this helps somewhat with symptoms but at this point it is only lasting a couple of hours before symptoms come back.        Past Medical History:  Diagnosis Date  . Anemia   . Arthritis   . Cancer ([place])    melanoma  . Chronic kidney disease    stage 3  . Complication of anesthesia    sometimes hard to wake up  . Diabetes ([place])    diet controlled  . 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 GERD (gastroesophageal reflux disease)   . Headache   . Hyperlipemia   . Hypertension   . Pain    chronic  . Rosacea   . 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 Sleep apnea    cpap    Patient Active Problem List   Diagnosis Date Noted  . Left arm numbness 07/30/2020  . Aftercare 07/20/2018  . Pain in right knee 06/22/2018  . Sleeping difficulty 06/22/2018  . History of seizure 07/15/2017  . Insomnia 06/16/2017  . Chest pain 08/23/2016  . Weakness of left  upper extremity 08/23/2016  . 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 Piriformis syndrome of right side 08/24/2015  . Impingement syndrome of right shoulder 07/26/2015  . Chronic right shoulder pain 02/07/2015  . Difficulty in walking 09/22/2014  . Diarrhea 09/15/2014  . Lumbosacral spondylosis 12/26/2013  . DDD (degenerative disc disease), lumbar 12/26/2013  . 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 Arthritis, lumbar spine 12/26/2013  . Diabetes ([place]) 11/18/2013  . Dyslipidemia 11/18/2013  . Convulsions, epileptic ([place]) 11/18/2013  . BP (high blood pressure) 11/18/2013  . Obstructive apnea 11/18/2013  . Arthropathy of cervical facet joint 04/27/2012  . Pain in shoulder 03/25/2012  . Cervical spondylosis without myelopathy 12/31/2011  . Cervical pain 12/31/2011    Past Surgical History:  Procedure Laterality Date  . ABDOMINAL HYSTERECTOMY    . BACK SURGERY     lumbar fusion, laminectomy  . BLEPHAROPLASTY Bilateral 10/24/14   Dr. Loni Muse. Vickki Muff, MBSC  . BREAST SURGERY     reduction  . CHOLECYSTECTOMY    . COLONOSCOPY WITH PROPOFOL N/A 03/17/2016   Procedure: COLONOSCOPY WITH PROPOFOL;  Surgeon: Manya Silvas, MD;  Location: [facility] ENDOSCOPY;  Service: Endoscopy;  Laterality: N/A;  . rcr    . REDUCTION MAMMAPLASTY Bilateral 1985  . SHOULDER ARTHROSCOPY WITH OPEN ROTATOR CUFF REPAIR Right 06/22/2015   Procedure: SHOULDER ARTHROSCOPY WITH SUBACROMIAL DECOMPRESSION, RELEASE OF LONG HEAD OF BICEPS  TENDON, RESECTION OF DISTAL CLAVICLE;  Surgeon: Leanor Kail, MD;  Location: [facility];  Service: Orthopedics;  Laterality: Right;  DIABETIC - diet controlled CPAP   . SKIN CANCER EXCISION     leg, back    Prior to Admission medications   Medication Sig Start Date End Date Taking? Authorizing Provider  brompheniramine-pseudoephedrine-DM 30-2-10 MG/5ML syrup Take 10 mLs by mouth 4 (four) times daily as needed. 11/23/20  Yes Gordana Kewley, Charline Bills, PA-C  doxycycline  (VIBRA-TABS) 100 MG tablet Take 1 tablet (100 mg total) by mouth 2 (two) times daily. 11/23/20  Yes Antwuan Eckley, Charline Bills, PA-C  levocetirizine (XYZAL) 5 MG tablet Take 1 tablet (5 mg total) by mouth in the morning. 11/23/20  Yes Rhilynn Preyer, Charline Bills, PA-C  acetaminophen (TYLENOL) 500 MG tablet Take 500 mg by mouth every 8 (eight) hours as needed for mild pain.     [provider]  atorvastatin (LIPITOR) 80 MG tablet Take 80 mg by mouth daily.    [provider]  carvedilol (COREG) 12.5 MG tablet Take 12.5 mg by mouth 2 (two) times daily with a meal.    [provider]  clopidogrel (PLAVIX) 75 MG tablet Take 1 tablet (75 mg total) by mouth daily. 08/01/20   Shelly Coss, MD  divalproex (DEPAKOTE) 125 MG DR tablet Take 125 mg by mouth 2 (two) times daily.    [provider]  fluticasone (FLONASE) 50 MCG/ACT nasal spray Place 1 spray into both nostrils daily as needed for allergies or rhinitis.    [provider]  furosemide (LASIX) 20 MG tablet Take 20 mg by mouth daily. 10/18/19   [provider]  gabapentin (NEURONTIN) 300 MG capsule Take 600 mg by mouth 2 (two) times daily.    [provider]  HYDROcodone-acetaminophen (NORCO/VICODIN) 5-325 MG tablet Take 1 tablet by mouth 4 (four) times daily as needed. 07/23/20   [provider]  levETIRAcetam (KEPPRA) 500 MG tablet Take 500 mg by mouth 2 (two) times daily.    [provider]  metFORMIN (GLUCOPHAGE XR) 500 MG 24 hr tablet Take 1 tablet (500 mg total) by mouth daily with breakfast. 07/31/20 07/31/21  Shelly Coss, MD  omeprazole (PRILOSEC) 40 MG capsule Take 40 mg by mouth in the morning and at bedtime.    [provider]  potassium chloride (KLOR-CON) 20 MEQ tablet Take 1 tablet (20 mEq total) by mouth daily. 07/31/20 08/30/20  Shelly Coss, MD  senna (SENOKOT) 8.6 MG TABS tablet Take 1 tablet by mouth in the morning and at bedtime.    [provider]  spironolactone (ALDACTONE) 50 MG tablet Take 50 mg by mouth daily.    [provider]  traZODone (DESYREL) 50 MG tablet Take 25-50 mg by mouth at bedtime.    [provider]    Allergies Contrast media [iodinated diagnostic agents], Tramadol, Adhesive [tape], and Penicillins  Family History  Problem Relation Age of Onset  . Aneurysm Father   . Hypertension Father   . Breast cancer Neg Hx     Social History Social History   Tobacco Use  . Smoking status: Never Smoker  . Smokeless tobacco: Never Used  Vaping Use  . Vaping Use: Never used  Substance Use Topics  . Alcohol use: No  . Drug use: No     Review of Systems  Constitutional: No fever/chills Eyes: No visual changes. No discharge ENT: No upper respiratory complaints. Cardiovascular: no chest pain. Respiratory: no cough. No  SOB. Gastrointestinal: No abdominal pain.  No nausea, no vomiting.  No diarrhea.  No constipation. Musculoskeletal: Negative for musculoskeletal pain. Skin: Negative for rash, abrasions, lacerations, ecchymosis. Neurological: Negative for headaches, focal weakness or numbness.  10 System ROS otherwise negative.  ____________________________________________   PHYSICAL EXAM:  VITAL SIGNS: ED Triage Vitals [11/23/20 1515]  Enc Vitals Group     BP (!) 147/66     Pulse Rate 69     Resp 20     Temp 98.5 F (36.9 C)     Temp Source Oral     SpO2 97 %     Weight 120 lb (54.4 kg)     Height 4\' 11"  (1.499 m)     Head Circumference      Peak Flow      Pain Score 7     Pain Loc      Pain Edu?      Excl. in [place]?      Constitutional: Alert and oriented. Well appearing and in no acute distress. Eyes: Conjunctivae are normal. PERRL. EOMI. Head: Atraumatic. ENT:      Ears: EACs and TMs unremarkable bilaterally      Nose: Positive mild purulent congestion/rhinnorhea.  Turbinates are erythematous and edematous.  Tender to percussion over the ethmoid all  sinuses.  Mild tenderness over the frontal sinuses.  No tenderness over the maxillary sinuses      Mouth/Throat: Mucous membranes are moist.  Oropharynx is nonerythematous and nonedematous.  Uvula is midline Neck: No stridor.   Hematological/Lymphatic/Immunilogical: No cervical lymphadenopathy. Cardiovascular: Normal rate, regular rhythm. Normal S1 and S2.  Good peripheral circulation. Respiratory: Normal respiratory effort without tachypnea or retractions. Lungs CTAB with no wheezing, rales or rhonchi. Good air entry to the bases with no decreased or absent breath sounds. Musculoskeletal: Full range of motion to all extremities. No gross deformities appreciated. Neurologic:  Normal speech and language. No gross focal neurologic deficits are appreciated.  Skin:  Skin is warm, dry and intact. No rash noted. Psychiatric: Mood and affect are normal. Speech and behavior are normal. Patient exhibits appropriate insight and judgement.   ____________________________________________   LABS (all labs ordered are listed, but only abnormal results are displayed)  Labs Reviewed - No data to display ____________________________________________  EKG   ____________________________________________  RADIOLOGY I personally viewed and evaluated these images as part of my medical decision making, as well as reviewing the written report by the radiologist.  ED Provider Interpretation: No acute findings on chest x-ray  DG Chest 2 View  Result Date: 11/23/2020 CLINICAL DATA:  Cough for 3 weeks. EXAM: CHEST - 2 VIEW COMPARISON:  Chest radiograph, 12/17/2018 and older studies. Chest CT, 08/22/2016. FINDINGS: Cardiac silhouette is normal in size. Normal mediastinal and hilar contours. Aortic atherosclerotic calcifications. Mild interstitial prominence most evident at the lung bases. Lungs otherwise clear. No pleural effusion or pneumothorax. Skeletal structures are intact. IMPRESSION: No active cardiopulmonary  disease. Electronically Signed   By: Lajean Manes M.D.   On: 11/23/2020 16:18    ____________________________________________    PROCEDURES  Procedure(s) performed:    Procedures    Medications - No data to display   ____________________________________________   INITIAL IMPRESSION / ASSESSMENT AND PLAN / ED COURSE  Pertinent labs & imaging results that were available during my care of the patient were reviewed by me and considered in my medical decision making (see chart for details).  Review of the [place] CSRS was performed in accordance of the [facility]  prior to dispensing any controlled drugs.           Patient's diagnosis is consistent with bacterial sinusitis.  Patient presented to emergency department with 3 weeks of nonproductive cough, nasal congestion, postnasal drip and nasal sinus pressure.  Patient was tender to percussion over the ethmoidal sinuses and frontal sinuses.  She had associated headache, nasal congestion and ongoing nonproductive cough.  No sore throat.  No fevers or chills.  Differential included viral URI, Covid, bronchitis, influenza, pneumonia, sinus infection..  No indication for labs currently.  Patient be treated with doxycycline as she is allergic to penicillins.  She is on Flonase and I will add Xyzal and Bromfed cough syrup.  Follow-up with primary care as needed.  Patient is given ED precautions to return to the ED for any worsening or new symptoms.     ____________________________________________  FINAL CLINICAL IMPRESSION(S) / ED DIAGNOSES  Final diagnoses:  Bacterial sinusitis      NEW MEDICATIONS STARTED DURING THIS VISIT:  ED Discharge Orders         Ordered    doxycycline (VIBRA-TABS) 100 MG tablet  2 times daily        11/23/20 1629    levocetirizine (XYZAL) 5 MG tablet  Every morning        11/23/20 1629    brompheniramine-pseudoephedrine-DM 30-2-10 MG/5ML syrup  4 times daily PRN        11/23/20 1629               This chart was dictated using voice recognition software/Dragon. Despite best efforts to proofread, errors can occur which can change the meaning. Any change was purely unintentional.    Darletta Moll, PA-C 11/23/20 1630    Nance Pear, MD 11/23/20 1701

## 2020-11-23 NOTE — ED Notes (Signed)
Medication requested from pharmacy.

## 2020-11-23 NOTE — ED Triage Notes (Signed)
Pt comes into the ED via POV c/o headache, nasal congestion and cough x 1 week.  Pt states she has had the cough for several weeks but this week it has gotten much worse and progressed with other symptoms.  Pt ambulatory to triage with even and unlabored respirations.

## 2021-03-07 ENCOUNTER — Ambulatory Visit: Payer: Medicare HMO | Admitting: Dermatology

## 2021-03-07 ENCOUNTER — Other Ambulatory Visit: Payer: Self-pay

## 2021-03-07 DIAGNOSIS — D1721 Benign lipomatous neoplasm of skin and subcutaneous tissue of right arm: Secondary | ICD-10-CM | POA: Diagnosis not present

## 2021-03-07 DIAGNOSIS — R21 Rash and other nonspecific skin eruption: Secondary | ICD-10-CM | POA: Diagnosis not present

## 2021-03-07 MED ORDER — DOXYCYCLINE MONOHYDRATE 100 MG PO CAPS: 100.0000 mg | ORAL_CAPSULE | Freq: Two times a day (BID) | ORAL | 0 refills | Status: AC

## 2021-03-07 NOTE — Progress Notes (Signed)
   Follow-Up Visit   Subjective  [name] [name] is a 80 y.o. female who presents for the following: Rash (Patient here concerning a rash at cheeks. Patient states rash first started in may on left side of face. She states the rash at right cheek started 3 weeks. Patient reports some rough scaly spot. Patient reports she thought it may have been a bug bite while working in yard. /Patient states a knot at right forearm she states that it is similar to knot on right lower leg. ).  Right forearm - knot fatty growth lipoma as long as not painful or growing   Antibiotic to see if it clears   The following portions of the chart were reviewed this encounter and updated as appropriate:  Tobacco  Allergies  Meds  Problems  Med Hx  Surg Hx  Fam Hx       Objective  Well appearing patient in no apparent distress; mood and affect are within normal limits.  A focused examination was performed including face. Relevant physical exam findings are noted in the Assessment and Plan.  right and left mid face Pink plaques  Assessment & Plan  Lipoma of right upper extremity Right Forearm - Anterior  Benign-appearing.  Observation.  Call clinic for new or changing lesions.    Rash and other nonspecific skin eruption right and left mid face  Favor Rosacea vs Granuloma faciale > rosacea plus contact dermatitis  Start doxycycline 100 mg twice a day x 2 weeks  Stop neosporin   Recommend Tacrolimus ointment twice a day but pt defers today due to price  Patient will call if not responding to antibiotic and will send in tacrolimus ointment to [facility]   doxycycline (MONODOX) 100 MG capsule - right and left mid face Take 1 capsule (100 mg total) by mouth 2 (two) times daily for 14 days. Take with food  Return for 6 weeks follow up on rash . I, Ruthell Rummage, CMA, am acting as scribe for Forest Gleason, MD.  Documentation: I have reviewed the above documentation for accuracy and completeness,  and I agree with the above.  Forest Gleason, MD

## 2021-03-07 NOTE — Patient Instructions (Addendum)
If you have doxycycline HYCLATE at home, then stop that one and pick up the new Doxycycline MONOHYDRATE we sent to the pharmacy.    Doxycycline should be taken with food to prevent nausea. Do not lay down for 30 minutes after taking. Be cautious with sun exposure and use good sun protection while on this medication. Pregnant women should not take this medication.   Rosacea is a chronic progressive skin condition usually affecting the face of adults, causing redness and/or acne bumps. It is treatable but not curable. It sometimes affects the eyes (ocular rosacea) as well. It may respond to topical and/or systemic medication and can flare with stress, sun exposure, alcohol, exercise and some foods.  Daily application of broad spectrum spf 30+ sunscreen to face is recommended to reduce flares.  If you have any questions or concerns for your doctor, please call our main line at [phone] and press option 4 to reach your doctor's medical assistant. If no one answers, please leave a voicemail as directed and we will return your call as soon as possible. Messages left after 4 pm will be answered the following business day.   You may also send [place] a message via [place]. We typically respond to MyChart messages within 1-2 business days.  For prescription refills, please ask your pharmacy to contact our office. Our fax number is [phone].  If you have an urgent issue when the clinic is closed that cannot wait until the next business day, you can page your doctor at the number below.    Please note that while we do our best to be available for urgent issues outside of office hours, we are not available 24/7.   If you have an urgent issue and are unable to reach [place], you may choose to seek medical care at your doctor's office, retail clinic, urgent care center, or emergency room.  If you have a medical emergency, please immediately call 911 or go to the emergency department.  Pager Numbers  - Dr.  Nehemiah Massed: [phone]  - Dr. Laurence Ferrari: [phone]  - Dr. Nicole Kindred: [phone]  In the event of inclement weather, please call our main line at [phone] for an update on the status of any delays or closures.  Dermatology Medication Tips: Please keep the boxes that topical medications come in in order to help keep track of the instructions about where and how to use these. Pharmacies typically print the medication instructions only on the boxes and not directly on the medication tubes.   If your medication is too expensive, please contact our office at [phone] option 4 or send [place] a message through [place].   We are unable to tell what your co-pay for medications will be in advance as this is different depending on your insurance coverage. However, we may be able to find a substitute medication at lower cost or fill out paperwork to get insurance to cover a needed medication.   If a prior authorization is required to get your medication covered by your insurance company, please allow [place] 1-2 business days to complete this process.  Drug prices often vary depending on where the prescription is filled and some pharmacies may offer cheaper prices.  The website www.goodrx.com contains coupons for medications through different pharmacies. The prices here do not account for what the cost may be with help from insurance (it may be cheaper with your insurance), but the website can give you the price if you did not use any insurance.  - You  can print the associated coupon and take it with your prescription to the pharmacy.  - You may also stop by our office during regular business hours and pick up a GoodRx coupon card.  - If you need your prescription sent electronically to a different pharmacy, notify our office through [facility] MyChart or by phone at [phone] option 4.  

## 2021-03-17 ENCOUNTER — Encounter: Payer: Self-pay | Admitting: Dermatology

## 2021-03-20 ENCOUNTER — Telehealth: Payer: Self-pay | Admitting: Dermatology

## 2021-03-20 ENCOUNTER — Encounter: Payer: Medicare HMO | Admitting: Dermatology

## 2021-03-20 NOTE — Telephone Encounter (Signed)
[name] [name] called to let Dr. Laurence Ferrari how she is doing.She said her cheeks are still very red and feel like sandpaper. She is currently taking her medication.

## 2021-03-22 ENCOUNTER — Other Ambulatory Visit: Payer: Self-pay | Admitting: Ophthalmology

## 2021-03-22 DIAGNOSIS — H5711 Ocular pain, right eye: Secondary | ICD-10-CM

## 2021-04-04 ENCOUNTER — Ambulatory Visit: Admission: RE | Admit: 2021-04-04 | Payer: Medicare HMO | Source: Ambulatory Visit

## 2021-04-05 ENCOUNTER — Ambulatory Visit
Admission: RE | Admit: 2021-04-05 | Discharge: 2021-04-05 | Disposition: A | Discharge status: Home / Self Care | Payer: Medicare HMO | Source: Ambulatory Visit | Attending: Ophthalmology | Admitting: Ophthalmology

## 2021-04-05 ENCOUNTER — Other Ambulatory Visit: Payer: Self-pay

## 2021-04-05 DIAGNOSIS — H5711 Ocular pain, right eye: Secondary | ICD-10-CM | POA: Diagnosis not present

## 2021-04-05 MED ORDER — IOHEXOL 350 MG/ML SOLN
60.0000 mL | Freq: Once | INTRAVENOUS | Status: AC | PRN
  Administered 2021-04-05: 60 mL via INTRAVENOUS

## 2021-04-24 ENCOUNTER — Ambulatory Visit: Payer: Medicare HMO | Admitting: Dermatology

## 2021-06-17 ENCOUNTER — Other Ambulatory Visit: Payer: Self-pay | Admitting: Internal Medicine

## 2021-06-17 DIAGNOSIS — Z1231 Encounter for screening mammogram for malignant neoplasm of breast: Secondary | ICD-10-CM

## 2021-07-23 ENCOUNTER — Other Ambulatory Visit: Payer: Self-pay | Admitting: Otolaryngology

## 2021-07-25 ENCOUNTER — Other Ambulatory Visit: Payer: Self-pay

## 2021-07-25 ENCOUNTER — Ambulatory Visit
Admission: RE | Admit: 2021-07-25 | Discharge: 2021-07-25 | Disposition: A | Discharge status: Home / Self Care | Payer: Medicare HMO | Source: Ambulatory Visit | Attending: Internal Medicine | Admitting: Internal Medicine

## 2021-07-25 DIAGNOSIS — Z1231 Encounter for screening mammogram for malignant neoplasm of breast: Secondary | ICD-10-CM | POA: Diagnosis present

## 2021-08-21 ENCOUNTER — Encounter (HOSPITAL_BASED_OUTPATIENT_CLINIC_OR_DEPARTMENT_OTHER): Payer: Self-pay

## 2021-08-21 ENCOUNTER — Ambulatory Visit (HOSPITAL_BASED_OUTPATIENT_CLINIC_OR_DEPARTMENT_OTHER): Admit: 2021-08-21 | Payer: Medicare HMO | Admitting: Otolaryngology

## 2021-08-21 SURGERY — DRUG INDUCED SLEEP ENDOSCOPY
Anesthesia: General

## 2021-09-05 ENCOUNTER — Ambulatory Visit: Payer: Medicare HMO | Admitting: Dermatology

## 2021-09-05 ENCOUNTER — Other Ambulatory Visit: Payer: Self-pay

## 2021-09-05 DIAGNOSIS — D1724 Benign lipomatous neoplasm of skin and subcutaneous tissue of left leg: Secondary | ICD-10-CM

## 2021-09-05 DIAGNOSIS — L820 Inflamed seborrheic keratosis: Secondary | ICD-10-CM | POA: Diagnosis not present

## 2021-09-05 DIAGNOSIS — L578 Other skin changes due to chronic exposure to nonionizing radiation: Secondary | ICD-10-CM

## 2021-09-05 DIAGNOSIS — Z85820 Personal history of malignant melanoma of skin: Secondary | ICD-10-CM | POA: Diagnosis not present

## 2021-09-05 NOTE — Progress Notes (Signed)
° °  Follow-Up Visit   Subjective  [name] [name] is a 81 y.o. female who presents for the following: Spot Check (Pt has a new spot on left lower leg x approx 2 weeks that she would like checked. Also a few other spots that are bothersome on back and chest. Hx of melanoma. ). The patient has spots, moles and lesions to be evaluated, some may be new or changing and the patient has concerns that these could be cancer.  The following portions of the chart were reviewed this encounter and updated as appropriate:  Tobacco   Allergies   Meds   Problems   Med Hx   Surg Hx   Fam Hx      Review of Systems: No other skin or systemic complaints except as noted in HPI or Assessment and Plan.  Objective  Well appearing patient in no apparent distress; mood and affect are within normal limits.  A focused examination was performed including chest, back, leg. Relevant physical exam findings are noted in the Assessment and Plan.  Left Lower Leg x 1, back x 1, chest x 2 (4) Erythematous keratotic or waxy stuck-on papule or plaque.   Left Lower Leg Rubbery nodule   Assessment & Plan  Inflamed seborrheic keratosis (4) Left Lower Leg x 1, back x 1, chest x 2 Prior to procedure, discussed risks of blister formation, small wound, skin dyspigmentation, or rare scar following cryotherapy. Recommend Vaseline ointment to treated areas while healing.  Destruction of lesion - Left Lower Leg x 1, back x 1, chest x 2 Complexity: simple   Destruction method: cryotherapy   Informed consent: discussed and consent obtained   Timeout:  patient name, date of birth, surgical site, and procedure verified Lesion destroyed using liquid nitrogen: Yes   Region frozen until ice ball extended beyond lesion: Yes   Outcome: patient tolerated procedure well with no complications   Post-procedure details: wound care instructions given    Lipoma of left lower extremity Left Lower Leg Benign-appearing.  Observation.  Call  clinic for new or changing lesions.  Recommend daily use of broad spectrum spf 30+ sunscreen to sun-exposed areas.   Actinic Damage - chronic, secondary to cumulative UV radiation exposure/sun exposure over time - diffuse scaly erythematous macules with underlying dyspigmentation - Recommend daily broad spectrum sunscreen SPF 30+ to sun-exposed areas, reapply every 2 hours as needed.  - Recommend staying in the shade or wearing long sleeves, sun glasses (UVA+UVB protection) and wide brim hats (4-inch brim around the entire circumference of the hat). - Call for new or changing lesions.  Return in about 1 year (around 09/05/2022) for TBSE.  IHarriett Sine, CMA, am acting as scribe for Sarina Ser, MD. Documentation: I have reviewed the above documentation for accuracy and completeness, and I agree with the above.  Sarina Ser, MD

## 2021-09-05 NOTE — Patient Instructions (Signed)

## 2021-09-09 ENCOUNTER — Encounter: Payer: Self-pay | Admitting: Dermatology

## 2021-10-04 ENCOUNTER — Other Ambulatory Visit: Payer: Self-pay | Admitting: Dermatology

## 2021-10-04 DIAGNOSIS — R21 Rash and other nonspecific skin eruption: Secondary | ICD-10-CM

## 2022-06-23 ENCOUNTER — Other Ambulatory Visit: Payer: Self-pay | Admitting: Internal Medicine

## 2022-06-23 ENCOUNTER — Encounter: Payer: Self-pay | Admitting: Internal Medicine

## 2022-06-23 DIAGNOSIS — Z1231 Encounter for screening mammogram for malignant neoplasm of breast: Secondary | ICD-10-CM

## 2022-07-29 ENCOUNTER — Ambulatory Visit
Admission: RE | Admit: 2022-07-29 | Discharge: 2022-07-29 | Disposition: A | Discharge status: Home / Self Care | Payer: Medicare HMO | Source: Ambulatory Visit | Attending: Internal Medicine | Admitting: Internal Medicine

## 2022-07-29 DIAGNOSIS — Z1231 Encounter for screening mammogram for malignant neoplasm of breast: Secondary | ICD-10-CM | POA: Diagnosis present

## 2022-08-06 ENCOUNTER — Other Ambulatory Visit: Payer: Self-pay | Admitting: Internal Medicine

## 2022-08-06 DIAGNOSIS — R928 Other abnormal and inconclusive findings on diagnostic imaging of breast: Secondary | ICD-10-CM

## 2022-08-06 DIAGNOSIS — N6489 Other specified disorders of breast: Secondary | ICD-10-CM

## 2022-08-13 ENCOUNTER — Ambulatory Visit
Admission: RE | Admit: 2022-08-13 | Discharge: 2022-08-13 | Disposition: A | Discharge status: Home / Self Care | Payer: Medicare HMO | Source: Ambulatory Visit | Attending: Internal Medicine | Admitting: Internal Medicine

## 2022-08-13 DIAGNOSIS — N6489 Other specified disorders of breast: Secondary | ICD-10-CM

## 2022-08-13 DIAGNOSIS — R928 Other abnormal and inconclusive findings on diagnostic imaging of breast: Secondary | ICD-10-CM | POA: Diagnosis present

## 2022-09-11 ENCOUNTER — Ambulatory Visit: Payer: Medicare HMO | Admitting: Dermatology

## 2022-10-15 ENCOUNTER — Other Ambulatory Visit: Payer: Self-pay | Admitting: Neurology

## 2022-10-15 ENCOUNTER — Ambulatory Visit
Admission: RE | Admit: 2022-10-15 | Discharge: 2022-10-15 | Disposition: A | Discharge status: Home / Self Care | Payer: Medicare HMO | Source: Ambulatory Visit | Attending: Neurology | Admitting: Neurology

## 2022-10-15 DIAGNOSIS — L819 Disorder of pigmentation, unspecified: Secondary | ICD-10-CM

## 2022-10-15 DIAGNOSIS — M7989 Other specified soft tissue disorders: Secondary | ICD-10-CM | POA: Diagnosis not present

## 2023-06-08 ENCOUNTER — Other Ambulatory Visit: Payer: Self-pay

## 2023-06-08 ENCOUNTER — Emergency Department: Payer: Medicare HMO

## 2023-06-08 ENCOUNTER — Telehealth: Payer: Self-pay

## 2023-06-08 ENCOUNTER — Emergency Department
Admission: EM | Admit: 2023-06-08 | Discharge: 2023-06-08 | Disposition: A | Discharge status: Home / Self Care | Payer: Medicare HMO | Attending: Emergency Medicine | Admitting: Emergency Medicine

## 2023-06-08 DIAGNOSIS — E1122 Type 2 diabetes mellitus with diabetic chronic kidney disease: Secondary | ICD-10-CM | POA: Insufficient documentation

## 2023-06-08 DIAGNOSIS — R197 Diarrhea, unspecified: Secondary | ICD-10-CM | POA: Diagnosis present

## 2023-06-08 DIAGNOSIS — R1030 Lower abdominal pain, unspecified: Secondary | ICD-10-CM | POA: Diagnosis not present

## 2023-06-08 DIAGNOSIS — N189 Chronic kidney disease, unspecified: Secondary | ICD-10-CM | POA: Insufficient documentation

## 2023-06-08 DIAGNOSIS — I129 Hypertensive chronic kidney disease with stage 1 through stage 4 chronic kidney disease, or unspecified chronic kidney disease: Secondary | ICD-10-CM | POA: Insufficient documentation

## 2023-06-08 LAB — URINALYSIS, ROUTINE W REFLEX MICROSCOPIC
Bacteria, UA: NONE SEEN
Bilirubin Urine: NEGATIVE
Glucose, UA: NEGATIVE mg/dL
Ketones, ur: NEGATIVE mg/dL
Nitrite: NEGATIVE
Protein, ur: NEGATIVE mg/dL
Specific Gravity, Urine: 1.006 (ref 1.005–1.030)
pH: 5 (ref 5.0–8.0)

## 2023-06-08 LAB — COMPREHENSIVE METABOLIC PANEL
ALT: 26 U/L (ref 0–44)
AST: 31 U/L (ref 15–41)
Albumin: 3.9 g/dL (ref 3.5–5.0)
Alkaline Phosphatase: 67 U/L (ref 38–126)
Anion gap: 9 (ref 5–15)
BUN: 14 mg/dL (ref 8–23)
CO2: 25 mmol/L (ref 22–32)
Calcium: 8.4 mg/dL — ABNORMAL LOW (ref 8.9–10.3)
Chloride: 106 mmol/L (ref 98–111)
Creatinine, Ser: 0.62 mg/dL (ref 0.44–1.00)
GFR, Estimated: >60 mL/min (ref >60)
Glucose, Bld: 165 mg/dL — ABNORMAL HIGH (ref 70–99)
Potassium: 2.8 mmol/L — ABNORMAL LOW (ref 3.5–5.1)
Sodium: 140 mmol/L (ref 135–145)
Total Bilirubin: 0.7 mg/dL (ref 0.3–1.2)
Total Protein: 6.5 g/dL (ref 6.5–8.1)

## 2023-06-08 LAB — CBC
HCT: 35.8 % — ABNORMAL LOW (ref 36.0–46.0)
Hemoglobin: 11.7 g/dL — ABNORMAL LOW (ref 12.0–15.0)
MCH: 31.3 pg (ref 26.0–34.0)
MCHC: 32.7 g/dL (ref 30.0–36.0)
MCV: 95.7 fL (ref 80.0–100.0)
Platelets: 171 10*3/uL (ref 150–400)
RBC: 3.74 MIL/uL — ABNORMAL LOW (ref 3.87–5.11)
RDW: 12.5 % (ref 11.5–15.5)
WBC: 5.2 10*3/uL (ref 4.0–10.5)
nRBC: 0 % (ref 0.0–0.2)

## 2023-06-08 LAB — LIPASE, BLOOD: Lipase: 28 U/L (ref 11–51)

## 2023-06-08 MED ORDER — LOPERAMIDE HCL 2 MG PO TABS: 2.0000 mg | ORAL_TABLET | Freq: Four times a day (QID) | ORAL | 0 refills | Status: AC | PRN

## 2023-06-08 MED ORDER — POTASSIUM CHLORIDE CRYS ER 20 MEQ PO TBCR: 20.0000 meq | EXTENDED_RELEASE_TABLET | Freq: Two times a day (BID) | ORAL | 0 refills | Status: AC

## 2023-06-08 MED ORDER — LOPERAMIDE HCL 2 MG PO CAPS
4.0000 mg | ORAL_CAPSULE | Freq: Once | ORAL | Status: AC
  Administered 2023-06-08: 4 mg via ORAL
  Filled 2023-06-08: qty 2

## 2023-06-08 NOTE — Telephone Encounter (Signed)
Patient called in wanting to schedule an office visit. I informed her she is a patient of [facility] and she will have to call them and she was really upset I transfer her instead.

## 2023-06-08 NOTE — ED Triage Notes (Signed)
Pt comes with c/o severe diarrhea and some belly pain to lower stomach. Pt states this past Sunday is when it got worse. Pt states it has been going on for over month. Pt states it is like water. Pt states dec intake.

## 2023-06-08 NOTE — ED Provider Notes (Signed)
[facility] Provider Note    Event Date/Time   First MD Initiated Contact with Patient 06/08/23 1557     (approximate)  History   Chief Complaint: Diarrhea  HPI  [name] [name] is a 82 y.o. female with a past medical history of anemia, CKD, diabetes, gastric reflux, hypertension, hyperlipidemia presents to the emergency department for 1 month of intermittent diarrhea.  Patient states over the past 1 month she has intermittently been experiencing diarrhea and at times lower abdominal pain.  Patient states over the weekend has been worse she thought it was getting better Sunday evening however this morning she again woke up and had 4 or 5 episodes of diarrhea which she describes as very watery.  Denies any vomiting.  States he was having abdominal discomfort however after the diarrhea now she does not have any abdominal pain.  Denies any fever.  Patient has not taken any over-the-counter diarrhea medications.  Physical Exam   Triage Vital Signs: ED Triage Vitals  Encounter Vitals Group     BP 06/08/23 1405 (!) 138/58     Systolic BP Percentile --      Diastolic BP Percentile --      Pulse Rate 06/08/23 1405 64     Resp 06/08/23 1405 18     Temp 06/08/23 1405 98 F (36.7 C)     Temp src --      SpO2 06/08/23 1405 98 %     Weight 06/08/23 1406 135 lb (61.2 kg)     Height 06/08/23 1406 4\' 11"  (1.499 m)     Head Circumference --      Peak Flow --      Pain Score 06/08/23 1404 7     Pain Loc --      Pain Education --      Exclude from Growth Chart --     Most recent vital signs: Vitals:   06/08/23 1405  BP: (!) 138/58  Pulse: 64  Resp: 18  Temp: 98 F (36.7 C)  SpO2: 98%    General: Awake, no distress.  CV:  Good peripheral perfusion.  Regular rate and rhythm  Resp:  Normal effort.  Equal breath sounds bilaterally.  Abd:  No distention.  Soft, nontender.  No rebound or guarding.  Benign abdomen. ED Results / Procedures / Treatments    RADIOLOGY  I have reviewed and interpreted CT images.  I do not see any obvious bowel obstruction or significant abnormality. Radiology has read the CT scan is negative for acute abnormality.   MEDICATIONS ORDERED IN ED: Medications  loperamide (IMODIUM) capsule 4 mg (4 mg Oral Given 06/08/23 1612)     IMPRESSION / MDM / ASSESSMENT AND PLAN / ED COURSE  I reviewed the triage vital signs and the nursing notes.  Patient's presentation is most consistent with acute presentation with potential threat to life or bodily function.  Patient presents emergency department for intermittent diarrhea and abdominal pain.  Overall benign abdomen.  Lab work is reassuring occluding a normal CBC with a normal white blood cell count, reassuring chemistry with a normal anion gap negative LFTs and lipase.  However given the patient's 4 weeks of symptoms now worse we will obtain a CT scan of the abdomen/pelvis to evaluate for possible colitis or diverticulitis.  We will dose Imodium/loperamide while awaiting CT results.  Will attempt to obtain a urinalysis as well.  Patient agreeable to plan of care.  Patient CT scan has been  read as negative by radiology.  Patient's lab work is reassuring with a normal CBC normal chemistry including LFTs and lipase and a normal urinalysis.  Discussed with patient use of loperamide as needed for diarrhea.  Also discussed supportive care including plenty of rest and rehydration.  FINAL CLINICAL IMPRESSION(S) / ED DIAGNOSES   Diarrhea   Note:  This document was prepared using Dragon voice recognition software and may include unintentional dictation errors.   Minna Antis, MD 06/08/23 2034

## 2023-07-07 ENCOUNTER — Encounter: Payer: Self-pay | Admitting: Internal Medicine

## 2023-07-07 DIAGNOSIS — Z1231 Encounter for screening mammogram for malignant neoplasm of breast: Secondary | ICD-10-CM

## 2023-07-15 ENCOUNTER — Other Ambulatory Visit: Payer: Self-pay | Admitting: Internal Medicine

## 2023-07-15 DIAGNOSIS — N644 Mastodynia: Secondary | ICD-10-CM

## 2023-07-24 ENCOUNTER — Ambulatory Visit
Admission: RE | Admit: 2023-07-24 | Discharge: 2023-07-24 | Disposition: A | Discharge status: Home / Self Care | Payer: Medicare HMO | Source: Ambulatory Visit | Attending: Internal Medicine | Admitting: Internal Medicine

## 2023-07-24 DIAGNOSIS — N644 Mastodynia: Secondary | ICD-10-CM

## 2024-04-29 ENCOUNTER — Other Ambulatory Visit: Payer: Self-pay

## 2024-04-29 DIAGNOSIS — N644 Mastodynia: Secondary | ICD-10-CM

## 2024-05-03 ENCOUNTER — Ambulatory Visit
Admission: RE | Admit: 2024-05-03 | Discharge: 2024-05-03 | Disposition: A | Discharge status: Home / Self Care | Source: Ambulatory Visit

## 2024-05-03 DIAGNOSIS — N644 Mastodynia: Secondary | ICD-10-CM | POA: Diagnosis present

## 2024-08-31 ENCOUNTER — Other Ambulatory Visit: Payer: Self-pay | Admitting: Internal Medicine

## 2024-08-31 DIAGNOSIS — Z1231 Encounter for screening mammogram for malignant neoplasm of breast: Secondary | ICD-10-CM

## 2024-09-05 ENCOUNTER — Ambulatory Visit: Admitting: Urology

## 2024-09-23 ENCOUNTER — Encounter

## 2024-10-31 ENCOUNTER — Ambulatory Visit: Admitting: Urology
